# Patient Record
Sex: Male | Born: 1945 | Race: White | Hispanic: No | Marital: Married | Smoking: Never smoker
Health system: Southern US, Community
[De-identification: ages and names within clinical notes are randomized; demographics above are authoritative.]

## PROBLEM LIST (undated history)

## (undated) DIAGNOSIS — E785 Hyperlipidemia, unspecified: Secondary | ICD-10-CM

## (undated) DIAGNOSIS — T23209A Burn of second degree of unspecified hand, unspecified site, initial encounter: Secondary | ICD-10-CM

## (undated) DIAGNOSIS — T24202A Burn of second degree of unspecified site of left lower limb, except ankle and foot, initial encounter: Secondary | ICD-10-CM

## (undated) DIAGNOSIS — I69351 Hemiplegia and hemiparesis following cerebral infarction affecting right dominant side: Secondary | ICD-10-CM

## (undated) DIAGNOSIS — I639 Cerebral infarction, unspecified: Secondary | ICD-10-CM

## (undated) DIAGNOSIS — I10 Essential (primary) hypertension: Secondary | ICD-10-CM

## (undated) DIAGNOSIS — Z9282 Status post administration of tPA (rtPA) in a different facility within the last 24 hours prior to admission to current facility: Secondary | ICD-10-CM

## (undated) DIAGNOSIS — E1165 Type 2 diabetes mellitus with hyperglycemia: Secondary | ICD-10-CM

## (undated) DIAGNOSIS — J45909 Unspecified asthma, uncomplicated: Secondary | ICD-10-CM

## (undated) DIAGNOSIS — R1314 Dysphagia, pharyngoesophageal phase: Secondary | ICD-10-CM

## (undated) DIAGNOSIS — Z91148 Patient's other noncompliance with medication regimen for other reason: Secondary | ICD-10-CM

## (undated) DIAGNOSIS — I4891 Unspecified atrial fibrillation: Secondary | ICD-10-CM

## (undated) DIAGNOSIS — G8191 Hemiplegia, unspecified affecting right dominant side: Secondary | ICD-10-CM

## (undated) DIAGNOSIS — E1159 Type 2 diabetes mellitus with other circulatory complications: Secondary | ICD-10-CM

## (undated) DIAGNOSIS — R471 Dysarthria and anarthria: Secondary | ICD-10-CM

## (undated) DIAGNOSIS — I6330 Cerebral infarction due to thrombosis of unspecified cerebral artery: Secondary | ICD-10-CM

## (undated) DIAGNOSIS — Z91199 Patient's noncompliance with other medical treatment and regimen due to unspecified reason: Secondary | ICD-10-CM

## (undated) DIAGNOSIS — F329 Major depressive disorder, single episode, unspecified: Secondary | ICD-10-CM

## (undated) DIAGNOSIS — E119 Type 2 diabetes mellitus without complications: Secondary | ICD-10-CM

## (undated) HISTORY — DX: Hemiplegia and hemiparesis following cerebral infarction affecting right dominant side: I69.351

## (undated) HISTORY — DX: Burn of second degree of unspecified site of left lower limb, except ankle and foot, initial encounter: T24.202A

## (undated) HISTORY — DX: Unspecified asthma, uncomplicated: J45.909

## (undated) HISTORY — DX: Dysphagia, pharyngoesophageal phase: R13.14

## (undated) HISTORY — DX: Patient's noncompliance with other medical treatment and regimen due to unspecified reason: Z91.199

## (undated) HISTORY — DX: Status post administration of tPA (rtPA) in a different facility within the last 24 hours prior to admission to current facility: Z92.82

## (undated) HISTORY — DX: Burn of second degree of unspecified hand, unspecified site, initial encounter: T23.209A

## (undated) HISTORY — DX: Type 2 diabetes mellitus without complications: E11.9

## (undated) HISTORY — DX: Essential (primary) hypertension: I10

## (undated) HISTORY — DX: Unspecified atrial fibrillation: I48.91

## (undated) HISTORY — DX: Cerebral infarction, unspecified: I63.9

## (undated) HISTORY — DX: Type 2 diabetes mellitus with other circulatory complications: E11.65

## (undated) HISTORY — DX: Type 2 diabetes mellitus with other circulatory complications: E11.59

## (undated) HISTORY — DX: Patient's other noncompliance with medication regimen for other reason: Z91.148

## (undated) HISTORY — PX: NO PAST SURGERIES: SHX2092

## (undated) HISTORY — DX: Hemiplegia, unspecified affecting right dominant side: G81.91

## (undated) HISTORY — DX: Dysarthria and anarthria: R47.1

## (undated) HISTORY — DX: Major depressive disorder, single episode, unspecified: F32.9

## (undated) HISTORY — DX: Hyperlipidemia, unspecified: E78.5

## (undated) HISTORY — DX: Cerebral infarction due to thrombosis of unspecified cerebral artery: I63.30

## (undated) MED FILL — Iron Sucrose Inj 20 MG/ML (Fe Equiv): INTRAVENOUS | Qty: 10 | Status: AC

---

## 1999-09-14 ENCOUNTER — Encounter: Payer: Self-pay | Admitting: Family Medicine

## 1999-09-14 ENCOUNTER — Encounter: Admission: RE | Admit: 1999-09-14 | Discharge: 1999-09-14 | Payer: Self-pay | Admitting: Family Medicine

## 1999-09-17 ENCOUNTER — Encounter: Payer: Self-pay | Admitting: Family Medicine

## 1999-09-17 ENCOUNTER — Ambulatory Visit (HOSPITAL_COMMUNITY): Admission: RE | Admit: 1999-09-17 | Discharge: 1999-09-17 | Payer: Self-pay | Admitting: Family Medicine

## 1999-10-13 ENCOUNTER — Encounter: Payer: Self-pay | Admitting: Neurosurgery

## 1999-10-13 ENCOUNTER — Ambulatory Visit (HOSPITAL_COMMUNITY): Admission: RE | Admit: 1999-10-13 | Discharge: 1999-10-13 | Payer: Self-pay | Admitting: Neurosurgery

## 1999-12-21 ENCOUNTER — Ambulatory Visit (HOSPITAL_COMMUNITY): Admission: RE | Admit: 1999-12-21 | Discharge: 1999-12-21 | Payer: Self-pay | Admitting: Neurosurgery

## 1999-12-21 ENCOUNTER — Encounter: Payer: Self-pay | Admitting: Neurosurgery

## 2007-02-19 ENCOUNTER — Encounter: Admission: RE | Admit: 2007-02-19 | Discharge: 2007-02-19 | Payer: Self-pay | Admitting: Family Medicine

## 2008-09-09 ENCOUNTER — Emergency Department (HOSPITAL_COMMUNITY): Admission: EM | Admit: 2008-09-09 | Discharge: 2008-09-09 | Payer: Self-pay | Admitting: Emergency Medicine

## 2014-11-19 DIAGNOSIS — I10 Essential (primary) hypertension: Secondary | ICD-10-CM | POA: Diagnosis not present

## 2014-11-19 DIAGNOSIS — E119 Type 2 diabetes mellitus without complications: Secondary | ICD-10-CM | POA: Diagnosis not present

## 2014-11-19 DIAGNOSIS — Z0000 Encounter for general adult medical examination without abnormal findings: Secondary | ICD-10-CM | POA: Diagnosis not present

## 2014-11-19 DIAGNOSIS — Z1211 Encounter for screening for malignant neoplasm of colon: Secondary | ICD-10-CM | POA: Diagnosis not present

## 2014-11-19 DIAGNOSIS — Z1389 Encounter for screening for other disorder: Secondary | ICD-10-CM | POA: Diagnosis not present

## 2014-11-19 DIAGNOSIS — E780 Pure hypercholesterolemia: Secondary | ICD-10-CM | POA: Diagnosis not present

## 2014-12-24 DIAGNOSIS — Z1211 Encounter for screening for malignant neoplasm of colon: Secondary | ICD-10-CM | POA: Diagnosis not present

## 2015-05-20 DIAGNOSIS — E119 Type 2 diabetes mellitus without complications: Secondary | ICD-10-CM | POA: Diagnosis not present

## 2015-05-20 DIAGNOSIS — E7800 Pure hypercholesterolemia, unspecified: Secondary | ICD-10-CM | POA: Diagnosis not present

## 2015-05-20 DIAGNOSIS — E559 Vitamin D deficiency, unspecified: Secondary | ICD-10-CM | POA: Diagnosis not present

## 2015-05-20 DIAGNOSIS — I10 Essential (primary) hypertension: Secondary | ICD-10-CM | POA: Diagnosis not present

## 2015-07-09 DIAGNOSIS — H31001 Unspecified chorioretinal scars, right eye: Secondary | ICD-10-CM | POA: Diagnosis not present

## 2015-07-09 DIAGNOSIS — E119 Type 2 diabetes mellitus without complications: Secondary | ICD-10-CM | POA: Diagnosis not present

## 2015-07-09 DIAGNOSIS — H3562 Retinal hemorrhage, left eye: Secondary | ICD-10-CM | POA: Diagnosis not present

## 2015-07-09 DIAGNOSIS — H2513 Age-related nuclear cataract, bilateral: Secondary | ICD-10-CM | POA: Diagnosis not present

## 2015-09-22 DIAGNOSIS — L03012 Cellulitis of left finger: Secondary | ICD-10-CM | POA: Diagnosis not present

## 2015-11-23 DIAGNOSIS — Z0000 Encounter for general adult medical examination without abnormal findings: Secondary | ICD-10-CM | POA: Diagnosis not present

## 2015-11-23 DIAGNOSIS — E7800 Pure hypercholesterolemia, unspecified: Secondary | ICD-10-CM | POA: Diagnosis not present

## 2015-11-23 DIAGNOSIS — E1165 Type 2 diabetes mellitus with hyperglycemia: Secondary | ICD-10-CM | POA: Diagnosis not present

## 2015-11-23 DIAGNOSIS — Z125 Encounter for screening for malignant neoplasm of prostate: Secondary | ICD-10-CM | POA: Diagnosis not present

## 2015-11-23 DIAGNOSIS — Z1211 Encounter for screening for malignant neoplasm of colon: Secondary | ICD-10-CM | POA: Diagnosis not present

## 2015-11-23 DIAGNOSIS — Z1159 Encounter for screening for other viral diseases: Secondary | ICD-10-CM | POA: Diagnosis not present

## 2015-11-23 DIAGNOSIS — Z1389 Encounter for screening for other disorder: Secondary | ICD-10-CM | POA: Diagnosis not present

## 2015-11-23 DIAGNOSIS — I10 Essential (primary) hypertension: Secondary | ICD-10-CM | POA: Diagnosis not present

## 2015-11-23 DIAGNOSIS — E559 Vitamin D deficiency, unspecified: Secondary | ICD-10-CM | POA: Diagnosis not present

## 2016-02-24 DIAGNOSIS — E11319 Type 2 diabetes mellitus with unspecified diabetic retinopathy without macular edema: Secondary | ICD-10-CM | POA: Diagnosis not present

## 2016-02-24 DIAGNOSIS — E1165 Type 2 diabetes mellitus with hyperglycemia: Secondary | ICD-10-CM | POA: Diagnosis not present

## 2016-02-24 DIAGNOSIS — I10 Essential (primary) hypertension: Secondary | ICD-10-CM | POA: Diagnosis not present

## 2016-02-24 DIAGNOSIS — E7800 Pure hypercholesterolemia, unspecified: Secondary | ICD-10-CM | POA: Diagnosis not present

## 2016-05-26 DIAGNOSIS — Z125 Encounter for screening for malignant neoplasm of prostate: Secondary | ICD-10-CM | POA: Diagnosis not present

## 2016-05-26 DIAGNOSIS — E782 Mixed hyperlipidemia: Secondary | ICD-10-CM | POA: Diagnosis not present

## 2016-05-26 DIAGNOSIS — H6123 Impacted cerumen, bilateral: Secondary | ICD-10-CM | POA: Diagnosis not present

## 2016-05-26 DIAGNOSIS — E119 Type 2 diabetes mellitus without complications: Secondary | ICD-10-CM | POA: Diagnosis not present

## 2016-05-26 DIAGNOSIS — S60551A Superficial foreign body of right hand, initial encounter: Secondary | ICD-10-CM | POA: Diagnosis not present

## 2016-06-15 DIAGNOSIS — E119 Type 2 diabetes mellitus without complications: Secondary | ICD-10-CM | POA: Diagnosis not present

## 2016-06-15 DIAGNOSIS — S335XXA Sprain of ligaments of lumbar spine, initial encounter: Secondary | ICD-10-CM | POA: Diagnosis not present

## 2016-06-15 DIAGNOSIS — K047 Periapical abscess without sinus: Secondary | ICD-10-CM | POA: Diagnosis not present

## 2016-06-15 DIAGNOSIS — Z1389 Encounter for screening for other disorder: Secondary | ICD-10-CM | POA: Diagnosis not present

## 2016-07-14 DIAGNOSIS — E113292 Type 2 diabetes mellitus with mild nonproliferative diabetic retinopathy without macular edema, left eye: Secondary | ICD-10-CM | POA: Diagnosis not present

## 2016-11-28 DIAGNOSIS — E782 Mixed hyperlipidemia: Secondary | ICD-10-CM | POA: Diagnosis not present

## 2016-11-28 DIAGNOSIS — E119 Type 2 diabetes mellitus without complications: Secondary | ICD-10-CM | POA: Diagnosis not present

## 2016-11-29 DIAGNOSIS — E782 Mixed hyperlipidemia: Secondary | ICD-10-CM | POA: Diagnosis not present

## 2016-11-29 DIAGNOSIS — E1121 Type 2 diabetes mellitus with diabetic nephropathy: Secondary | ICD-10-CM | POA: Diagnosis not present

## 2017-03-01 DIAGNOSIS — H6123 Impacted cerumen, bilateral: Secondary | ICD-10-CM | POA: Diagnosis not present

## 2017-03-01 DIAGNOSIS — E1121 Type 2 diabetes mellitus with diabetic nephropathy: Secondary | ICD-10-CM | POA: Diagnosis not present

## 2017-03-01 DIAGNOSIS — E782 Mixed hyperlipidemia: Secondary | ICD-10-CM | POA: Diagnosis not present

## 2017-03-08 DIAGNOSIS — H6123 Impacted cerumen, bilateral: Secondary | ICD-10-CM | POA: Diagnosis not present

## 2017-06-26 DIAGNOSIS — E782 Mixed hyperlipidemia: Secondary | ICD-10-CM | POA: Diagnosis not present

## 2017-06-26 DIAGNOSIS — E1121 Type 2 diabetes mellitus with diabetic nephropathy: Secondary | ICD-10-CM | POA: Diagnosis not present

## 2017-07-13 DIAGNOSIS — E119 Type 2 diabetes mellitus without complications: Secondary | ICD-10-CM | POA: Diagnosis not present

## 2017-08-16 DIAGNOSIS — Z0000 Encounter for general adult medical examination without abnormal findings: Secondary | ICD-10-CM | POA: Diagnosis not present

## 2017-08-16 DIAGNOSIS — Z125 Encounter for screening for malignant neoplasm of prostate: Secondary | ICD-10-CM | POA: Diagnosis not present

## 2017-08-16 DIAGNOSIS — Z6832 Body mass index (BMI) 32.0-32.9, adult: Secondary | ICD-10-CM | POA: Diagnosis not present

## 2017-08-16 DIAGNOSIS — Z1211 Encounter for screening for malignant neoplasm of colon: Secondary | ICD-10-CM | POA: Diagnosis not present

## 2017-09-06 DIAGNOSIS — Z1212 Encounter for screening for malignant neoplasm of rectum: Secondary | ICD-10-CM | POA: Diagnosis not present

## 2017-09-06 DIAGNOSIS — Z1211 Encounter for screening for malignant neoplasm of colon: Secondary | ICD-10-CM | POA: Diagnosis not present

## 2017-09-26 DIAGNOSIS — E782 Mixed hyperlipidemia: Secondary | ICD-10-CM | POA: Diagnosis not present

## 2017-09-26 DIAGNOSIS — E1121 Type 2 diabetes mellitus with diabetic nephropathy: Secondary | ICD-10-CM | POA: Diagnosis not present

## 2017-09-26 DIAGNOSIS — Z125 Encounter for screening for malignant neoplasm of prostate: Secondary | ICD-10-CM | POA: Diagnosis not present

## 2018-07-16 DIAGNOSIS — H2513 Age-related nuclear cataract, bilateral: Secondary | ICD-10-CM | POA: Diagnosis not present

## 2018-07-16 DIAGNOSIS — H52221 Regular astigmatism, right eye: Secondary | ICD-10-CM | POA: Diagnosis not present

## 2018-07-16 DIAGNOSIS — E119 Type 2 diabetes mellitus without complications: Secondary | ICD-10-CM | POA: Diagnosis not present

## 2018-07-16 DIAGNOSIS — H5201 Hypermetropia, right eye: Secondary | ICD-10-CM | POA: Diagnosis not present

## 2018-07-16 DIAGNOSIS — H524 Presbyopia: Secondary | ICD-10-CM | POA: Diagnosis not present

## 2018-11-23 DIAGNOSIS — I639 Cerebral infarction, unspecified: Secondary | ICD-10-CM | POA: Insufficient documentation

## 2018-11-23 HISTORY — DX: Cerebral infarction, unspecified: I63.9

## 2018-12-16 DIAGNOSIS — R29705 NIHSS score 5: Secondary | ICD-10-CM | POA: Diagnosis not present

## 2018-12-16 DIAGNOSIS — I700 Atherosclerosis of aorta: Secondary | ICD-10-CM | POA: Diagnosis not present

## 2018-12-16 DIAGNOSIS — R0902 Hypoxemia: Secondary | ICD-10-CM | POA: Diagnosis not present

## 2018-12-16 DIAGNOSIS — E1165 Type 2 diabetes mellitus with hyperglycemia: Secondary | ICD-10-CM | POA: Diagnosis not present

## 2018-12-16 DIAGNOSIS — R29810 Facial weakness: Secondary | ICD-10-CM | POA: Diagnosis not present

## 2018-12-16 DIAGNOSIS — R4781 Slurred speech: Secondary | ICD-10-CM | POA: Diagnosis not present

## 2018-12-16 DIAGNOSIS — R531 Weakness: Secondary | ICD-10-CM | POA: Diagnosis not present

## 2018-12-16 DIAGNOSIS — I6312 Cerebral infarction due to embolism of basilar artery: Secondary | ICD-10-CM | POA: Diagnosis not present

## 2018-12-16 DIAGNOSIS — R42 Dizziness and giddiness: Secondary | ICD-10-CM | POA: Diagnosis not present

## 2018-12-16 DIAGNOSIS — R29818 Other symptoms and signs involving the nervous system: Secondary | ICD-10-CM | POA: Diagnosis not present

## 2018-12-17 ENCOUNTER — Inpatient Hospital Stay (HOSPITAL_COMMUNITY): Payer: Medicare HMO

## 2018-12-17 ENCOUNTER — Inpatient Hospital Stay (HOSPITAL_COMMUNITY)
Admission: AD | Admit: 2018-12-17 | Discharge: 2018-12-24 | DRG: 065 | Disposition: A | Discharge status: Rehabilitation Facility | Payer: Medicare HMO | Source: Other Acute Inpatient Hospital | Attending: Neurology | Admitting: Neurology

## 2018-12-17 ENCOUNTER — Encounter (HOSPITAL_COMMUNITY): Payer: Self-pay

## 2018-12-17 DIAGNOSIS — I6350 Cerebral infarction due to unspecified occlusion or stenosis of unspecified cerebral artery: Secondary | ICD-10-CM | POA: Diagnosis not present

## 2018-12-17 DIAGNOSIS — K5900 Constipation, unspecified: Secondary | ICD-10-CM | POA: Diagnosis not present

## 2018-12-17 DIAGNOSIS — I6312 Cerebral infarction due to embolism of basilar artery: Secondary | ICD-10-CM | POA: Diagnosis not present

## 2018-12-17 DIAGNOSIS — R29710 NIHSS score 10: Secondary | ICD-10-CM | POA: Diagnosis not present

## 2018-12-17 DIAGNOSIS — R739 Hyperglycemia, unspecified: Secondary | ICD-10-CM | POA: Diagnosis not present

## 2018-12-17 DIAGNOSIS — I69392 Facial weakness following cerebral infarction: Secondary | ICD-10-CM | POA: Diagnosis not present

## 2018-12-17 DIAGNOSIS — Z8249 Family history of ischemic heart disease and other diseases of the circulatory system: Secondary | ICD-10-CM

## 2018-12-17 DIAGNOSIS — Z9282 Status post administration of tPA (rtPA) in a different facility within the last 24 hours prior to admission to current facility: Secondary | ICD-10-CM | POA: Diagnosis not present

## 2018-12-17 DIAGNOSIS — E1165 Type 2 diabetes mellitus with hyperglycemia: Secondary | ICD-10-CM | POA: Diagnosis not present

## 2018-12-17 DIAGNOSIS — E11649 Type 2 diabetes mellitus with hypoglycemia without coma: Secondary | ICD-10-CM | POA: Diagnosis not present

## 2018-12-17 DIAGNOSIS — E785 Hyperlipidemia, unspecified: Secondary | ICD-10-CM | POA: Diagnosis not present

## 2018-12-17 DIAGNOSIS — R29810 Facial weakness: Secondary | ICD-10-CM | POA: Diagnosis present

## 2018-12-17 DIAGNOSIS — Z9119 Patient's noncompliance with other medical treatment and regimen: Secondary | ICD-10-CM | POA: Diagnosis not present

## 2018-12-17 DIAGNOSIS — F329 Major depressive disorder, single episode, unspecified: Secondary | ICD-10-CM | POA: Diagnosis not present

## 2018-12-17 DIAGNOSIS — R471 Dysarthria and anarthria: Secondary | ICD-10-CM | POA: Diagnosis present

## 2018-12-17 DIAGNOSIS — I6330 Cerebral infarction due to thrombosis of unspecified cerebral artery: Secondary | ICD-10-CM

## 2018-12-17 DIAGNOSIS — I639 Cerebral infarction, unspecified: Secondary | ICD-10-CM | POA: Diagnosis not present

## 2018-12-17 DIAGNOSIS — E669 Obesity, unspecified: Secondary | ICD-10-CM | POA: Diagnosis present

## 2018-12-17 DIAGNOSIS — Z8042 Family history of malignant neoplasm of prostate: Secondary | ICD-10-CM

## 2018-12-17 DIAGNOSIS — R1314 Dysphagia, pharyngoesophageal phase: Secondary | ICD-10-CM | POA: Diagnosis not present

## 2018-12-17 DIAGNOSIS — R001 Bradycardia, unspecified: Secondary | ICD-10-CM | POA: Diagnosis not present

## 2018-12-17 DIAGNOSIS — Z1159 Encounter for screening for other viral diseases: Secondary | ICD-10-CM

## 2018-12-17 DIAGNOSIS — Z9114 Patient's other noncompliance with medication regimen: Secondary | ICD-10-CM

## 2018-12-17 DIAGNOSIS — Z833 Family history of diabetes mellitus: Secondary | ICD-10-CM

## 2018-12-17 DIAGNOSIS — G8191 Hemiplegia, unspecified affecting right dominant side: Secondary | ICD-10-CM | POA: Diagnosis not present

## 2018-12-17 DIAGNOSIS — I69351 Hemiplegia and hemiparesis following cerebral infarction affecting right dominant side: Secondary | ICD-10-CM | POA: Diagnosis not present

## 2018-12-17 DIAGNOSIS — E871 Hypo-osmolality and hyponatremia: Secondary | ICD-10-CM | POA: Diagnosis present

## 2018-12-17 DIAGNOSIS — Z6829 Body mass index (BMI) 29.0-29.9, adult: Secondary | ICD-10-CM | POA: Diagnosis not present

## 2018-12-17 DIAGNOSIS — I10 Essential (primary) hypertension: Secondary | ICD-10-CM | POA: Diagnosis not present

## 2018-12-17 DIAGNOSIS — Z91199 Patient's noncompliance with other medical treatment and regimen due to unspecified reason: Secondary | ICD-10-CM

## 2018-12-17 DIAGNOSIS — E119 Type 2 diabetes mellitus without complications: Secondary | ICD-10-CM | POA: Diagnosis not present

## 2018-12-17 DIAGNOSIS — Z5329 Procedure and treatment not carried out because of patient's decision for other reasons: Secondary | ICD-10-CM

## 2018-12-17 DIAGNOSIS — I69322 Dysarthria following cerebral infarction: Secondary | ICD-10-CM | POA: Diagnosis not present

## 2018-12-17 DIAGNOSIS — I6389 Other cerebral infarction: Secondary | ICD-10-CM | POA: Diagnosis not present

## 2018-12-17 HISTORY — DX: Cerebral infarction, unspecified: I63.9

## 2018-12-17 LAB — POCT I-STAT, CHEM 8
BUN: 21 mg/dL (ref 8–23)
Calcium, Ion: 1.16 mmol/L (ref 1.15–1.40)
Chloride: 99 mmol/L (ref 98–111)
Creatinine, Ser: 0.7 mg/dL (ref 0.61–1.24)
Glucose, Bld: 394 mg/dL — ABNORMAL HIGH (ref 70–99)
HCT: 40 % (ref 39.0–52.0)
Hemoglobin: 13.6 g/dL (ref 13.0–17.0)
Potassium: 4.3 mmol/L (ref 3.5–5.1)
Sodium: 134 mmol/L — ABNORMAL LOW (ref 135–145)
TCO2: 24 mmol/L (ref 22–32)

## 2018-12-17 LAB — GLUCOSE, CAPILLARY
Glucose-Capillary: 340 mg/dL — ABNORMAL HIGH (ref 70–99)
Glucose-Capillary: 295 mg/dL — ABNORMAL HIGH (ref 70–99)
Glucose-Capillary: 305 mg/dL — ABNORMAL HIGH (ref 70–99)
Glucose-Capillary: 311 mg/dL — ABNORMAL HIGH (ref 70–99)
Glucose-Capillary: 448 mg/dL — ABNORMAL HIGH (ref 70–99)

## 2018-12-17 LAB — LIPID PANEL
Cholesterol: 227 mg/dL — ABNORMAL HIGH (ref 0–200)
HDL: 45 mg/dL (ref >40)
LDL Cholesterol: 166 mg/dL — ABNORMAL HIGH (ref 0–99)
Total CHOL/HDL Ratio: 5 RATIO
Triglycerides: 81 mg/dL (ref <150)
VLDL: 16 mg/dL (ref 0–40)

## 2018-12-17 LAB — BASIC METABOLIC PANEL
Anion gap: 13 (ref 5–15)
BUN: 19 mg/dL (ref 8–23)
CO2: 24 mmol/L (ref 22–32)
Calcium: 8.9 mg/dL (ref 8.9–10.3)
Chloride: 97 mmol/L — ABNORMAL LOW (ref 98–111)
Creatinine, Ser: 0.79 mg/dL (ref 0.61–1.24)
GFR calc Af Amer: >60 mL/min (ref >60)
GFR calc non Af Amer: >60 mL/min (ref >60)
Glucose, Bld: 383 mg/dL — ABNORMAL HIGH (ref 70–99)
Potassium: 3.9 mmol/L (ref 3.5–5.1)
Sodium: 134 mmol/L — ABNORMAL LOW (ref 135–145)

## 2018-12-17 LAB — ECHOCARDIOGRAM COMPLETE BUBBLE STUDY
Height: 71 in
Weight: 3400.38 oz

## 2018-12-17 LAB — MRSA PCR SCREENING: MRSA by PCR: NEGATIVE

## 2018-12-17 MED ORDER — ASPIRIN EC 81 MG PO TBEC
81.0000 mg | DELAYED_RELEASE_TABLET | Freq: Every day | ORAL | Status: DC
  Administered 2018-12-17 – 2018-12-24 (×8): 81 mg via ORAL
  Filled 2018-12-17 (×8): qty 1

## 2018-12-17 MED ORDER — INSULIN ASPART 100 UNIT/ML ~~LOC~~ SOLN: 0.0000 [IU] | Freq: Every day | SUBCUTANEOUS | Status: DC

## 2018-12-17 MED ORDER — ACETAMINOPHEN 650 MG RE SUPP: 650.0000 mg | RECTAL | Status: DC | PRN

## 2018-12-17 MED ORDER — ACETAMINOPHEN 160 MG/5ML PO SOLN: 650.0000 mg | ORAL | Status: DC | PRN

## 2018-12-17 MED ORDER — STROKE: EARLY STAGES OF RECOVERY BOOK
Freq: Once | Status: DC
  Filled 2018-12-17: qty 1

## 2018-12-17 MED ORDER — ACETAMINOPHEN 325 MG PO TABS: 650.0000 mg | ORAL_TABLET | ORAL | Status: DC | PRN

## 2018-12-17 MED ORDER — SENNOSIDES-DOCUSATE SODIUM 8.6-50 MG PO TABS
1.0000 | ORAL_TABLET | Freq: Every evening | ORAL | Status: DC | PRN
  Administered 2018-12-21: 1 via ORAL
  Filled 2018-12-17: qty 1

## 2018-12-17 MED ORDER — PANTOPRAZOLE SODIUM 40 MG IV SOLR
40.0000 mg | Freq: Every day | INTRAVENOUS | Status: DC
  Administered 2018-12-17 – 2018-12-18 (×2): 40 mg via INTRAVENOUS
  Filled 2018-12-17 (×3): qty 40

## 2018-12-17 MED ORDER — INSULIN ASPART 100 UNIT/ML ~~LOC~~ SOLN
0.0000 [IU] | Freq: Three times a day (TID) | SUBCUTANEOUS | Status: DC
  Administered 2018-12-17 – 2018-12-18 (×2): 9 [IU] via SUBCUTANEOUS
  Administered 2018-12-18: 07:00:00 3 [IU] via SUBCUTANEOUS
  Administered 2018-12-18 – 2018-12-20 (×4): 7 [IU] via SUBCUTANEOUS
  Administered 2018-12-20: 5 [IU] via SUBCUTANEOUS
  Administered 2018-12-20: 3 [IU] via SUBCUTANEOUS
  Administered 2018-12-21: 5 [IU] via SUBCUTANEOUS

## 2018-12-17 MED ORDER — CHLORHEXIDINE GLUCONATE CLOTH 2 % EX PADS
6.0000 | MEDICATED_PAD | Freq: Every day | CUTANEOUS | Status: DC
  Administered 2018-12-18: 6 via TOPICAL

## 2018-12-17 MED ORDER — IOHEXOL 350 MG/ML SOLN
75.0000 mL | Freq: Once | INTRAVENOUS | Status: AC | PRN
  Administered 2018-12-17: 75 mL via INTRAVENOUS

## 2018-12-17 MED ORDER — SODIUM CHLORIDE 0.9 % IV SOLN
INTRAVENOUS | Status: DC
  Administered 2018-12-17: 07:00:00 via INTRAVENOUS

## 2018-12-17 NOTE — Evaluation (Signed)
Speech Language Pathology Evaluation Patient Details Name: Robert Brewer MRN: [id number] DOB: [birth year] Today's Date: 12/17/2018 Time: 4967-5916 SLP Time Calculation (min) (ACUTE ONLY): 21 min  Problem List:  Patient Active Problem List   Diagnosis Date Noted  . Ischemic cerebrovascular accident (CVA) ([place]) 12/17/2018   Past Medical History: History reviewed. No pertinent past medical history. Past Surgical History: History reviewed. No pertinent surgical history. HPI:  Pt is a 73 y.o. male with past medical history of diabetes mellitus, HTN not compliant with medications who presented to the emergency department with right-sided weakness and facial droop. CT of the head revealed subtle approximate 1 cm hypodensity involving the left paramedian pons, increased in prominence from previous, and suspicious for evolving acute ischemic infarct. IV tPA was administered and he presented with severe dysarthria when he was seen by neurology.    Assessment / Plan / Recommendation Clinical Impression  Pt expressed that he was living independently with his wife prior to admission. He denied any baseline or new deficits in language or cognition but stated that his speech is now "slurred" with a 50% return to baseline. His language and cognitive-linguistic skills were within normal limits during the assessment. However, he presented with moderate to severe dysarthria secondary to moderate labial and lingual weakness and characterized by reduced articulatory precision, reduced vocal intensity, and a reduced ability to adequately coordinate respiration with speech. Skilled SLP services are clinically indicated at this time to improve motor speech function. Pt, and nursing were educated regarding results and recommendations; both parties verbalized understanding as well as agreement with plan of care.    SLP Assessment  SLP Recommendation/Assessment: Patient needs continued Speech Lanaguage Pathology  Services SLP Visit Diagnosis: Dysarthria and anarthria (R47.1)    Follow Up Recommendations  Other (comment)(Continued SLP at level of care recommended by PT/OT)    Frequency and Duration min 2x/week  2 weeks      SLP Evaluation Cognition  Overall Cognitive Status: Within Functional Limits for tasks assessed Arousal/Alertness: Awake/alert Orientation Level: Oriented X4 Attention: Focused;Sustained Focused Attention: Appears intact Sustained Attention: Appears intact Memory: Appears intact(Immediate: 3/3; Delayed: 3/3) Awareness: Appears intact Problem Solving: Appears intact(5/5) Executive Function: Sequencing Sequencing: Appears intact       Comprehension  Auditory Comprehension Overall Auditory Comprehension: Appears within functional limits for tasks assessed Yes/No Questions: Within Functional Limits Basic Biographical Questions: (5/5) Complex Questions: (5/5) Paragraph Comprehension (via yes/no questions): (4/4) Commands: Within Functional Limits Two Step Basic Commands: (4/4) Multistep Basic Commands: (4/4) Conversation: Complex Visual Recognition/Discrimination Discrimination: Within Function Limits    Expression Expression Primary Mode of Expression: Verbal Verbal Expression Overall Verbal Expression: Appears within functional limits for tasks assessed Initiation: No impairment Automatic Speech: Counting;Day of week;Month of year Level of Generative/Spontaneous Verbalization: Sentence;Conversation Repetition: No impairment(5/5) Naming: No impairment Responsive: (5/5) Confrontation: Within functional limits(10/10) Convergent: (Sentence completion: 5/5) Pragmatics: No impairment   Oral / Motor  Oral Motor/Sensory Function Overall Oral Motor/Sensory Function: Moderate impairment Facial ROM: Reduced right;Suspected CN VII (facial) dysfunction Facial Symmetry: Abnormal symmetry right;Suspected CN VII (facial) dysfunction Facial Strength: Reduced  right;Suspected CN VII (facial) dysfunction Facial Sensation: Within Functional Limits Lingual ROM: Reduced right;Suspected CN XII (hypoglossal) dysfunction Lingual Symmetry: Abnormal symmetry right;Suspected CN XII (hypoglossal) dysfunction Lingual Strength: Reduced;Suspected CN XII (hypoglossal) dysfunction Lingual Sensation: Within Functional Limits Motor Speech Overall Motor Speech: Impaired Respiration: Within functional limits Phonation: Breathy;Low vocal intensity Resonance: Within functional limits Articulation: Impaired Level of Impairment: Sentence Intelligibility: Intelligibility reduced Word: 75-100% accurate Phrase: 50-74%  accurate Sentence: 50-74% accurate Conversation: 25-49% accurate Motor Planning: Witnin functional limits Motor Speech Errors: Not applicable   Robert Brewer, [place], [place] Office number [phone] Pager [phone]                    Horton Marshall 12/17/2018, 3:32 PM

## 2018-12-17 NOTE — H&P (Addendum)
Chief Complaint: Sudden onset right side weakness  And facial droop   History obtained from: Patient and Chart ( from [facility])  HPI:                                                                                                                                       Robert Brewer is a 73 y.o. male with past medical history of diabetes mellitus, HTN not compliant medications presents to the emergency department with right-sided weakness and facial droop that began at 5 PM.  Patient was mowing his lawn when his symptoms started, however his symptoms worsened and his wife called 911.  History obtained by chart review from [facility] notes  Patient was evaluated by teleneurology at [facility] and patient received IV TPA. CT head was performed which showed no hemorrhage and aspects 10.  CT angiogram was performed which was read as negative for large vessel occlusion. 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   Patient arrived to [facility] around 1:30 AM.  On examination patient is alert but has severe dysarthria, right-sided hemiplegia.  NIH stroke scale 10, worse compared to presentation at [facility].  Other Work-up in ED included CBC: No leukocytosis normal platelets (188k).  INR 1.0.  SARS-CoV-2 test negative. Chest x-ray was normal.  EKG shows sinus bradycardia.   Date last known well: 5.24.20 Time last known well: 5 pm tPA Given: Yes NIHSS: 5>>10 Baseline MRS 0  PMH Diabetes mellitus    No family history on file. Family history of DM Social History: denies Smoking  Allergies: Allergies not on file  Medications:                                                                                                                        I reviewed home medications. Not compliant and not taking meds.    ROS:  14 systems reviewed and negative except above    Examination:                                                                                                      General: Appears well-developed  Psych: Affect appropriate to situation Eyes: No scleral injection HENT: No OP obstrucion Head: Normocephalic.  Cardiovascular: Normal rate and regular rhythm.  Respiratory: Effort normal and breath sounds normal to anterior ascultation GI: Soft.  No distension. There is no tenderness.  Skin: WDI    Neurological Examination Mental Status: Alert, oriented, thought content appropriate.  Significant dysarthria.  Language is intact to comprehension, repetition and naming.  Able to follow complex commands. Cranial Nerves: II: Visual fields normal,  III,IV, VI: ptosis not present, extra-ocular motions intact bilaterally, pupils equal, round, reactive to light and accommodation V,VII: Right Facial droop, facial light touch sensation normal bilaterally VIII: hearing normal bilaterally IX,X: uvula rises symmetrically XI: bilateral shoulder shrug XII: tongue extension ? Mildly deviated to right Motor: Right : Upper extremity   0/5    Left:     Upper extremity   5/5  Lower extremity   2/5     Lower extremity   5/5 Tone and bulk:normal tone throughout; no atrophy noted Sensory: Pinprick and light touch intact throughout, bilaterally Deep Tendon Reflexes: 2+ and symmetric throughout Plantars: Right: extensor  Left: downgoing Cerebellar: normal finger-to-nose on left side Gait: Unable to perform due to safety,      Lab Results: Basic Metabolic Panel: No results for input(s): NA, K, CL, CO2, GLUCOSE, BUN, CREATININE, CALCIUM, MG, PHOS in the last 168 hours.  CBC: No results for input(s): WBC, NEUTROABS, HGB, HCT, MCV, PLT in the last 168 hours.  Coagulation  Studies: No results for input(s): LABPROT, INR in the last 72 hours.  Imaging: No results found.  Reviewed CT angiogram, patient has been basilar nonocclusive thrombus.  Discussed this with neuroradiology who felt this was causing about 50 % to 70% stenosis, thrombus likely due to ulcerated plaque.   ASSESSMENT AND PLAN   73 y.o. male with past medical history of diabetes mellitus presents to the emergency department with right-sided weakness and facial droop that began at 5 PM. Patient presented to [facility] as symptoms worsened about 3 hours later, received IV TPA.   Transferred to [facility] on arrival NIH stroke scale had increased to 10.(NIH stroke scale was only 5 according to teleneurology note, per ED mentions patient has dense hemiparesis).  As patient's stroke scale had increased and CTA at outside hospital showed basilar thrombus, we repeated the CT angiogram after confirming creatinine was normal.  No hemorrhage seen on CT, basilar artery remained open.  Acute Ischemic Stroke s/p IV tPA  Plan # MRI of the brain without contrast #Transthoracic Echo  # Hold ASA until 24hr post tPA, 24hr CT scan ordered  #Start or continue Atorvastatin 40 mg/other high intensity statin after passing swallow  # BP goal: permissive HTN upto 180/105 mmHg # HBAIC and Lipid profile # Telemetry monitoring # Frequent neuro checks #  NPO until passes stroke swallow screen   Type II diabetes Mellitus, uncontrolled 342 RBG Sliding scale insulin ordered  Hyponatremia - patient started on normal saline  At risk of AKI due to contrast for CT angiogram -IV fluids, avoid nephrotoxic agents  DVT prophylaxis: SCD Full code  Please page stroke NP  Or  PA  Or MD from 8am -4 pm  as this patient from this time will be  followed by the stroke.   You can look them up on [email or web address]  Password [email or web address]    This patient is neurologically critically ill due to stroke s/p TPA. He is at risk for  significant risk of neurological worsening from cerebral edema,  death from brain herniation, heart failure, hemorrhagic conversion, infection, respiratory failure and seizure. This patient's care requires constant monitoring of vital signs, hemodynamics, respiratory and cardiac monitoring, review of multiple databases, neurological assessment, discussion with family, other specialists and medical decision making of high complexity.  I spent 70  minutes of neurocritical time in the care of this patient.      Eldean Nanna [facility] Pager Number [phone]

## 2018-12-17 NOTE — Progress Notes (Signed)
STROKE TEAM PROGRESS NOTE   HISTORY OF PRESENT ILLNESS (per record) Robert Brewer is a 73 y.o. male with past medical history of diabetes mellitus and HTN but not compliant medications presents to the emergency department with right-sided weakness and facial droop that began at 5 PM.  Patient was mowing his lawn when his symptoms started, however his symptoms worsened and his wife called 911.  History obtained by chart review from [facility] notes  Patient was evaluated by teleneurology at [facility] and patient received IV TPA. CT head was performed which showed no hemorrhage and aspects 10.  CT angiogram was performed which was read as negative for large vessel occlusion. It appears  At 8:50 PM, telemetry neurologist was informed that patient may have non-occlusive mid basilar nonocclusive thrombus.( not informed to Accepting neurologist at [facility]).   Patient arrived to [facility] around 1:30 AM.  On examination patient is alert but has severe dysarthria, right-sided hemiplegia.  NIH stroke scale 10, worse compared to presentation at [facility].  Other Work-up in ED included CBC: No leukocytosis normal platelets (188k).  INR 1.0.  SARS-CoV-2 test negative. Chest x-ray was normal.  EKG shows sinus bradycardia.   Date last known well: 5.24.20 Time last known well: 5 pm tPA Given: Yes [facility] NIHSS: 5>>10 Baseline MRS 0   SUBJECTIVE (INTERVAL HISTORY) His nurse is at bedside. He is declining Conservation officer, nature.  Not compliant with medications. Patient dysarthric but not aphasic, appears to understand that he has had a stroke, explained the cause for the stroke and the need to treat his medical conditions. He still declined any insulin or other medical management.   OBJECTIVE Vitals:   12/17/18 0530 12/17/18 0600 12/17/18 0630 12/17/18 0700  BP: 138/75 (!) 160/96 (!) 141/74 128/66  Pulse: (!) 52 82 61 (!) 57  Resp: 14 (!) 21 15 13   Temp:       SpO2: 96% 96% 94% 92%  Weight:      Height:        CBC:  Recent Labs  Lab 12/17/18 0448  HGB 13.6  HCT 03.5    Basic Metabolic Panel:  Recent Labs  Lab 12/17/18 0448 12/17/18 0454  NA 134* 134*  K 4.3 3.9  CL 99 97*  CO2  --  24  GLUCOSE 394* 383*  BUN 21 19  CREATININE 0.70 0.79  CALCIUM  --  8.9    Lipid Panel:     Component Value Date/Time   CHOL 227 (H) 12/17/2018 0212   TRIG 81 12/17/2018 0212   HDL 45 12/17/2018 0212   CHOLHDL 5.0 12/17/2018 0212   VLDL 16 12/17/2018 0212   LDLCALC 166 (H) 12/17/2018 0212   HgbA1c: No results found for: HGBA1C Urine Drug Screen: No results found for: LABOPIA, COCAINSCRNUR, LABBENZ, AMPHETMU, THCU, LABBARB  Alcohol Level No results found for: ETH  IMAGING  MRI Wo Contrast - pending  CT Head Wo Contrast - pending  Ct Angio Head W Or Wo Contrast 12/17/2018 IMPRESSION:   CT HEAD 1. Subtle approximate 1 cm hypodensity involving the left paramedian pons, increased in prominence from previous, and suspicious for evolving acute ischemic infarct.  2. Otherwise stable appearance of the head with associated age-related cerebral atrophy. No other acute abnormality identified.   CTA HEAD AND NECK Ct Angio Neck W Or Wo Contrast 1. 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 No significant intraluminal thrombus or raised dissection flap identified.  2. No large vessel occlusion. No other hemodynamically significant or correctable stenosis.   CXR - normal at OSH   Transthoracic Echocardiogram  00/00/2020 Pending   EKG - SB at OSH   PHYSICAL EXAM Blood pressure 128/66, pulse (!) 57, temperature 97.7 F (36.5 C), resp. rate 13, height 5\' 11"  (1.803 m), weight 96.4 kg, SpO2 92 %.   Physical exam: Exam: Gen: NAD, conversant, well nourised, obese, well groomed                      CV: RRR, no MRG. No Carotid Bruits. No peripheral edema, warm, nontender Eyes: Conjunctivae clear without exudates or hemorrhage  Neuro: Detailed Neurologic Exam  Speech:    Speech is dysarthric, not aphasic  Cognition:    Following commands Cranial Nerves:    The pupils are equal, round, and reactive to light. Appears to have  right gaze preference but VF intact to finger confrontation, EOMI. Trigeminal sensation is intact and the muscles of mastication are normal. Right facial droop. The palate elevates in the midline. Hearing intact. Voice is normal. Shoulder shrug is normal. The tongue has normal motion without fasciculations.   Coordination:    Normal finger to nose left  Motor Observation:    No asymmetry, no atrophy, and no involuntary movements noted.  Strength:    Right hemiparesis, withdraws to stimuli on the left, withdraws slightly right LE, no withdrawal or movement RUE.      Sensation: intact to LT in the extremitie, denies sensory changes     Reflex Exam:  Toes:    Right upgoing Clonus:    Clonus is absent.           ASSESSMENT/PLAN [name] is a 73 y.o. male with history of diabetes mellitus and HTN but not compliant medications  presenting with right sided weakness and facial droop. tPA Given at [facility]   Stroke:  1 cm hypodensity involving the left paramedian pons - small vessel disease - MRI pending  Resultant  Right hemiparesis and dysarthria  CT head - Subtle approximate 1 cm hypodensity involving the left paramedian pons, increased in prominence from previous, and suspicious for evolving acute ischemic infarct.   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 No significant intraluminal thrombus or raised  dissection flap identified.  Carotid Doppler - CTA neck performed - carotid dopplers not indicated.  2D Echo - pending  Lacey Jensen Virus 2 - negative at OSH.  LDL - 166  HgbA1c - pending  UDS - not performed  VTE prophylaxis - SCDs  Diet - NPO  No antithrombotic prior to admission, now on No antithrombotic S/P tPA  Patient will be counseled to be compliant with his antithrombotic medications  Ongoing aggressive stroke risk factor management  Therapy recommendations:  pending  Disposition:  Pending  Hypertension  Stable . Permissive hypertension (OK if < 220/120) but gradually normalize in 5-7 days . Long-term BP goal normotensive  Hyperlipidemia  Lipid lowering medication PTA:  none  LDL 166, goal < 70  Current lipid lowering medication:None, patient has declined in the past  Continue statin at discharge  Diabetes  HgbA1c pending, goal < 7.0  Uncontrolled  Other Stroke Risk Factors  Advanced age  Obesity, Body mass index is 29.64 kg/m., recommend weight loss, diet and exercise as appropriate   Non compliant with medications   Other Active Problems  Na - 134  Non compliant with medications   Hospital day # 0  This is a 73 year old patient who refuses medications, he has been trying homeopathic treatments as well as lifestyle changes at home, wife said his glucose often runs 600.  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  I had a discussion with patient today, he still declined any insulin, he declines medical management.  Patient needs to be talked to again, I left a message for his wife to discuss.  We will let him know tomorrow to call her.  Personally examined patient and images, and have participated in and made any corrections needed to history, physical, neuro exam,assessment and plan as stated above.  I have personally obtained the history, evaluated lab date, reviewed imaging  studies and agree with radiology interpretations.    Sarina Ill, MD Stroke Neurology   A total of 35 minutes was spent for the care of this patient, spent on counseling patient and family on different diagnostic and therapeutic options, counseling and coordination of care, riskd ans benefits of management, compliance, or risk factor reduction and education.   To contact Stroke Continuity provider, please refer to [email or web address] After hours, contact General Neurology

## 2018-12-17 NOTE — Progress Notes (Signed)
  Speech Language Pathology Treatment: Cognitive-Linquistic(Dysarthria)  Patient Details Name: Robert Brewer MRN: [id number] DOB: [birth year] Today's Date: 12/17/2018 Time: 4917-9150 SLP Time Calculation (min) (ACUTE ONLY): 15 min  Assessment / Plan / Recommendation Clinical Impression  Pt was seen for dysarthria treatment and was cooperative throughout the session. He completed labial and lingual ROM and strengthening exercises with mod cues. He was educated regarding the nature of dysarthria and compensatory strategies for speech intelligibility; he verbalized understanding regarding areas of education. He was able to use compensatory strategies at the word level with 80% accuracy increasing to 100% accuracy with min. cues to overarticulate. At the phrase level he demonstrated 50% accuracy increasing to 100% accuracy with mod-max cues for vocal intensity and overarticulation. SLP will continue to follow.     HPI HPI: Pt is a 73 y.o. male with past medical history of diabetes mellitus, HTN not compliant with medications who presented to the emergency department with right-sided weakness and facial droop. CT of the head revealed subtle approximate 1 cm hypodensity involving the left paramedian pons, increased in prominence from previous, and suspicious for evolving acute ischemic infarct. IV tPA was administered and he presented with severe dysarthria when he was seen by neurology.       SLP Plan  Continue with current plan of care  Patient needs continued Speech Lanaguage Pathology Services    Recommendations                   Follow up Recommendations: Other (comment)(Continued SLP at level of care recommended by PT/OT) SLP Visit Diagnosis: Dysarthria and anarthria (R47.1) Plan: Continue with current plan of care       Ravenna Legore I. Hardin Negus, [place], [place] Office number [phone] Pager [phone]               Robert Brewer 12/17/2018,  3:39 PM

## 2018-12-17 NOTE — Progress Notes (Signed)
PT Cancellation Note  Patient Details Name: Robert Brewer MRN: [id number] DOB: [birth year]   Cancelled Treatment:    Reason Eval/Treat Not Completed: Active bedrest order Will follow.   Marguarite Arbour A Cordell Guercio 12/17/2018, 7:00 AM Wray Kearns, PT, DPT Acute Rehabilitation Services Pager [phone] Office [phone]

## 2018-12-17 NOTE — Progress Notes (Signed)
OT Cancellation Note  Patient Details Name: Robert Brewer MRN: [id number] DOB: 12/24/1945   Cancelled Treatment:    Reason Eval/Treat Not Completed: Active bedrest order.  Will check back,  Lucille Passy, OTR/L [place] Pager [phone] Office [phone]   Lucille Passy M 12/17/2018, 6:17 AM

## 2018-12-17 NOTE — Progress Notes (Signed)
Pt able to go to MRI w/out RN per Dr. Jaynee Eagles

## 2018-12-17 NOTE — Progress Notes (Signed)
SLP Cancellation Note  Patient Details Name: Robert Brewer MRN: [id number] DOB: 27-Jul-1945   Cancelled treatment:       Reason Eval/Treat Not Completed: Patient at procedure or test/unavailable(Pt having Echo at this time. SLP will follow up. )  Kerwin Augustus I. Hardin Negus, [place], [place] Office number [phone] Pager [phone]  Horton Marshall 12/17/2018, 2:15 PM

## 2018-12-17 NOTE — Progress Notes (Signed)
  Echocardiogram 2D Echocardiogram has been performed.  Robert Brewer 12/17/2018, 2:33 PM

## 2018-12-17 NOTE — Progress Notes (Addendum)
CBG this AM is 295.  Pt refused insulin and said he does not take medication.  Notified Dr. Jaynee Eagles

## 2018-12-17 NOTE — Progress Notes (Signed)
Pt's wife stated pt is non-compliant with his diabetes at home.  At one point was controlling his CBG by diet and exercise but gave up and refused to follow 2 different PCP's treatment plans.  Wife said average CBG when he checks it at home is around 600.

## 2018-12-18 LAB — GLUCOSE, CAPILLARY
Glucose-Capillary: 231 mg/dL — ABNORMAL HIGH (ref 70–99)
Glucose-Capillary: 314 mg/dL — ABNORMAL HIGH (ref 70–99)
Glucose-Capillary: 381 mg/dL — ABNORMAL HIGH (ref 70–99)
Glucose-Capillary: 239 mg/dL — ABNORMAL HIGH (ref 70–99)

## 2018-12-18 MED ORDER — INSULIN ASPART 100 UNIT/ML ~~LOC~~ SOLN
2.0000 [IU] | Freq: Once | SUBCUTANEOUS | Status: AC
  Administered 2018-12-18: 2 [IU] via SUBCUTANEOUS

## 2018-12-18 MED ORDER — INSULIN STARTER KIT- PEN NEEDLES (ENGLISH)
1.0000 | Freq: Once | Status: AC
  Administered 2018-12-18: 1
  Filled 2018-12-18: qty 1

## 2018-12-18 MED ORDER — INSULIN GLARGINE 100 UNIT/ML ~~LOC~~ SOLN
15.0000 [IU] | Freq: Every day | SUBCUTANEOUS | Status: DC
  Administered 2018-12-18 – 2018-12-21 (×4): 15 [IU] via SUBCUTANEOUS
  Filled 2018-12-18 (×4): qty 0.15

## 2018-12-18 NOTE — Progress Notes (Signed)
  Speech Language Pathology Treatment: Cognitive-Linquistic(Dysarthria)  Patient Details Name: Robert Brewer MRN: [id number] DOB: 08/10/1945 Today's Date: 12/18/2018 Time: 0300-9233 SLP Time Calculation (min) (ACUTE ONLY): 29 min  Assessment / Plan / Recommendation Clinical Impression  Pt was seen for dysarthria treatment and reported that he has been attempting to use some of the compensatory strategies for speech intelligibility. He was able to recall 2/3 strategies without cues but required min. cues for the third. He completed labial and lingual ROM and strengthening exercises with min-mod cues. He used compensatory strategies at the phrase level with 80% accuracy increasing to 100% accuracy with min. cues to overarticulate. However, moderate-max cues were needed at this level during less structured tasks such as him describing what was happening in photos. Pt participated in a short conversation with the SLP at the end of the session to facilitate generalization of strategies to the conversational level and pt required less support than at the onset of the session for use of compensatory strategies. SLP will continue to follow.    HPI HPI: Pt is a 73 y.o. male with past medical history of diabetes mellitus, HTN not compliant with medications who presented to the emergency department with right-sided weakness and facial droop. CT of the head revealed subtle approximate 1 cm hypodensity involving the left paramedian pons, increased in prominence from previous, and suspicious for evolving acute ischemic infarct. IV tPA was administered and he presented with severe dysarthria when he was seen by neurology.       SLP Plan  Continue with current plan of care       Recommendations                   Follow up Recommendations: Other (comment)(Continued SLP at level of care recommended by PT/OT) SLP Visit Diagnosis: Dysarthria and anarthria (R47.1) Plan: Continue with current plan of  care       Aleysia Oltmann I. Hardin Negus, [place], [place] Office number [phone] Pager [phone]               Horton Marshall 12/18/2018, 9:50 AM

## 2018-12-18 NOTE — Progress Notes (Signed)
Rehab Admissions Coordinator Note:  Patient was screened by Michel Santee, PT, DPT for appropriateness for an Inpatient Acute Rehab Consult.  At this time, we are recommending Inpatient Rehab consult. I will contact MD for order.   Shann Medal, PT, DPT Admissions Coordinator [phone] 12/18/18  2:27 PM

## 2018-12-18 NOTE — Progress Notes (Addendum)
Inpatient Diabetes Program Recommendations  AACE/ADA: New Consensus Statement on Inpatient Glycemic Control (2015)  Target Ranges:  Prepandial:   less than 140 mg/dL      Peak postprandial:   less than 180 mg/dL (1-2 hours)      Critically ill patients:  140 - 180 mg/dL   Lab Results  Component Value Date   GLUCAP 314 (H) 12/18/2018    Review of Glycemic Control Results for Robert Brewer, Robert Brewer (MRN [id number]) as of 12/18/2018 13:17  Ref. Range 12/17/2018 11:46 12/17/2018 15:44 12/17/2018 21:48 12/18/2018 06:00 12/18/2018 12:54  Glucose-Capillary Latest Ref Range: 70 - 99 mg/dL 305 (H) 311 (H) 448 (H) 231 (H) 314 (H)   Diabetes history: DM2 Outpatient Diabetes medications: No home meds listed Current orders for Inpatient glycemic control: Novolog sensitive correction tid  Inpatient Diabetes Program Recommendations:   Noted A1c pending. -Lantus 15 15 (0.15 units/kg x 96.4 kg = 15) -Novolog 4 units tid meal coverage if eats 50%  13:45 Spoke with patient @ bedside and reviewed need for insulin @ this time and patient states willingness to receive insulin injections and follow through with taking insulin @ home if he needs to. States his wife takes 3 insulin injections daily by pen @ home. Updated Dr. Leonie Man that patient is willing to receive insulin injections.  Thank you, Nani Gasser. Sabas Frett, RN, MSN, CDE  Diabetes Coordinator Inpatient Glycemic Control Team Team Pager [phone] (8am-5pm) 12/18/2018 1:19 PM

## 2018-12-18 NOTE — Evaluation (Signed)
Physical Therapy Evaluation Patient Details Name: Robert Brewer MRN: [id number] DOB: 12-Mar-1946 Today's Date: 12/18/2018   History of Present Illness  Pt is a 73 y.o. male with past medical history of diabetes mellitus, HTN not compliant with medications who presented to the emergency department with right-sided weakness and facial droop. CT of the head revealed subtle approximate 1 cm hypodensity involving the left paramedian pons, increased in prominence from previous, and suspicious for evolving acute ischemic infarct. IV tPA was administered and he presented with severe dysarthria when he was seen by neurology.    Clinical Impression  Patient was seen for intial PT evaluation. He presents with significant R UE deficits and strength deficits in the right lower extremity. He required MAX A to take steps to the chair. He has vertigo but can control it with verbal cuing to focus on an object. He required min a to the edge of the bed and min a/ min guard to remain sitting. In standing he shifts to the right, He can correct with verbal and tactile cuing. He would benefit from aggressive rehabilitation at [facility]. Acute therapy will continue to work with the patient.     Follow Up Recommendations CIR    Equipment Recommendations  Rolling walker with 5" wheels;3in1 (PT)    Recommendations for Other Services Rehab consult     Precautions / Restrictions Precautions Precautions: Fall Restrictions Weight Bearing Restrictions: No      Mobility  Bed Mobility Overal bed mobility: Needs Assistance Bed Mobility: Supine to Sit     Supine to sit: Min assist;+2 for physical assistance     General bed mobility comments: Min a+2 to sit up at the edge of the  bed. Min a at first for balance.   Transfers Overall transfer level: Needs assistance Equipment used: Rolling walker (2 wheeled) Transfers: Sit to/from Stand Sit to Stand: Max assist;+2 physical assistance         General transfer  comment: initially mod a x2 but quickly fatigues and then requires max a x2. using RW. Pt may do better this time with squat pivot or stand pivot. Benefits from use of gaze fixation on target to reduce vertigo. Patientl leaned to the right but was able to correct with verbal and tactile cuing.   Ambulation/Gait Ambulation/Gait assistance: Max assist;+2 physical assistance;+2 safety/equipment Gait Distance (Feet): 2 Feet Assistive device: Rolling walker (2 wheeled)   Gait velocity: decreased  Gait velocity interpretation: <1.31 ft/sec, indicative of household ambulator General Gait Details: Patient required physical assist to move right LE from 1 therapist, while 1 therapist helped him maintain balance with mod-> max a. He was able to take 3 steps to the chair. Nursing tech advised to perfrom 2 person stand or squat pivot transfer to get back to bed.   Stairs            Wheelchair Mobility    Modified Rankin (Stroke Patients Only) Modified Rankin (Stroke Patients Only) Pre-Morbid Rankin Score: No symptoms Modified Rankin: Severe disability     Balance Overall balance assessment: Needs assistance Sitting-balance support: Single extremity supported;Feet supported Sitting balance-Leahy Scale: Poor Sitting balance - Comments: close supervision to occassional min a Postural control: Right lateral lean;Posterior lean Standing balance support: Bilateral upper extremity supported Standing balance-Leahy Scale: Poor Standing balance comment: R bias - requires mod a x2 using RW with total assist for RUE on RW. Pt felt safer with RW  Pertinent Vitals/Pain Pain Assessment: 0-10 Pain Score: 3  Pain Location: right shoulder with passive movevment  Pain Descriptors / Indicators: Sore Pain Intervention(s): Limited activity within patient's tolerance;Monitored during session    Home Living Family/patient expects to be discharged to:: Private  residence Living Arrangements: Spouse/significant other Available Help at Discharge: Family;Available 24 hours/day Type of Home: House Home Access: Stairs to enter   [employer] of Steps: 1 step to back porch no railing Home Layout: Two level Home Equipment: Cane - single point      Prior Function Level of Independence: Independent         Comments: Patient reports he has a cane at home that his wife used sometimes but he dosent. He sleeps in a recliner but only because he stays up late watching TV.      Hand Dominance   Dominant Hand: Right    Extremity/Trunk Assessment   Upper Extremity Assessment Upper Extremity Assessment: Defer to OT evaluation RUE Deficits / Details: Pt with no active movement in RUE at this time. PROM for shoulder flexion to 90* due to pain (3/10). Will monitor RUE Sensation: WNL RUE Coordination: (no active movement RUE at this time)    Lower Extremity Assessment Lower Extremity Assessment: RLE deficits/detail RLE Deficits / Details: Does have active firing of the right leg but weak compared to the left     Cervical / Trunk Assessment Cervical / Trunk Assessment: Normal  Communication   Communication: Expressive difficulties  Cognition Arousal/Alertness: Awake/alert Behavior During Therapy: WFL for tasks assessed/performed Overall Cognitive Status: Within Functional Limits for tasks assessed                                 General Comments: Pt labile during session however no overt cognitive deficits noted      General Comments      Exercises     Assessment/Plan    PT Assessment Patient needs continued PT services  PT Problem List Decreased strength;Decreased range of motion;Decreased activity tolerance;Decreased mobility;Decreased coordination;Decreased knowledge of use of DME;Decreased safety awareness;Decreased balance       PT Treatment Interventions DME instruction;Gait training;Stair  training;Functional mobility training;Therapeutic activities;Therapeutic exercise;Neuromuscular re-education;Manual techniques    PT Goals (Current goals can be found in the Care Plan section)  Acute Rehab PT Goals Patient Stated Goal: "I want to move better and my arm isn't really working" PT Goal Formulation: With patient Time For Goal Achievement: 12/25/18 Potential to Achieve Goals: Good    Frequency Min 4X/week   Barriers to discharge        Co-evaluation PT/OT/SLP Co-Evaluation/Treatment: Yes Reason for Co-Treatment: Complexity of the patient's impairments (multi-system involvement);Necessary to address cognition/behavior during functional activity;For patient/therapist safety;To address functional/ADL transfers PT goals addressed during session: Mobility/safety with mobility;Balance;Proper use of DME;Strengthening/ROM         AM-PAC PT "6 Clicks" Mobility  Outcome Measure Help needed turning from your back to your side while in a flat bed without using bedrails?: A Lot Help needed moving from lying on your back to sitting on the side of a flat bed without using bedrails?: A Lot Help needed moving to and from a bed to a chair (including a wheelchair)?: A Lot Help needed standing up from a chair using your arms (e.g., wheelchair or bedside chair)?: A Lot Help needed to walk in hospital room?: Total Help needed climbing 3-5 steps with a railing? : Total 6 Click  Score: 10    End of Session Equipment Utilized During Treatment: Gait belt Activity Tolerance: Patient tolerated treatment well Patient left: in chair;with call bell/phone within reach;with chair alarm set Nurse Communication: Mobility status PT Visit Diagnosis: Unsteadiness on feet (R26.81);Other abnormalities of gait and mobility (R26.89);Muscle weakness (generalized) (M62.81)    Time: 1010-1045 PT Time Calculation (min) (ACUTE ONLY): 35 min   Charges:   PT Evaluation $PT Eval Moderate Complexity: 1 Mod             Carney Living PT DPT  12/18/2018, 1:06 PM

## 2018-12-18 NOTE — Evaluation (Signed)
Occupational Therapy Evaluation Patient Details Name: Robert Brewer MRN: [id number] DOB: 01-25-1946 Today's Date: 12/18/2018    History of Present Illness Pt is a 73 y.o. male with past medical history of diabetes mellitus, HTN not compliant with medications who presented to the emergency department with right-sided weakness and facial droop. CT of the head revealed subtle approximate 1 cm hypodensity involving the left paramedian pons, increased in prominence from previous, and suspicious for evolving acute ischemic infarct. IV tPA was administered and he presented with severe dysarthria when he was seen by neurology.     Clinical Impression   Pt seen for OT eval today. Pt as independent prior to admission and lives with his wife who can provide min a upon discharge. Pt very motivated to participate and improvement.  Pt demonstrates R hemiplegia, decreased balance, visual vestibular deficits.  Pt with R bias in standing -is aware and can shift weight with cues and assistance. 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 Pt in agreement.     Follow Up Recommendations  CIR    Equipment Recommendations  Other (comment)(defer to next venue)    Recommendations for Other Services Rehab consult     Precautions / Restrictions Precautions Precautions: Fall Restrictions Weight Bearing Restrictions: No      Mobility Bed Mobility Overal bed mobility: Needs Assistance Bed Mobility: Supine to Sit     Supine to sit: Min assist;+2 for physical assistance     General bed mobility comments: pt reports he was sleeping in recliner at home because wife watched tv late however had no difficulty rising from bed  Transfers Overall transfer level: Needs assistance Equipment used: Rolling walker (2 wheeled) Transfers: Sit to/from Stand Sit to Stand: +2 physical assistance;Max assist         General  transfer comment: initially mod a x2 but quickly fatigues and then requires max a x2. using RW. Pt may do better this time with squat pivot or stand pivot. Benefits from use of gaze fixation on target to reduce vertigo    Balance Overall balance assessment: Needs assistance Sitting-balance support: Single extremity supported;Feet supported Sitting balance-Leahy Scale: Poor Sitting balance - Comments: close supervision to occassional min a Postural control: Right lateral lean;Posterior lean Standing balance support: Bilateral upper extremity supported Standing balance-Leahy Scale: Poor Standing balance comment: R bias - requires mod a x2 using RW with total assist for RUE on RW. Pt felt safer with RW                           ADL either performed or assessed with clinical judgement   ADL Overall ADL's : Needs assistance/impaired Eating/Feeding: Set up Eating/Feeding Details (indicate cue type and reason): sitting supported in chair. If sitting EOB would need close supervision to occassional min a for balance.  Grooming: Wash/dry hands;Wash/dry face;Oral care;Minimal assistance;Set up;Sitting Grooming Details (indicate cue type and reason): in supported chair Upper Body Bathing: Moderate assistance;Sitting   Lower Body Bathing: Maximal assistance;Sit to/from stand   Upper Body Dressing : Moderate assistance;Sitting   Lower Body Dressing: Maximal assistance;Sit to/from stand   Toilet Transfer: +2 for physical assistance;Maximal assistance;BSC Toilet Transfer Details (indicate cue type and reason): initially pt mod a x2 however quickly fatigues and then requires max a x 2 with RW (pt may do best at this time with squat or stand pivot) Toileting- Clothing Manipulation  and Hygiene: Maximal assistance       Functional mobility during ADLs: +2 for physical assistance;Moderate assistance General ADL Comments: R bias with decreased RLE control. Pt able to weight shift to midline  and then to L with cues but unable to maintain.  Pt aware he was losing balance to the left however no attempts to remediate.      Vision Patient Visual Report: Diplopia(pr reports diplopia however was not able to elicit upon eval and pt stated "its getting better") Vision Assessment?: Yes Eye Alignment: Impaired (comment) Ocular Range of Motion: Within Functional Limits Alignment/Gaze Preference: Other (comment)(eyes with mild malalignment) Tracking/Visual Pursuits: Able to track stimulus in all quads without difficulty Visual Fields: No apparent deficits(will continue to monitor via functional activity) Diplopia Assessment: Other (comment)(pt reports diplopia however unable to elicit upon eval and pt states it is improving) Additional Comments: Pt with gaze stabilization deficits - horizontal head movements greater than vertical. Pt reports low grade vertigo upon sitting up (3/10) which improves with gaze fixation on near target.     Perception     Praxis      Pertinent Vitals/Pain Pain Assessment: 0-10(with passive shoulder flexion - pt states he fell on it when he fell.  Pt also states he had premorbid issues with his rotator cuff with his RUE. ) Pain Score: 3  Pain Descriptors / Indicators: Sore Pain Intervention(s): Monitored during session;Repositioned     Hand Dominance Right   Extremity/Trunk Assessment Upper Extremity Assessment Upper Extremity Assessment: RUE deficits/detail RUE Deficits / Details: Pt with no active movement in RUE at this time. PROM for shoulder flexion to 90* due to pain (3/10). Will monitor RUE Sensation: WNL RUE Coordination: (no active movement RUE at this time)   Lower Extremity Assessment Lower Extremity Assessment: Defer to PT evaluation       Communication Communication Communication: Expressive difficulties(dysarthria)   Cognition Arousal/Alertness: Awake/alert Behavior During Therapy: WFL for tasks assessed/performed Overall Cognitive  Status: Within Functional Limits for tasks assessed                                 General Comments: Pt labile during session however no overt cognitive deficits noted   General Comments       Exercises     Shoulder Instructions      Home Living Family/patient expects to be discharged to:: Private residence Living Arrangements: Spouse/significant other Available Help at Discharge: Family;Available 24 hours/day Type of Home: House Home Access: Stairs to enter [employer] of Steps: 1 step to back porch no railing   Home Layout: Two level(basement as well where cats are kept)     Bathroom Shower/Tub: Occupational psychologist: Standard Bathroom Accessibility: Yes(bed and bath are on the first floor)   Home Equipment: Kasandra Knudsen - single point      Lives With: Spouse    Prior Functioning/Environment Level of Independence: Independent                 OT Problem List: Decreased strength;Decreased activity tolerance;Impaired vision/perception;Impaired tone;Impaired UE functional use;Impaired balance (sitting and/or standing);Pain      OT Treatment/Interventions: Self-care/ADL training;Therapeutic exercise;Neuromuscular education;DME and/or AE instruction;Therapeutic activities;Balance training;Visual/perceptual remediation/compensation;Patient/family education    OT Goals(Current goals can be found in the care plan section) Acute Rehab OT Goals Patient Stated Goal: "I want to move better and my arm isn't really working" OT Goal Formulation: With patient Time For  Goal Achievement: 01/01/19 Potential to Achieve Goals: Good  OT Frequency: Min 3X/week   Barriers to D/C:    pt reports his wife can do min a upon d/c         Co-evaluation PT/OT/SLP Co-Evaluation/Treatment: Yes Reason for Co-Treatment: For patient/therapist safety;To address functional/ADL transfers          AM-PAC OT "6 Clicks" Daily Activity     Outcome Measure Help  from another person eating meals?: A Little Help from another person taking care of personal grooming?: A Little Help from another person toileting, which includes using toliet, bedpan, or urinal?: A Lot Help from another person bathing (including washing, rinsing, drying)?: A Lot Help from another person to put on and taking off regular upper body clothing?: A Lot Help from another person to put on and taking off regular lower body clothing?: A Lot 6 Click Score: 14   End of Session Equipment Utilized During Treatment: Gait belt;Rolling walker Nurse Communication: Mobility status  Activity Tolerance:   Patient left: in chair;with call bell/phone within reach;with chair alarm set  OT Visit Diagnosis: Unsteadiness on feet (R26.81);Muscle weakness (generalized) (M62.81);Other symptoms and signs involving the nervous system (R29.898);Hemiplegia and hemiparesis;Pain Hemiplegia - Right/Left: Right Hemiplegia - dominant/non-dominant: Dominant Hemiplegia - caused by: Cerebral infarction Pain - Right/Left: Right Pain - part of body: Shoulder                Time: 1010-1045 OT Time Calculation (min): 35 min Charges:  OT General Charges $OT Visit: 1 Visit OT Evaluation $OT Eval Moderate Complexity: 1 Mod    Quay Burow, OTR/L 12/18/2018, 11:43 AM

## 2018-12-18 NOTE — Progress Notes (Signed)
STROKE TEAM PROGRESS NOTE      SUBJECTIVE (INTERVAL HISTORY) His wife is on the phone at bedside. He refuses insulin.  Continues to have significant dysarthria but can be understood with some difficulty.  OBJECTIVE Vitals:   12/18/18 0347 12/18/18 0516 12/18/18 0730 12/18/18 1100  BP: 135/82 138/82 119/70 (!) 143/78  Pulse: (!) 55 (!) 58 (!) 56 (!) 53  Resp: 15 17 18 18   Temp: 97.7 F (36.5 C) 98.1 F (36.7 C) 98.4 F (36.9 C) 98.2 F (36.8 C)  TempSrc: Oral Oral Axillary Oral  SpO2: 96% 97% 95% 95%  Weight:      Height:        CBC:  Recent Labs  Lab 12/17/18 0448  HGB 13.6  HCT 16.1    Basic Metabolic Panel:  Recent Labs  Lab 12/17/18 0448 12/17/18 0454  NA 134* 134*  K 4.3 3.9  CL 99 97*  CO2  --  24  GLUCOSE 394* 383*  BUN 21 19  CREATININE 0.70 0.79  CALCIUM  --  8.9    Lipid Panel:     Component Value Date/Time   CHOL 227 (H) 12/17/2018 0212   TRIG 81 12/17/2018 0212   HDL 45 12/17/2018 0212   CHOLHDL 5.0 12/17/2018 0212   VLDL 16 12/17/2018 0212   LDLCALC 166 (H) 12/17/2018 0212   HgbA1c: No results found for: HGBA1C Urine Drug Screen: No results found for: LABOPIA, COCAINSCRNUR, LABBENZ, AMPHETMU, THCU, LABBARB  Alcohol Level No results found for: ETH  IMAGING  MRI Wo Contrast -acute infarct involving left paramedian pons, left occipital lobe    Ct Angio Head W Or Wo Contrast 12/17/2018 IMPRESSION:   CT HEAD 1. Subtle approximate 1 cm hypodensity involving the left paramedian pons, increased in prominence from previous, and suspicious for evolving acute ischemic infarct.  2. Otherwise stable appearance of the head with associated age-related cerebral atrophy. No other acute abnormality identified.   CTA HEAD AND NECK Ct Angio Neck W Or Wo Contrast 1. 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 No  significant intraluminal thrombus or raised dissection flap identified.  2. No large vessel occlusion. No other hemodynamically significant or correctable stenosis.   CXR - normal at OSH   Transthoracic Echocardiogram  Normal ejection fraction of 60 to 65%.  Mild thickening of aortic valve with calcification  EKG - SB at OSH   PHYSICAL EXAM Blood pressure (!) 143/78, pulse (!) 53, temperature 98.2 F (36.8 C), temperature source Oral, resp. rate 18, height 5\' 11"  (1.803 m), weight 96.4 kg, SpO2 95 %.   Physical exam: Pleasant elderly Caucasian male not in distress. . Afebrile. Head is nontraumatic. Neck is supple without bruit.    Cardiac exam no murmur or gallop. Lungs are clear to auscultation. Distal pulses are well felt. Neurological Exam: Awake alert oriented to time place and person.  Severe dysarthria with some pseudobulbar affect.  But can be understood with some difficulty.  Able to name repeat and follow commands well.  Extraocular movements are full range but with saccadic dysmetria on left gaze.  Complains of some subjective diplopia on lateral gaze but no objective impairment of eye movement.  Mild right lower facial weakness.  Tongue midline.  Weak cough and gag.  Right hemiplegia with right upper extremity 0/5 strength with hypotonia.  Right lower extremity 2/5 strength.  Normal strength in the left side.  ASSESSMENT/PLAN [name] is a 73 y.o. male with history of diabetes mellitus and HTN but not compliant medications  presenting with right sided weakness and facial droop. 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 No significant intraluminal thrombus or raised dissection flap identified.  Carotid Doppler - CTA neck performed - carotid dopplers not indicated.  2D Echo -normal ejection fraction 60 to 65%.  Left atrium size is mildly dilated.  [employer] Virus 2 - negative at OSH.  LDL - 166  HgbA1c - pending  UDS - not performed  VTE prophylaxis - SCDs  Diet - NPO  No antithrombotic prior to admission, now on No antithrombotic S/P tPA  Patient will be counseled to be compliant with his antithrombotic medications  Ongoing aggressive stroke risk factor management  Therapy recommendations: CLR   disposition:  Pending  Hypertension  Stable . Permissive hypertension (OK if < 220/120) but gradually normalize in 5-7 days . Long-term BP goal normotensive  Hyperlipidemia  Lipid lowering medication PTA:  none  LDL 166, goal < 70  Current lipid lowering medication:None, patient has declined in the past  Continue statin at discharge  Diabetes  HgbA1c pending, goal < 7.0  Uncontrolled  Other Stroke Risk Factors  Advanced age  Obesity, Body mass index is 29.64 kg/m., recommend weight loss, diet and exercise as appropriate   Non compliant with medications   Other Active Problems  Na - 134  Non compliant with medications   Hospital day # 1  This is a 73 year old patient who refuses medications, he has been trying homeopathic treatments as well as lifestyle changes at home, wife said his glucose often runs 600.  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  I had a discussion with patient today, he is willing to  consider  insulin, as his wife takes it to.  We will get diabetes coordinator to counsel him in talk to him.  I spoke to the patient's wife over the phone and answered questions.  Hopefully transfer to inpatient rehab over the next few days  Personally examined patient and images, and have participated in and made any corrections needed to history, physical, neuro exam,assessment and plan as stated above.  I have personally obtained the history, evaluated lab date, reviewed imaging studies and agree with radiology interpretations.        A total of 25 minutes was spent for the care of this patient, spent on counseling patient and family on different diagnostic and therapeutic options, counseling and coordination of care, riskd ans benefits of management, compliance, or risk factor reduction and education.  Antony Contras, MD  To contact Stroke Continuity provider, please refer to [email or web address] After hours, contact General Neurology

## 2018-12-18 NOTE — Progress Notes (Signed)
Insulin starter kit has been given to pt. Nurse will teach pt how to use the kit when the patient is more alert. Pt is sleeping. Robert Brewer

## 2018-12-19 LAB — GLUCOSE, CAPILLARY
Glucose-Capillary: 250 mg/dL — ABNORMAL HIGH (ref 70–99)
Glucose-Capillary: 321 mg/dL — ABNORMAL HIGH (ref 70–99)
Glucose-Capillary: 324 mg/dL — ABNORMAL HIGH (ref 70–99)

## 2018-12-19 NOTE — TOC Initial Note (Signed)
Transition of Care [facility]) - Initial/Assessment Note    Patient Details  Name: Robert Brewer MRN: [id number] Date of Birth: 1946-02-13  Transition of Care [facility]) CM/SW Contact:    Pollie Friar, RN Phone Number: 12/19/2018, 1:21 PM  Clinical Narrative:                 Pt without a PcP. Asking for PCP in [place] area. CM was able to get him an appt at [facility] for [facility]th. Information needed faxed to the office with pts permission. CM following for d/c disposition.  Expected Discharge Plan: IP Rehab Facility Barriers to Discharge: Continued Medical Work up   Patient Goals and CMS Choice Patient states their goals for this hospitalization and ongoing recovery are:: wants to get use of rt arm back      Expected Discharge Plan and Services Expected Discharge Plan: [place]   Discharge Planning Services: CM Consult   Living arrangements for the past 2 months: Single Family Home(3 stories  with the basement)                                      Prior Living Arrangements/Services Living arrangements for the past 2 months: Single Family Home(3 stories  with the basement) Lives with:: Spouse Patient language and need for interpreter reviewed:: Yes(no needs) Do you feel safe going back to the place where you live?: Yes          Current home services: DME(cane) Criminal Activity/Legal Involvement Pertinent to Current Situation/Hospitalization: No - Comment as needed  Activities of Daily Living      Permission Sought/Granted                  Emotional Assessment Appearance:: Appears stated age Attitude/Demeanor/Rapport: Crying Affect (typically observed): Accepting, Tearful/Crying(pt concerned about wife at home and regaining use of rt arm) Orientation: : Oriented to Self, Oriented to Place, Oriented to  Time, Oriented to Situation   Psych Involvement: No (comment)  Admission diagnosis:  Code Stroke  Patient Active Problem  List   Diagnosis Date Noted  . Ischemic cerebrovascular accident (CVA) ([place]) 12/17/2018   PCP:  Patient, No Pcp Per Pharmacy:  No Pharmacies Listed    Social Determinants of Health (SDOH) Interventions    Readmission Risk Interventions No flowsheet data found.

## 2018-12-19 NOTE — Progress Notes (Signed)
Physical Therapy Treatment Patient Details Name: Robert Brewer MRN: [id number] DOB: 06-01-1946 Today's Date: 12/19/2018    History of Present Illness Pt is a 73 y.o. male with past medical history of diabetes mellitus, HTN not compliant with medications who presented to the emergency department with right-sided weakness and facial droop. CT of the head revealed subtle approximate 1 cm hypodensity involving the left paramedian pons, increased in prominence from previous, and suspicious for evolving acute ischemic infarct. IV tPA was administered and he presented with severe dysarthria when he was seen by neurology.      PT Comments    Patient is very motivated to improve. He had difficulty moving his right leg forward in standing. When cued to shift his weight to the right he was able to advance his right leg forward. He required a max a stand pivot transfer to the chair. Once to the chair. He stood 3x while working on walking. The patient would be a good candidate for CIR. He required tactile cuing and assist for there-ex with the right leg. The patient was working on there-ex even after the treatment was done.   Follow Up Recommendations  CIR     Equipment Recommendations  Rolling walker with 5" wheels;3in1 (PT)    Recommendations for Other Services Rehab consult     Precautions / Restrictions Precautions Precautions: Fall Restrictions Weight Bearing Restrictions: No    Mobility  Bed Mobility Overal bed mobility: Needs Assistance Bed Mobility: Supine to Sit     Supine to sit: Mod assist;+2 for physical assistance     General bed mobility comments: Mod a to scoot to the edge of the bed. Mod a to sit up. Had to place patients arm on othe right.   Transfers Overall transfer level: Needs assistance Equipment used: Hemi-walker Transfers: Stand Pivot Transfers Sit to Stand: Mod assist;Max assist;+2 physical assistance Stand pivot transfers: Max assist;+2 physical assistance        General transfer comment: Sit to stand 3x with therapy. Patient unable to take a step to the chair. Mod a form bed. Max a +2 to stand from the chair. Patient unabel to transfer with hemi-walker to the chair without stand pivot transfer.   Ambulation/Gait Ambulation/Gait assistance: Mod assist Gait Distance (Feet): 3 Feet Assistive device: Rolling walker (2 wheeled) Gait Pattern/deviations: Decreased weight shift to left;Ataxic;Narrow base of support;Decreased stride length Gait velocity: decreased    General Gait Details: Patient had difficulty moving his right leg. He shifts weight to the right. When he corrected his center of balance he was abelt o move his right leg. Max cuing for shifting. Difficulty lifting his right LE.    Stairs             Wheelchair Mobility    Modified Rankin (Stroke Patients Only) Modified Rankin (Stroke Patients Only) Pre-Morbid Rankin Score: No symptoms Modified Rankin: Severe disability     Balance Overall balance assessment: Needs assistance Sitting-balance support: Single extremity supported;Feet supported Sitting balance-Leahy Scale: Poor Sitting balance - Comments: close supervision to occassional min a Postural control: Right lateral lean;Posterior lean Standing balance support: Bilateral upper extremity supported Standing balance-Leahy Scale: Poor Standing balance comment: R bias - requires mod a x2 using RW with total assist for RUE on RW. Pt felt safer with RW                            Cognition Arousal/Alertness: Awake/alert Behavior During Therapy: [facility]  for tasks assessed/performed Overall Cognitive Status: Within Functional Limits for tasks assessed                                 General Comments: Patient les s emotioanl today      Exercises General Exercises - Lower Extremity Ankle Circles/Pumps: 20 reps Quad Sets: 20 reps;Both;Other (comment) Long Arc Quad: 20 reps;Both Hip  Flexion/Marching: 20 reps;Both Other Exercises Other Exercises: Patient had some difficulty with ther-ex on the right side but he was cued to keep trying to work on exercises. The  more he practced the better he did.     General Comments        Pertinent Vitals/Pain Pain Assessment: No/denies pain Pain Location: No pain noted today with movement     Home Living                      Prior Function            PT Goals (current goals can now be found in the care plan section) Acute Rehab PT Goals Patient Stated Goal: "I want to move better and my arm isn't really working" PT Goal Formulation: With patient Time For Goal Achievement: 12/25/18 Potential to Achieve Goals: Good Progress towards PT goals: Progressing toward goals    Frequency    Min 4X/week      PT Plan Current plan remains appropriate    Co-evaluation              AM-PAC PT "6 Clicks" Mobility   Outcome Measure  Help needed turning from your back to your side while in a flat bed without using bedrails?: A Lot Help needed moving from lying on your back to sitting on the side of a flat bed without using bedrails?: A Lot Help needed moving to and from a bed to a chair (including a wheelchair)?: A Lot Help needed standing up from a chair using your arms (e.g., wheelchair or bedside chair)?: A Lot Help needed to walk in hospital room?: Total Help needed climbing 3-5 steps with a railing? : Total 6 Click Score: 10    End of Session Equipment Utilized During Treatment: Gait belt Activity Tolerance: Patient tolerated treatment well Patient left: in chair;with call bell/phone within reach;with chair alarm set Nurse Communication: Mobility status PT Visit Diagnosis: Unsteadiness on feet (R26.81);Other abnormalities of gait and mobility (R26.89);Muscle weakness (generalized) (M62.81)     Time: 3845-3646 PT Time Calculation (min) (ACUTE ONLY): 27 min  Charges:                            Carney Living PT DPT  12/19/2018, 1:23 PM

## 2018-12-19 NOTE — Progress Notes (Signed)
STROKE TEAM PROGRESS NOTE      SUBJECTIVE (INTERVAL HISTORY) His  Physical therapists are   bedside. He has agreed yo take insulin.  Has slight improvement in his dysarthria but remains plegic on right.  OBJECTIVE Vitals:   12/18/18 2342 12/19/18 0352 12/19/18 0724 12/19/18 1227  BP: 125/74 126/74 124/70 138/83  Pulse: (!) 55 (!) 51 (!) 49 69  Resp: 16 17 14 16   Temp: 98 F (36.7 C) 97.9 F (36.6 C) 98.2 F (36.8 C) 98.4 F (36.9 C)  TempSrc:   Oral Oral  SpO2: 95% 95% 95% 98%  Weight:      Height:        CBC:  Recent Labs  Lab 12/17/18 0448  HGB 13.6  HCT 26.3    Basic Metabolic Panel:  Recent Labs  Lab 12/17/18 0448 12/17/18 0454  NA 134* 134*  K 4.3 3.9  CL 99 97*  CO2  --  24  GLUCOSE 394* 383*  BUN 21 19  CREATININE 0.70 0.79  CALCIUM  --  8.9    Lipid Panel:     Component Value Date/Time   CHOL 227 (H) 12/17/2018 0212   TRIG 81 12/17/2018 0212   HDL 45 12/17/2018 0212   CHOLHDL 5.0 12/17/2018 0212   VLDL 16 12/17/2018 0212   LDLCALC 166 (H) 12/17/2018 0212   HgbA1c: No results found for: HGBA1C Urine Drug Screen: No results found for: LABOPIA, COCAINSCRNUR, LABBENZ, AMPHETMU, THCU, LABBARB  Alcohol Level No results found for: ETH  IMAGING  MRI Wo Contrast -acute infarct involving left paramedian pons, left occipital lobe    Ct Angio Head W Or Wo Contrast 12/17/2018 IMPRESSION:   CT HEAD 1. Subtle approximate 1 cm hypodensity involving the left paramedian pons, increased in prominence from previous, and suspicious for evolving acute ischemic infarct.  2. Otherwise stable appearance of the head with associated age-related cerebral atrophy. No other acute abnormality identified.   CTA HEAD AND NECK Ct Angio Neck W Or Wo Contrast 1. 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 No significant intraluminal  thrombus or raised dissection flap identified.  2. No large vessel occlusion. No other hemodynamically significant or correctable stenosis.   CXR - normal at OSH   Transthoracic Echocardiogram  Normal ejection fraction of 60 to 65%.  Mild thickening of aortic valve with calcification  EKG - SB at OSH   PHYSICAL EXAM Blood pressure 138/83, pulse 69, temperature 98.4 F (36.9 C), temperature source Oral, resp. rate 16, height 5\' 11"  (1.803 m), weight 96.4 kg, SpO2 98 %.   Physical exam: Pleasant elderly Caucasian male not in distress. . Afebrile. Head is nontraumatic. Neck is supple without bruit.    Cardiac exam no murmur or gallop. Lungs are clear to auscultation. Distal pulses are well felt. Neurological Exam: Awake alert oriented to time place and person.  Severe dysarthria but can be understood with some difficulty.  Extraocular movements are full range but with saccadic dysmetria on left gaze. Mild right lower facial weakness.  Tongue midline.  Weak cough and gag.  Right hemiplegia with right upper extremity 0/5 strength with hypotonia.  Right lower extremity 2/5 strength.  Normal strength in the left side.        ASSESSMENT/PLAN Mr. RUEL DIMMICK is a 73 y.o. male with history of diabetes mellitus and HTN but not compliant medications  presenting with right sided  weakness and facial droop. 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      MRI head -left paramedian pontine as well as left occipital infarct MRA head - not performed  CTA H&N - Interval improvement/clearing of previously seen small filling defect in the  proximal-mid basilar artery. 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 No significant intraluminal thrombus or raised dissection flap identified.  Carotid Doppler - CTA neck performed - carotid dopplers not indicated.  2D Echo -normal ejection fraction 60 to 65%.  Left atrium size is mildly dilated.  [employer] Virus 2 - negative at OSH.  LDL - 166  HgbA1c - pending  UDS - not performed  VTE prophylaxis - SCDs  Diet - NPO  No antithrombotic prior to admission, now on No antithrombotic S/P tPA  Patient will be counseled to be compliant with his antithrombotic medications  Ongoing aggressive stroke risk factor management  Therapy recommendations: CLR   disposition:  Pending  Hypertension  Stable . Permissive hypertension (OK if < 220/120) but gradually normalize in 5-7 days . Long-term BP goal normotensive  Hyperlipidemia  Lipid lowering medication PTA:  none  LDL 166, goal < 70  Current lipid lowering medication:None, patient has declined in the past  Continue statin at discharge  Diabetes  HgbA1c pending, goal < 7.0  Uncontrolled  Other Stroke Risk Factors  Advanced age  Obesity, Body mass index is 29.64 kg/m., recommend weight loss, diet and exercise as appropriate   Non compliant with medications   Other Active Problems  Na - 134  Non compliant with medications   Hospital day # 2  Continue ongoing physical occupational therapy.  Patient has agreed to starting insulin and hopefully will be compliant in the future with all his medications as well.  Hopefully transfer to inpatient rehab over the next few days    Antony Contras, MD  To contact Stroke Continuity provider, please refer to [email or web address] After hours, contact General Neurology

## 2018-12-19 NOTE — Progress Notes (Addendum)
Inpatient Diabetes Program Recommendations  AACE/ADA: New Consensus Statement on Inpatient Glycemic Control   Target Ranges:  Prepandial:   less than 140 mg/dL      Peak postprandial:   less than 180 mg/dL (1-2 hours)      Critically ill patients:  140 - 180 mg/dL   Results for Robert Brewer, Robert Brewer (MRN [id number]) as of 12/19/2018 11:11  Ref. Range 12/18/2018 06:00 12/18/2018 12:54 12/18/2018 16:27 12/18/2018 21:34 12/19/2018 06:06  Glucose-Capillary Latest Ref Range: 70 - 99 mg/dL 231 (H) 314 (H) 381 (H) 239 (H) 250 (H)   Review of Glycemic Control  Diabetes history: DM2 Outpatient Diabetes medications: None Current orders for Inpatient glycemic control: Lantus 15 units daily, Novolog 0-9 units TID with meals  Inpatient Diabetes Program Recommendations:   Insulin - Basal: Please consider increasing Lantus to 20 units daily.  Correction (SSI): Please consider ordering Novolog 0-5 units QHS for bedtime correction.  HbgA1C: Please consider ordering an A1C to evaluate glycemic control over the past 2-3 months.  NOTE: Will plan to see patient today to discuss DM and insulin. Will ask that RNs please use each patient interaction to provide diabetes and insulin education. Please allow patient to be actively engaged with diabetes management by allowing patient to check own glucose and self-administer insulin injections.  Addendum 12/19/18_0 :30-Spoke with patient regarding insulin. Patient states that he willing to take insulin at home and that his wife has DM and he gives her insulin injections. Patient was able to verbalize and demonstrate (with assistance due to limited mobility in right hand and arm) how to proper use an insulin pen for insulin injections. Patient states that he gave himself an insulin injection with syringe today. Patient has an insulin starter kit. Reviewed glucose goals and informed patient that a current A1C is not available but has been requested. Reviewed hypoglycemia along with  proper treatment.  Patient states that he feels comfortable with taking insulin injections at home but notes that he is hopefully to regain mobility of right arm and leg.  Patient verbalized understanding of information discussed and states that he has no other questions or concerns at this time.  Thanks, Barnie Alderman, RN, MSN, CDE Diabetes Coordinator Inpatient Diabetes Program [phone] (Team Pager from 8am to 5pm)

## 2018-12-19 NOTE — PMR Pre-admission (Signed)
PMR Admission Coordinator Pre-Admission Assessment  Patient: Robert Brewer is an 73 y.o., male MRN: [id number] DOB: 1946/05/12 Height: 5' 11"  (180.3 cm) Weight: 96.4 kg  Insurance Information HMO: yes    PPO:      PCP:      IPA:      80/20:      OTHER:  PRIMARY: [employer]      Policy#: [id number]      Subscriber: Patient CM Name: Mortimer Fries      Phone#: [phone] ext [phone]     Fax#: [phone] Pre-Cert#: [id number]      Employer:  Josem Kaufmann provided by Mortimer Fries for admit to CIR. Pt is approved 6/1 with follow up clinicals due 6/8 and every week til DC to [facility] (p): [phone] (f): [phone] Benefits:  Phone #: NA     Name: [email or web address] Eff. Date: 07/25/2017 still active     Deduct: $0      Out of Pocket Max: $3,400 ($0 met)      Life Max: NA CIR: $295/day for days 1-6, $0/day for days 7+      SNF: $0/day for days 1-20, $178/day for days 21-100, limited to 100 days/cal yr  Outpatient: limited by medical necessity     Co-Pay: $10-40/visit pending service Home Health: 100%, limited by medical necessity      Co-Pay: 0% DME: 80%     Co-Pay: 20% Providers:  SECONDARY: None      Policy#:       Subscriber:  CM Name:       Phone#:      Fax#:  Pre-Cert#:       Employer:  Benefits:  Phone #:      Name:  Eff. Date:      Deduct:       Out of Pocket Max:       Life Max:  CIR:       SNF:  Outpatient:      Co-Pay:  Home Health:       Co-Pay:  DME:      Co-Pay:   Medicaid Application Date:       Case Manager:  Disability Application Date:       Case Worker:   The "Data Collection Information Summary" for patients in Inpatient Rehabilitation Facilities with attached "Privacy Act [place] Records" was provided and verbally reviewed with: Patient  Emergency Contact Information Contact Information    Name Relation Home Work Mobile   Spencer Spouse [phone]       Current Medical History  Patient Admitting Diagnosis: 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  Patient on no prescription medications. Pt presented to [facility] 12/17/2018 with acute onset of right side weakness, dysarthria and facial droop while mowing the grass.  CT of the head at outside hospital showed no hemorrhage.  Patient did receive TPA and was transferred to [facility].  CT angiogram of head and neck showed 1 cm hypodensity involving the left paramedian pons and suspicious for evolving acute ischemic infarct.  CTA head and neck with no large vessel occlusion no significant intraluminal thrombus or dissection identified.  MRI confirms acute infarct left paramedian pons small acute cortical infarct left occipital lobe.  Negative for hemorrhage.  Currently maintained on aspirin for CVA prophylaxis.  Echocardiogram with ejection fraction of 46% normal systolic function.  Negative bubble study.  Recommendations had been made for loop recorder placement and patient refused.  Hemoglobin A1c 13.3 with insulin therapy as directed.  Tolerating a regular consistency diet.  Therapy evaluations completed and patient has been recommended for CIR. Pt is to be admitted for a comprehensive rehab program on 12/24/18.  Complete NIHSS TOTAL: 9  Patient's medical record from [facility] has been reviewed by the rehabilitation admission coordinator and physician.  Past Medical History  History reviewed. No pertinent past medical history.  Family History   family history includes Congestive Heart Failure in his father; Other in his mother; Prostate cancer in his brother and father; Valvular heart disease in his sister.  Prior Rehab/Hospitalizations Has the patient had prior rehab or hospitalizations prior to admission? No  Has the patient had major surgery during 100 days prior to admission?  No   Current Medications  Current Facility-Administered Medications:  .   stroke: mapping our early stages of recovery book, , Does not apply, Once, Aroor, Karena Addison R, MD .  acetaminophen (TYLENOL) tablet 650 mg, 650 mg, Oral, Q4H PRN **OR** acetaminophen (TYLENOL) solution 650 mg, 650 mg, Per Tube, Q4H PRN **OR** acetaminophen (TYLENOL) suppository 650 mg, 650 mg, Rectal, Q4H PRN, Aroor, Karena Addison R, MD .  aspirin EC tablet 81 mg, 81 mg, Oral, Daily, Greta Doom, MD, 81 mg at 12/24/18 0837 .  insulin aspart (novoLOG) injection 0-5 Units, 0-5 Units, Subcutaneous, QHS, Garvin Fila, MD, 2 Units at 12/22/18 2104 .  insulin aspart (novoLOG) injection 0-9 Units, 0-9 Units, Subcutaneous, TID WC, Garvin Fila, MD, 3 Units at 12/24/18 1151 .  insulin aspart (novoLOG) injection 7 Units, 7 Units, Subcutaneous, TID WC, Rosalin Hawking, MD, 7 Units at 12/24/18 1151 .  insulin glargine (LANTUS) injection 30 Units, 30 Units, Subcutaneous, Daily, Rosalin Hawking, MD, 30 Units at 12/24/18 [phone] .  pantoprazole (PROTONIX) EC tablet 40 mg, 40 mg, Oral, QHS, Garvin Fila, MD, 40 mg at 12/23/18 2151 .  polyethylene glycol (MIRALAX / GLYCOLAX) packet 17 g, 17 g, Oral, Daily PRN, Garvin Fila, MD, 17 g at 12/23/18 1414 .  senna-docusate (Senokot-S) tablet 1 tablet, 1 tablet, Oral, BID, Rosalin Hawking, MD, 1 tablet at 12/24/18 9983  Patients Current Diet:  Diet Order            Diet Carb Modified Fluid consistency: Thin; Room service appropriate? Yes with Assist  Diet effective now              Precautions / Restrictions Precautions Precautions: Fall Precaution Comments: right hemiparesis, double vision better but not cleared Restrictions Weight Bearing Restrictions: No   Has the patient had 2 or more falls or a fall with injury in the past year? No  Prior Activity Level Community (5-7x/wk): retired but very active and independent PTA; drove; did yard work  Prior Functional Level Self  Care: Did the patient need help bathing, dressing, using the toilet or eating? Independent  Indoor Mobility: Did the patient need assistance with walking from room to room (with or without device)? Independent  Stairs: Did the patient need assistance with internal or external stairs (with or without device)? Independent  Functional Cognition: Did the patient need help planning regular tasks such as shopping or remembering to take medications? Independent  Home Assistive Devices / Equipment Home Equipment: Cane - single point  Prior Device Use: Indicate devices/aids used by the patient prior to current illness, exacerbation or injury? None of the above  Current Functional Level Cognition  Arousal/Alertness: Awake/alert Overall Cognitive Status: Within Functional Limits for tasks assessed Orientation Level: Oriented X4 General Comments: Patient les s emotioanl today Attention: Focused, Sustained Focused Attention: Appears intact Sustained Attention: Appears intact Memory: Appears intact(Immediate: 3/3; Delayed: 3/3) Awareness: Appears intact Problem Solving: Appears intact(5/5) Executive Function: Sequencing Sequencing: Appears intact    Extremity Assessment (includes Sensation/Coordination)  Upper Extremity Assessment: Defer to OT evaluation RUE Deficits / Details: Pt with no active movement in RUE at this time. PROM for shoulder flexion to 90* due to pain (3/10). Will monitor RUE Sensation: WNL RUE Coordination: (no active movement RUE at this time)  Lower Extremity Assessment: RLE deficits/detail RLE Deficits / Details: Does have active firing of the right leg but weak compared to the left     ADLs  Overall ADL's : Needs assistance/impaired Eating/Feeding: Set up Eating/Feeding Details (indicate cue type and reason): sitting supported in chair. If sitting EOB would need close supervision to occassional min a for balance.  Grooming: Wash/dry hands, Wash/dry face, Oral care,  Minimal assistance, Set up, Sitting Grooming Details (indicate cue type and reason): in supported chair Upper Body Bathing: Minimal assistance, Sitting Upper Body Bathing Details (indicate cue type and reason): Assistance to pick up right UE.  Lower Body Bathing: Maximal assistance, Sit to/from stand Upper Body Dressing : Moderate assistance, Sitting Lower Body Dressing: Maximal assistance, Sit to/from stand Toilet Transfer: Maximal assistance, Cueing for sequencing, Cueing for safety Toilet Transfer Details (indicate cue type and reason): Simulated to recliner with PT completing. Transferred from bed to recliner going towards the left. See PT note. Toileting- Clothing Manipulation and Hygiene: Maximal assistance Functional mobility during ADLs: +2 for physical assistance, Moderate assistance General ADL Comments: R bias with decreased RLE control. Pt able to weight shift to midline and then to L with cues but unable to maintain.  Pt aware he was losing balance to the left however no attempts to remediate.     Mobility  Overal bed mobility: Needs Assistance Bed Mobility: Supine to Sit Supine to sit: Min assist General bed mobility comments: min A with head of bed elevated, assist to scoot to edge of bed for Rt hip    Transfers  Overall transfer level: Needs assistance Equipment used: Hemi-walker Transfers: Sit to/from Stand, [employer] Transfers Sit to Stand: Mod assist Stand pivot transfers: Mod assist General transfer comment: sit <> stand  3 x 5 without UE assist with focus on midline orientation and Rt LE strengthening with mod A    Ambulation / Gait / Stairs / Wheelchair Mobility  Ambulation/Gait Ambulation/Gait assistance: Mod assist Gait Distance (Feet): 6 Feet Assistive device: Hemi-walker Gait Pattern/deviations: Decreased weight shift to left, Ataxic, Narrow base of support, Decreased stride length General Gait Details: pt able to advance Rt LE with min facilitation,  manual facilitation for motor planning, wt shifts and coordination Gait velocity: decreased  Gait velocity interpretation: <1.31 ft/sec, indicative of household ambulator Stairs: Yes    Posture / Balance Dynamic Sitting Balance Sitting balance - Comments: No aassistance required to remain sitting today Balance Overall balance assessment: Needs assistance Sitting-balance support: Single extremity supported, Feet supported Sitting balance-Leahy Scale: Good Sitting balance - Comments: No aassistance required to remain sitting today Postural control: Right lateral lean, Posterior lean Standing balance support: Single extremity supported Standing balance-Leahy Scale: Poor Standing balance comment: improved ability to find center. Less pushing to the right  High Level Balance Comments: standing pre gait wtih stepping with Rt LE  with mod A for coordination, Rt LE stance control. standing wt shifts without AD with focus on anterior wt shift to reduce posterior lean    Special needs/care consideration BiPAP/CPAP : no CPM :no Continuous Drip IV: no Dialysis : no        Days : no Life Vest : no Oxygen  :no Special Bed : no Trach Size : no Wound Vac (area) : no      Location : no Skin : wound to right head.   Bowel mgmt: last BM: 12/23/18, continent.  Bladder mgmt: continent. Diabetic mgmt: yes, would benefit from training/education Behavioral consideration : no Chemo/radiation : no   Previous Home Environment (from acute therapy documentation) Living Arrangements: Spouse/significant other  Lives With: Spouse Available Help at Discharge: Family, Available 24 hours/day Type of Home: House Home Layout: Two level Home Access: Stairs to enter [employer] of Steps: 1 step to back porch no railing Bathroom Shower/Tub: Multimedia programmer: Standard Bathroom Accessibility: Yes  Discharge Living Setting Plans for Discharge Living Setting: Patient's home, Lives with  (comment)(wife) Type of Home at Discharge: House Discharge Home Layout: Two level, Able to live on main level with bedroom/bathroom(with basement) Alternate Level Stairs-Rails: None Alternate Level Stairs-Number of Steps: unknown, not needed Discharge Home Access: Stairs to enter Entrance Stairs-Rails: None Entrance Stairs-Number of Steps: 2 in front, 4 in back (discussed ramp use) Discharge Bathroom Shower/Tub: Walk-in shower Discharge Bathroom Toilet: Standard Discharge Bathroom Accessibility: Yes How Accessible: Accessible via walker Does the patient have any problems obtaining your medications?: No  Social/Family/Support Systems Patient Roles: Spouse Contact Information: wife: Kalman Shan [phone] Anticipated Caregiver: wife Anticipated Caregiver's Contact Information: see above Ability/Limitations of Caregiver: min A Caregiver Availability: 24/7 Discharge Plan Discussed with Primary Caregiver: Yes Is Caregiver In Agreement with Plan?: Yes Does Caregiver/Family have Issues with Lodging/Transportation while Pt is in Rehab?: No  Goals/Additional Needs Patient/Family Goal for Rehab: PT/OT: Supervision/Min A; SLP: Supervision Expected length of stay: 16-20 days Cultural Considerations: religious  Dietary Needs: carb modified, thin liquids; calorie level 1600-2000; room service with assist. Equipment Needs: TBD Pt/Family Agrees to Admission and willing to participate: Yes Program Orientation Provided & Reviewed with Pt/Caregiver Including Roles  & Responsibilities: Yes(pt and wife)  Barriers to Discharge: Home environment access/layout, Lack of/limited family support, Medication compliance  Barriers to Discharge Comments: not compliant with insulin in the past; wife can only provide Min A   Decrease burden of Care through IP rehab admission: NA  Possible need for SNF placement upon discharge: Not anticipated if pt able to achieve Min A goals through CIR.  Wife open to ramp use at home; hoping for light Min A at DC.   Patient Condition: I have reviewed medical records from [facility], spoken with CSW, and patient and spouse. I met with patient at the bedside for inpatient rehabilitation assessment.  Patient will benefit from ongoing PT, OT and SLP, can actively participate in 3 hours of therapy a day 5 days of the week, and can make measurable gains during the admission.  Patient will also benefit from the coordinated team approach during an Inpatient Acute Rehabilitation admission.  The patient will receive intensive therapy as well as Rehabilitation physician, nursing, social worker, and care management interventions.  Due to bladder management, bowel management, safety, skin/wound care, disease management, medication administration, pain management and patient education the patient requires 24 hour a day rehabilitation nursing.  The patient is currently Mod A with mobility and  Min/Max A for basic ADLs.  Discharge setting and therapy post discharge at home with home health is anticipated.  Patient has agreed to participate in the Acute Inpatient Rehabilitation Program and will admit 12/24/18.  Preadmission Screen Completed By:  Jhonnie Garner, 12/24/2018 3:39 PM ______________________________________________________________________   Discussed status with Dr. Naaman Plummer on 12/24/18 at 3:37PM and received approval for admission today.  Admission Coordinator:  Jhonnie Garner, OT, time 3:37PM/Date 12/24/18   Assessment/Plan: Diagnosis: left pontine and occipital infarct 1. Does the need for close, 24 hr/day Medical supervision in concert with the patient's rehab needs make it unreasonable for this patient to be served in a less intensive setting? Yes 2. Co-Morbidities requiring supervision/potential complications: dm, htn 3. Due to bladder management, bowel management, safety, skin/wound care, disease management, medication administration, pain management  and patient education, does the patient require 24 hr/day rehab nursing? Yes 4. Does the patient require coordinated care of a physician, rehab nurse, PT (1-2 hrs/day, 5 days/week), OT (1-2 hrs/day, 5 days/week) and SLP (1-2 hrs/day, 5 days/week) to address physical and functional deficits in the context of the above medical diagnosis(es)? Yes Addressing deficits in the following areas: balance, endurance, locomotion, strength, transferring, bowel/bladder control, bathing, dressing, feeding, grooming, toileting, cognition, speech and psychosocial support 5. Can the patient actively participate in an intensive therapy program of at least 3 hrs of therapy 5 days a week? Yes 6. The potential for patient to make measurable gains while on inpatient rehab is excellent 7. Anticipated functional outcomes upon discharge from inpatients are: supervision and min assist PT, supervision and min assist OT, supervision SLP 8. Estimated rehab length of stay to reach the above functional goals is:16-20 days 9. Anticipated D/C setting: Home 10. Anticipated post D/C treatments: [place] therapy 11. Overall Rehab/Functional Prognosis: excellent  MD Signature: Meredith Staggers, MD, [facility] 12/24/2018

## 2018-12-19 NOTE — Progress Notes (Signed)
Inpatient Rehab Admissions:  Inpatient Rehab Consult received.  I met with pt at the bedside for rehabilitation assessment and to discuss goals and expectations of an inpatient rehab admission.  Pt interested in CIR at this time and would like to pursue. Per his request, [facility] contacted his wife to confirm support and discuss plan. Wife confirmed support and is in agreement for CIR.   AC will begin insurance authorization process for possible admit.   Jhonnie Garner, OTR/L  Rehab Admissions Coordinator  [phone] 12/19/2018 4:44 PM

## 2018-12-20 ENCOUNTER — Encounter (HOSPITAL_COMMUNITY): Payer: Self-pay | Admitting: Physician Assistant

## 2018-12-20 DIAGNOSIS — I6389 Other cerebral infarction: Secondary | ICD-10-CM

## 2018-12-20 LAB — GLUCOSE, CAPILLARY
Glucose-Capillary: 236 mg/dL — ABNORMAL HIGH (ref 70–99)
Glucose-Capillary: 340 mg/dL — ABNORMAL HIGH (ref 70–99)
Glucose-Capillary: 290 mg/dL — ABNORMAL HIGH (ref 70–99)
Glucose-Capillary: 278 mg/dL — ABNORMAL HIGH (ref 70–99)

## 2018-12-20 LAB — HEMOGLOBIN A1C
Hgb A1c MFr Bld: 13.3 % — ABNORMAL HIGH (ref 4.8–5.6)
Mean Plasma Glucose: 335 mg/dL

## 2018-12-20 MED ORDER — STROKE: EARLY STAGES OF RECOVERY BOOK: 1.0000 | Freq: Once | Status: AC

## 2018-12-20 MED ORDER — ACETAMINOPHEN 325 MG PO TABS: 650.0000 mg | ORAL_TABLET | ORAL | Status: DC | PRN

## 2018-12-20 MED ORDER — ASPIRIN 81 MG PO TBEC: 81.0000 mg | DELAYED_RELEASE_TABLET | Freq: Every day | ORAL | Status: DC

## 2018-12-20 MED ORDER — PANTOPRAZOLE SODIUM 40 MG PO TBEC
40.0000 mg | DELAYED_RELEASE_TABLET | Freq: Every day | ORAL | Status: DC
  Administered 2018-12-20 – 2018-12-23 (×4): 40 mg via ORAL
  Filled 2018-12-20 (×4): qty 1

## 2018-12-20 MED ORDER — INSULIN GLARGINE 100 UNIT/ML ~~LOC~~ SOLN: 15.0000 [IU] | Freq: Every day | SUBCUTANEOUS | 11 refills | Status: DC

## 2018-12-20 MED ORDER — SENNOSIDES-DOCUSATE SODIUM 8.6-50 MG PO TABS: 1.0000 | ORAL_TABLET | Freq: Every evening | ORAL | Status: DC | PRN

## 2018-12-20 MED ORDER — PANTOPRAZOLE SODIUM 40 MG PO TBEC: 40.0000 mg | DELAYED_RELEASE_TABLET | Freq: Every day | ORAL | Status: DC

## 2018-12-20 MED ORDER — INSULIN ASPART 100 UNIT/ML ~~LOC~~ SOLN: 0.0000 [IU] | Freq: Three times a day (TID) | SUBCUTANEOUS | 11 refills | Status: DC

## 2018-12-20 NOTE — Progress Notes (Signed)
Physical Therapy Treatment Patient Details Name: Robert Brewer MRN: [id number] DOB: Sep 29, 1945 Today's Date: 12/20/2018    History of Present Illness Pt is a 73 y.o. male with past medical history of diabetes mellitus, HTN not compliant with medications who presented to the emergency department with right-sided weakness and facial droop. CT of the head revealed subtle approximate 1 cm hypodensity involving the left paramedian pons, increased in prominence from previous, and suspicious for evolving acute ischemic infarct. IV tPA was administered and he presented with severe dysarthria when he was seen by neurology.      PT Comments    Patient is making progress. He increased his gait distance and was able to ambulate to the chair. He also ambulated a straight with improved ability to move his right leg. As he fatigued his gait became worse. He also had an improved ability to lift his right left sitting in the chair. He remains a great candidate for inpatient rehab. Actue therapy will continue to work with the patient.   Follow Up Recommendations  CIR     Equipment Recommendations  Rolling walker with 5" wheels;3in1 (PT)    Recommendations for Other Services Rehab consult     Precautions / Restrictions Precautions Precautions: Fall Precaution Comments: right hemiparesis, double vision Restrictions Weight Bearing Restrictions: No    Mobility  Bed Mobility Overal bed mobility: Needs Assistance Bed Mobility: Supine to Sit     Supine to sit: HOB elevated;Mod assist     General bed mobility comments: Assistance to bring RUE forward when sitting on EOB.   Transfers Overall transfer level: Needs assistance Equipment used: Hemi-walker   Sit to Stand: Mod assist;+2 physical assistance         General transfer comment: mod a for strength and initial balance. Patient stood with therapy 3x. On the first trial his feet were not set right and he had to sit and reset. He did better  with foot placement on the next two trials.   Ambulation/Gait Ambulation/Gait assistance: Mod assist Gait Distance (Feet): 7 Feet(3' to chair 4' forward ) Assistive device: Hemi-walker Gait Pattern/deviations: Decreased weight shift to left;Ataxic;Narrow base of support;Decreased stride length Gait velocity: decreased    General Gait Details: improved ability to lift right leg today. Weight more centered with less cuing. Patient able to take steps to the chair. After perfroming self care activity he was abel to walk 4 more feet to the wall. As he fatigued his legs became more corssed and he had to sit.    Stairs Stairs: Yes           Wheelchair Mobility    Modified Rankin (Stroke Patients Only) Modified Rankin (Stroke Patients Only) Pre-Morbid Rankin Score: No symptoms Modified Rankin: Severe disability     Balance Overall balance assessment: Needs assistance Sitting-balance support: Single extremity supported;Feet supported Sitting balance-Leahy Scale: Fair Sitting balance - Comments: Sitting on EOB. No LOB. No leaning noted during session.    Standing balance support: Single extremity supported Standing balance-Leahy Scale: Poor Standing balance comment: improved ability to find center. Less pushing to the right                             Cognition Arousal/Alertness: Awake/alert Behavior During Therapy: WFL for tasks assessed/performed Overall Cognitive Status: Within Functional Limits for tasks assessed  General Comments: Patient les s emotioanl today      Exercises Other Exercises Other Exercises: BUE scapular elevation and retraction completed while seated; 10X each Other Exercises: Patient unable to achieve active shoulder extension or flexion. 1/5 muscle grade for shoulder flexion, 0/5 muscle grade for shoulder extension.  Other Exercises: Noted slight right hand 5th digit active extension when  attempting to complete shoulder flexion.     General Comments        Pertinent Vitals/Pain Pain Assessment: No/denies pain    Home Living                      Prior Function            PT Goals (current goals can now be found in the care plan section) Acute Rehab PT Goals PT Goal Formulation: With patient Time For Goal Achievement: 12/25/18 Potential to Achieve Goals: Good Progress towards PT goals: Progressing toward goals    Frequency    Min 4X/week      PT Plan Current plan remains appropriate    Co-evaluation PT/OT/SLP Co-Evaluation/Treatment: Yes Reason for Co-Treatment: Complexity of the patient's impairments (multi-system involvement);To address functional/ADL transfers;For patient/therapist safety PT goals addressed during session: Balance;Mobility/safety with mobility;Strengthening/ROM;Proper use of DME OT goals addressed during session: ADL's and self-care;Strengthening/ROM      AM-PAC PT "6 Clicks" Mobility   Outcome Measure  Help needed turning from your back to your side while in a flat bed without using bedrails?: A Lot Help needed moving from lying on your back to sitting on the side of a flat bed without using bedrails?: A Lot Help needed moving to and from a bed to a chair (including a wheelchair)?: A Lot Help needed standing up from a chair using your arms (e.g., wheelchair or bedside chair)?: A Lot Help needed to walk in hospital room?: Total Help needed climbing 3-5 steps with a railing? : Total 6 Click Score: 10    End of Session Equipment Utilized During Treatment: Gait belt Activity Tolerance: Patient tolerated treatment well Patient left: in chair;with call bell/phone within reach;with chair alarm set Nurse Communication: Mobility status PT Visit Diagnosis: Unsteadiness on feet (R26.81);Other abnormalities of gait and mobility (R26.89);Muscle weakness (generalized) (M62.81)     Time: 0900-0930 PT Time Calculation (min)  (ACUTE ONLY): 30 min  Charges:  $Therapeutic Activity: 8-22 mins                       Carney Living PT DPT  12/20/2018, 9:42 AM

## 2018-12-20 NOTE — Care Management Important Message (Signed)
Important Message  Patient Details  Name: Robert Brewer MRN: [id number] Date of Birth: Nov 17, 1945   Medicare Important Message Given:  Yes    [name] 12/20/2018, 2:59 PM

## 2018-12-20 NOTE — Progress Notes (Signed)
  Speech Language Pathology Treatment: Cognitive-Linquistic(Dysarthria)  Patient Details Name: Robert Brewer MRN: [id number] DOB: 1946/04/19 Today's Date: 12/20/2018 Time: 8251-8984 SLP Time Calculation (min) (ACUTE ONLY): 19 min  Assessment / Plan / Recommendation Clinical Impression  Pt was seen for dysarthria treatment and was cooperative throughout the session. Speech intelligibility was improved during this session and the pt expressed that his blurry vision was improved with only an hour of visual changes at the beginning of the day.  He completed labial and lingual ROM and strengthening exercises with min-mod cues. He independently recalled compensatory strategies for speech intelligibility. He used compensatory strategies for speech intelligibility at the phrase level with 89% accuracy increasing to 100% accuracy with min. cues to overarticulate. At the 5-7 word sentence level he achieved 78% accuracy with self-correction increasing to 100% accuracy with min. cues. With longer sentences he achieved 90% accuracy increasing to 100% accuracy with min. cues but increased self-correction was noted at this level. SLP will continue to follow.    HPI HPI: Pt is a 73 y.o. male with past medical history of diabetes mellitus, HTN not compliant with medications who presented to the emergency department with right-sided weakness and facial droop. CT of the head revealed subtle approximate 1 cm hypodensity involving the left paramedian pons, increased in prominence from previous, and suspicious for evolving acute ischemic infarct. IV tPA was administered and he presented with severe dysarthria when he was seen by neurology.       SLP Plan  Continue with current plan of care       Recommendations                   Follow up Recommendations: Inpatient Rehab SLP Visit Diagnosis: Dysarthria and anarthria (R47.1) Plan: Continue with current plan of care       Arita Severtson I. Hardin Negus, [place],  [place] Office number [phone] Pager [place] 12/20/2018, 11:49 AM

## 2018-12-20 NOTE — Progress Notes (Signed)
Inpatient Rehabilitation-Admissions Coordinator   Still no determination from insurance at this time. Will follow up.    Please call if questions.   Jhonnie Garner, OTR/L  Rehab Admissions Coordinator  [phone] 12/20/2018 4:38 PM

## 2018-12-20 NOTE — Consult Note (Addendum)
ELECTROPHYSIOLOGY CONSULT NOTE  Patient ID: Robert Brewer MRN: [id number], DOB/AGE: 71-Jan-1947   Admit date: 12/17/2018 Date of Consult: 12/20/2018  Primary Physician: Patient, No Pcp Per Primary Cardiologist: none Reason for Consultation: Cryptogenic stroke ; recommendations regarding Implantable Loop Recorder, requested by Dr. Leonie Man  History of Present Illness Robert Brewer was admitted on 12/17/2018 with stroke.   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  he has undergone workup for stroke including echocardiogram and carotid angio.  The patient has been monitored on telemetry which has demonstrated sinus rhythm with no arrhythmias.  Inpatient stroke work-up Robert Brewer not include TEE given COVID 19 restrictions   Echocardiogram this admission demonstrated IMPRESSIONS    1. The left ventricle has normal systolic function with an ejection fraction of 60-65%. The cavity size was normal. Left ventricular diastolic Doppler parameters are consistent with impaired relaxation. No evidence of left ventricular regional wall  motion abnormalities.  2. The right ventricle has normal systolic function. The cavity was normal. There is no increase in right ventricular wall thickness.  3. Left atrial size was mildly dilated.  4. Mild thickening of the aortic valve. Mild calcification of the aortic valve. Aortic valve regurgitation was not assessed by color flow Doppler.  5. Negative bubble study.  6. No intracardiac thrombi or masses were visualized.  FINDINGS  Left Ventricle: The left ventricle has normal systolic function, with an ejection fraction of 60-65%. The cavity size was normal. There is no increase in left ventricular wall thickness. Left ventricular diastolic Doppler parameters are  consistent with  impaired relaxation. No evidence of left ventricular regional wall motion abnormalities..  Right Ventricle: The right ventricle has normal systolic function. The cavity was normal. There is no increase in right ventricular wall thickness.  Left Atrium: Left atrial size was mildly dilated.  Right Atrium: Right atrial size was normal in size. Right atrial pressure is estimated at 3 mmHg.  Interatrial Septum: No atrial level shunt detected by color flow Doppler. Agitated saline contrast was given intravenously to evaluate for intracardiac shunting. Saline contrast bubble study was negative, with no evidence of any interatrial shunt.  Pericardium: There is no evidence of pericardial effusion.  Mitral Valve: The mitral valve is normal in structure. Mitral valve regurgitation was not assessed by color flow Doppler.  Tricuspid Valve: The tricuspid valve is normal in structure. Tricuspid valve regurgitation was not visualized by color flow Doppler.  Aortic Valve: The aortic valve is normal in structure. Mild thickening of the aortic valve. Mild calcification of the aortic valve. Aortic valve regurgitation was not assessed by color flow Doppler.  Pulmonic Valve: The pulmonic valve was normal in structure. Pulmonic valve regurgitation is trivial by color flow Doppler.  Venous: The inferior vena cava is normal in size with greater than 50% respiratory variability.  Additional Comments: Negative bubble study.   Lab work is reviewed.  Prior to admission, the patient denies chest pain, shortness of breath, dizziness, palpitations, or syncope.  He is recovering from their stroke with plans to CIR at discharge.    Surgical History: History reviewed. No pertinent surgical history.   No medications prior to admission.    Inpatient Medications:    stroke: mapping our early stages of recovery book   Does not apply Once   aspirin EC  81 mg Oral Daily   insulin  aspart  0-9 Units Subcutaneous TID WC   insulin glargine  15 Units Subcutaneous Daily   pantoprazole  40 mg Oral QHS    Allergies: Not on File  Social History   Socioeconomic History   Marital status: Married    Spouse name: Not on file   Number of children: Not on file   Years of education: Not on file   Highest education level: Not on file  Occupational History   Not on file  Social Needs   Financial resource strain: Not on file   Food insecurity:    Worry: Not on file    Inability: Not on file   Transportation needs:    Medical: Not on file    Non-medical: Not on file  Tobacco Use   Smoking status: Not on file  Substance and Sexual Activity   Alcohol use: Not on file   Drug use: Not on file   Sexual activity: Not on file  Lifestyle   Physical activity:    Days per week: Not on file    Minutes per session: Not on file   Stress: Not on file  Relationships   Social connections:    Talks on phone: Not on file    Gets together: Not on file    Attends religious service: Not on file    Active member of club or organization: Not on file    Attends meetings of clubs or organizations: Not on file    Relationship status: Not on file   Intimate partner violence:    Fear of current or ex partner: Not on file    Emotionally abused: Not on file    Physically abused: Not on file    Forced sexual activity: Not on file  Other Topics Concern   Not on file  Social History Narrative   Not on file     Family History  Problem Relation Age of Onset   Other Mother        healthy, lived to her 25's   Prostate cancer Father        lived to his 58's   Congestive Heart Failure Father    Valvular heart disease Sister    Prostate cancer Brother       Review of Systems: All other systems reviewed and are otherwise negative except as noted above.  Physical Exam: Vitals:   12/19/18 2353 12/20/18 0339 12/20/18 0734 12/20/18 1153  BP: 112/64 124/73  124/65 119/84  Pulse: (!) 53 (!) 56 (!) 50 60  Resp: 18 19 15 20   Temp: 98 F (36.7 C) 97.9 F (36.6 C) 97.7 F (36.5 C) (!) 97.4 F (36.3 C)  TempSrc: Oral Oral Oral Oral  SpO2: 93% 95% 93% 96%  Weight:      Height:       Limited exam given COVID19 pandemic GEN- The patient is well appearing, alert and oriented x 3 today.   Head- normocephalic, atraumatic Eyes-  Sclera clear, conjunctiva pink Ears- hearing intact Oropharynx- not examined Neck- supple Lungs- CTA b/l, normal work of breathing Heart- RRR, no murmurs, rubs or gallops  GI- not examined Extremities- no clubbing, cyanosis, or edema MS- no significant deformity or atrophy Skin- no rash or lesion Psych- euthymic mood, full affect   Labs:   Lab Results  Component Value Date   HGB 13.6 12/17/2018   HCT 40.0 12/17/2018    Recent Labs  Lab 12/17/18 0454  NA 134*  K 3.9  CL 97*  CO2 24  BUN 19  CREATININE 0.79  CALCIUM 8.9  GLUCOSE 383*   No results found for: CKTOTAL, CKMB, CKMBINDEX, TROPONINI Lab Results  Component Value Date   CHOL 227 (H) 12/17/2018   Lab Results  Component Value Date   HDL 45 12/17/2018   Lab Results  Component Value Date   LDLCALC 166 (H) 12/17/2018   Lab Results  Component Value Date   TRIG 81 12/17/2018   Lab Results  Component Value Date   CHOLHDL 5.0 12/17/2018   No results found for: LDLDIRECT  No results found for: DDIMER   Radiology/Studies:   Ct Angio Head W Or Wo Contrast Result Date: 12/17/2018 CLINICAL DATA:  Follow-up examination for acute stroke. EXAM: CT ANGIOGRAPHY HEAD AND NECK TECHNIQUE: Multidetector CT imaging of the head and neck was performed using the standard protocol during bolus administration of intravenous contrast. Multiplanar CT image reconstructions and MIPs were obtained to evaluate the vascular anatomy. Carotid stenosis measurements (when applicable) are obtained utilizing NASCET criteria, using the distal internal carotid diameter  as the denominator. CONTRAST:  71mL OMNIPAQUE IOHEXOL 350 MG/ML SOLN COMPARISON:  Prior CT and CTA from 12/16/2018. FINDINGS: CT HEAD FINDINGS Brain: Age-related cerebral atrophy again noted. There is a subtle 1 cm hypodensity involving the ventral left paramedian pons, suspicious for evolving acute ischemic infarct (series 5, image 11). No other acute large vessel territory infarct. No acute intracranial hemorrhage. No mass lesion, midline shift or mass effect. No hydrocephalus. No extra-axial fluid collection. Vascular: No hyperdense vessel. Skull: Scalp soft tissues and calvarium within normal limits. Sinuses: Chronic fronto ethmoidal sinusitis. Paranasal sinuses are otherwise clear. No mastoid effusion. Orbits: Globes and orbital soft tissues demonstrate no acute finding. Review of the MIP images confirms the above findings CTA NECK FINDINGS Aortic arch: Visualized aortic arch of normal caliber with normal 3 vessel morphology. Mild calcified atherosclerotic plaque within the aortic arch. No flow-limiting stenosis about the origin of the great vessels. Visualized subclavian arteries widely patent. Right carotid system: Right common carotid artery widely patent from its origin to the bifurcation without abnormality. Mild mixed plaque about the right bifurcation/proximal right ICA without hemodynamically significant stenosis. Right ICA widely patent distally to the skull base. Left carotid system: Left common carotid artery widely patent to the bifurcation. Mild soft plaque about the origin of the left ICA without hemodynamically significant stenosis. Proximal left ICA medialized into the retropharyngeal space. Left ICA widely patent distally to the skull base. Vertebral arteries: Both vertebral arteries arise from the subclavian arteries and are largely code dominant. Vertebral arteries widely patent within the neck without stenosis, dissection, or occlusion. Skeleton: No acute osseous finding. No discrete osseous  lesions. Mild cervical spondylolysis noted at C5-6 and C6-7. Other neck: No other acute soft tissue abnormality within the neck. Upper chest: Visualized upper chest demonstrates no acute finding. Partially visualized lungs are clear. Review of the MIP images confirms the above findings CTA HEAD FINDINGS Anterior circulation: Internal carotid arteries widely patent to the termini without stenosis. Widely patent right A1 segment. Left A1 hypoplastic and/or absent, accounting for the slightly diminutive left ICA is compared to the right. Normal anterior communicating artery. Anterior cerebral arteries widely patent to their distal aspects. No M1 stenosis or occlusion. Distal MCA branches well perfused and symmetric. Posterior circulation: Vertebral arteries widely patent to the vertebrobasilar junction without stenosis. Patent right PICA. Left PICA not seen. Dominant left anterior inferior cerebral artery.  Previously seen focal filling defect involving the proximal-basilar artery no longer clearly visualized, and appears to have cleared in the interim. There is a mild short-segment mild residual stenosis at this level with minimal intimal irregularity (series 11, image 129). Previously seen stenosis at this level has improved, now measuring approximately 30-40%. No significant thrombus seen protruding into the lumen. No raised dissection flap. Basilar artery widely patent distally. Superior cerebral arteries and posterior cerebral arteries are widely patent and perfused to their distal aspects. Venous sinuses: Patent. Anatomic variants: Hypoplastic/absent left A1 with the anterior communicating artery supplied via the right carotid artery system. No intracranial aneurysm or other vascular abnormality. Delayed phase: No abnormal enhancement. Review of the MIP images confirms the above findings IMPRESSION: CT HEAD IMPRESSION: 1. Subtle approximate 1 cm hypodensity involving the left paramedian pons, increased in  prominence from previous, and suspicious for evolving acute ischemic infarct. 2. Otherwise stable appearance of the head with associated age-related cerebral atrophy. No other acute abnormality identified. CTA HEAD AND NECK IMPRESSION: 1. 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 No significant intraluminal thrombus or raised dissection flap identified. 2. No large vessel occlusion. No other hemodynamically significant or correctable stenosis. Electronically Signed   By: Jeannine Boga M.D.   On: 12/17/2018 06:11     Mr Brain Wo Contrast Result Date: 12/17/2018 CLINICAL DATA:  Stroke EXAM: MRI HEAD WITHOUT CONTRAST TECHNIQUE: Multiplanar, multiecho pulse sequences of the brain and surrounding structures were obtained without intravenous contrast. COMPARISON:  CT head 12/17/2018 FINDINGS: Brain: Moderately large acute infarct left paramedian pons. Small acute cortical infarct left occipital lobe. Negative for hemorrhage or mass lesion.  Ventricle size normal. Vascular: Normal arterial flow voids Skull and upper cervical spine: Negative Sinuses/Orbits: Mild mucosal edema paranasal sinuses. Negative orbit Other: None IMPRESSION: Acute infarct left paramedian pons. Small acute cortical infarct left occipital lobe. Negative for hemorrhage. Electronically Signed   By: Franchot Gallo M.D.   On: 12/17/2018 17:53    12-lead ECG none in Epic, physical chart has a ekg from [facility] is SR All prior EKG's in EPIC reviewed with no documented atrial fibrillation  Telemetry SR only  Assessment and Plan:  1. Cryptogenic stroke The patient presents with cryptogenic stroke.  Dr. Curt Bears spoke at length with the patient about monitoring for afib with either a 30 day event monitor or an implantable loop recorder.  Risks, benefits, and alteratives to implantable loop recorder  were discussed with the patient today.   At this time, the patient wanted an opportunity to discuss with his wife prior to a final decision.    I have had an opportunity to revisit with the patient since Dr. Macky Lower visit this morning.  After discussion with his wife they have decided not to pursue monitoring of any kind at this juncture.  I re-discussed rational for long term monitoring for AFib surveillance, discussed that AFib can go for long periods of time in-between episodes and recommend at least 30 day monitor to start.  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   His wife is a patient of Dr. Bettina Gavia, he thinks that they Kenyatte Chatmon try to get him in a  New patient to discuss his thoughts (whom they trust very much).  I offered to help with that, but again, he declines and says he and his wife Delaynee Alred follow up with it.  Baldwin Jamaica, PA-C 12/20/2018  I have seen and examined this patient with Tommye Standard.  Agree with above, note added to reflect my findings.  On exam, RRR, no murmurs, lungs clear.  Patient presented to the hospital with cryptogenic stroke. To date, no cause has been found. TEE planned for today. If unrevealing, [facility] plan for LINQ monitor to look for atrial fibrillation. Risks and benefits discussed. Risks include but not limited to bleeding and infection. The patient understands the risks and and would like to discuss the procedure with his physician post discharge.  Edgerrin Correia M. Johnhenry Tippin MD 12/20/2018 1:35 PM

## 2018-12-20 NOTE — Progress Notes (Signed)
Occupational Therapy Treatment Patient Details Name: Robert Brewer MRN: [id number] DOB: 1946/02/08 Today's Date: 12/20/2018    History of present illness Pt is a 73 y.o. male with past medical history of diabetes mellitus, HTN not compliant with medications who presented to the emergency department with right-sided weakness and facial droop. CT of the head revealed subtle approximate 1 cm hypodensity involving the left paramedian pons, increased in prominence from previous, and suspicious for evolving acute ischemic infarct. IV tPA was administered and he presented with severe dysarthria when he was seen by neurology.     OT comments  Pt in bed upon therapy arrival and agreeable to participate in OT session. Patient showed improvement with functional mobility during session per PT. Pt able to achieve scapular active movement although no shoulder or elbow movement noted during session. No increased tone. Patient was able to transfer to recliner during session with use of hemi walker. Overall, does well with cueing to correct posture. Reports that his double vision is not an issue today. Patient continues to progress towards therapy goals and OT will continue to follow him acutely until discharge. Pt left in recliner with chair alarm set and call light within reach.   Follow Up Recommendations  CIR          Precautions / Restrictions Precautions Precautions: Fall Precaution Comments: right hemiparesis, double vision Restrictions Weight Bearing Restrictions: No       Mobility Bed Mobility Overal bed mobility: Needs Assistance Bed Mobility: Supine to Sit     Supine to sit: HOB elevated;Mod assist     General bed mobility comments: Assistance to bring RUE forward when sitting on EOB.     Balance Overall balance assessment: Needs assistance Sitting-balance support: Single extremity supported;Feet supported Sitting balance-Leahy Scale: Fair Sitting balance - Comments: Sitting on EOB.  No LOB. No leaning noted during session.         ADL either performed or assessed with clinical judgement   ADL Overall ADL's : Needs assistance/impaired         Upper Body Bathing: Minimal assistance;Sitting Upper Body Bathing Details (indicate cue type and reason): Assistance to pick up right UE.              Toilet Transfer: Maximal assistance;Cueing for sequencing;Cueing for safety Toilet Transfer Details (indicate cue type and reason): Simulated to recliner with PT completing. Transferred from bed to recliner going towards the left. See PT note.         Cognition Arousal/Alertness: Awake/alert Behavior During Therapy: WFL for tasks assessed/performed Overall Cognitive Status: Within Functional Limits for tasks assessed           Exercises Other Exercises Other Exercises: BUE scapular elevation and retraction completed while seated; 10X each Other Exercises: Patient unable to achieve active shoulder extension or flexion. 1/5 muscle grade for shoulder flexion, 0/5 muscle grade for shoulder extension.  Other Exercises: Noted slight right hand 5th digit active extension when attempting to complete shoulder flexion.            Pertinent Vitals/ Pain       Pain Assessment: No/denies pain         Frequency  Min 3X/week        Progress Toward Goals  OT Goals(current goals can now be found in the care plan section)  Progress towards OT goals: Progressing toward goals     Plan Discharge plan remains appropriate;Frequency remains appropriate    Co-evaluation      Reason  for Co-Treatment: Complexity of the patient's impairments (multi-system involvement);To address functional/ADL transfers;For patient/therapist safety PT goals addressed during session: Balance;Mobility/safety with mobility;Strengthening/ROM;Proper use of DME OT goals addressed during session: ADL's and self-care;Strengthening/ROM      AM-PAC OT "6 Clicks" Daily Activity     Outcome  Measure   Help from another person eating meals?: A Little Help from another person taking care of personal grooming?: A Little Help from another person toileting, which includes using toliet, bedpan, or urinal?: A Lot Help from another person bathing (including washing, rinsing, drying)?: A Lot Help from another person to put on and taking off regular upper body clothing?: A Lot Help from another person to put on and taking off regular lower body clothing?: A Lot 6 Click Score: 14    End of Session Equipment Utilized During Treatment: Gait belt;Other (comment)(hemi walker)  OT Visit Diagnosis: Unsteadiness on feet (R26.81);Muscle weakness (generalized) (M62.81);Other symptoms and signs involving the nervous system (R29.898);Hemiplegia and hemiparesis;Pain Hemiplegia - Right/Left: Right Hemiplegia - dominant/non-dominant: Dominant Hemiplegia - caused by: Cerebral infarction   Activity Tolerance Patient tolerated treatment well   Patient Left in chair;with call bell/phone within reach;with chair alarm set   Nurse Communication          Time: 0900(co-tx with PT)-0930 OT Time Calculation (min): 30 min  Charges: OT General Charges $OT Visit: 1 Visit OT Treatments $Neuromuscular Re-education: 8-22 mins(15')  Ailene Ravel, OTR/L,CBIS  [phone]    Kerissa Coia, Clarene Duke 12/20/2018, 9:42 AM

## 2018-12-20 NOTE — Progress Notes (Addendum)
STROKE TEAM PROGRESS NOTE      SUBJECTIVE (INTERVAL HISTORY) Stable and ready for d/c to Inpt rehab.  Has slight improvement in his dysarthria but remains plegic on right.  OBJECTIVE Vitals:   12/19/18 2353 12/20/18 0339 12/20/18 0734 12/20/18 1153  BP: 112/64 124/73 124/65 119/84  Pulse: (!) 53 (!) 56 (!) 50 60  Resp: 18 19 15 20   Temp: 98 F (36.7 C) 97.9 F (36.6 C) 97.7 F (36.5 C) (!) 97.4 F (36.3 C)  TempSrc: Oral Oral Oral Oral  SpO2: 93% 95% 93% 96%  Weight:      Height:        CBC:  Recent Labs  Lab 12/17/18 0448  HGB 13.6  HCT 26.7    Basic Metabolic Panel:  Recent Labs  Lab 12/17/18 0448 12/17/18 0454  NA 134* 134*  K 4.3 3.9  CL 99 97*  CO2  --  24  GLUCOSE 394* 383*  BUN 21 19  CREATININE 0.70 0.79  CALCIUM  --  8.9    Lipid Panel:     Component Value Date/Time   CHOL 227 (H) 12/17/2018 0212   TRIG 81 12/17/2018 0212   HDL 45 12/17/2018 0212   CHOLHDL 5.0 12/17/2018 0212   VLDL 16 12/17/2018 0212   LDLCALC 166 (H) 12/17/2018 0212   HgbA1c:  Lab Results  Component Value Date   HGBA1C 13.3 (H) 12/17/2018   Urine Drug Screen: No results found for: LABOPIA, COCAINSCRNUR, LABBENZ, AMPHETMU, THCU, LABBARB  Alcohol Level No results found for: ETH  IMAGING  MRI Wo Contrast -acute infarct involving left paramedian pons, left occipital lobe    Ct Angio Head W Or Wo Contrast 12/17/2018 IMPRESSION:   CT HEAD 1. Subtle approximate 1 cm hypodensity involving the left paramedian pons, increased in prominence from previous, and suspicious for evolving acute ischemic infarct.  2. Otherwise stable appearance of the head with associated age-related cerebral atrophy. No other acute abnormality identified.   CTA HEAD AND NECK Ct Angio Neck W Or Wo Contrast 1. 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 No  significant intraluminal thrombus or raised dissection flap identified.  2. No large vessel occlusion. No other hemodynamically significant or correctable stenosis.   CXR - normal at OSH   Transthoracic Echocardiogram  Normal ejection fraction of 60 to 65%.  Mild thickening of aortic valve with calcification  EKG - SB at OSH   PHYSICAL EXAM Blood pressure 119/84, pulse 60, temperature (!) 97.4 F (36.3 C), temperature source Oral, resp. rate 20, height 5\' 11"  (1.803 m), weight 96.4 kg, SpO2 96 %.   Physical exam: Pleasant elderly Caucasian male not in distress. . Afebrile. Head is nontraumatic. Neck is supple without bruit.    Cardiac exam no murmur or gallop. Lungs are clear to auscultation. Distal pulses are well felt. Neurological Exam: Awake alert oriented to time place and person.  Severe dysarthria but can be understood with some difficulty.  Extraocular movements are full range but with saccadic dysmetria on left gaze. Mild right lower facial weakness.  Tongue midline.  Weak cough and gag.  Right hemiplegia with right upper extremity 0/5 strength with hypotonia.  Right lower extremity 2/5 strength.  Normal strength in the left side.   ASSESSMENT/PLAN [name] is a 73 y.o. male with history of diabetes mellitus and HTN but not compliant medications  presenting with right  sided weakness and facial droop. 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      MRI head -left paramedian pontine as well as left occipital infarct MRA head - not performed  CTA H&N - Interval improvement/clearing of previously seen small filling  defect in the proximal-mid basilar artery. 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 No significant intraluminal thrombus or raised dissection flap identified.  Carotid Doppler - CTA neck performed - carotid dopplers not indicated.  2D Echo -normal ejection fraction 60 to 65%.  Left atrium size is mildly dilated.  [employer] Virus 2 - negative at OSH.  LDL - 166  HgbA1c - 13.3  UDS - not performed  VTE prophylaxis - SCDs  Diet - per SLP  No antithrombotic prior to admission, now on 81mg  ASA  Patient will be counseled to be compliant with his antithrombotic medications  Ongoing aggressive stroke risk factor management  Therapy recommendations: Inpt Rehab  disposition: Inpt Rehab as soon as pre-cert is completed with [employer]  Hypertension  Stable . Long-term BP goal normotensive at this time  Hyperlipidemia  Lipid lowering medication PTA:  none  LDL 166, goal < 70  Patient has declined in the past and con't to refuse at time of d/c  Diabetes  HgbA1c 13.3, goal < 7.0  Uncontrolled with hyperglycemia  Other Stroke Risk Factors  Advanced age  Obesity, Body mass index is 29.64 kg/m., recommend weight loss, diet and exercise as appropriate   Non compliant with medications   Other Active Problems  Na - 134  Non compliant with medications   Hospital day # 3  Continue ongoing physical/occupational and Speech therapy.  Patient has agreed to starting insulin and hopefully will be compliant in the future with all his medications as well.  Hopefully transfer to inpatient rehab today.  Patient is refusing loop recorder.    Antony Contras, MD  To contact Stroke Continuity provider, please refer to [email or web address] After hours, contact General Neurology

## 2018-12-20 NOTE — Discharge Summary (Cosign Needed Addendum)
Physician Discharge Summary  Patient ID: Robert Brewer MRN: [id number] DOB/AGE: 1946-04-30 73 y.o.  Admit date: 12/17/2018 Discharge date: 12/20/2018  Admission Diagnoses: Ischemic Stroke  Discharge Diagnoses:  Active Problems:   Ischemic cerebrovascular accident (CVA) [facility])   Discharged Condition: stable  Hospital Course: [name] is a 73 y.o. male with history of diabetes mellitus and HTN but not compliant medications  presenting with right sided weakness and facial droop. Stroke wk up as below. Pt refused Loop monitor. 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 No significant intraluminal thrombus or raised dissection flap identified.  Carotid Doppler - CTA neck performed - carotid dopplers not indicated.  2D Echo -normal ejection fraction 60 to 65%.  Left atrium size is mildly dilated.  [employer] Virus 2 - negative at OSH.  LDL - 166  HgbA1c - 13.3  UDS - not performed  VTE prophylaxis - SCDs  Diet - per SLP  No antithrombotic prior to admission, now on 81mg  ASA  Patient will be counseled to be compliant with his antithrombotic medications  Ongoing aggressive stroke risk factor management  Therapy recommendations: Inpt Rehab  disposition: Inpt Rehab   Hypertension  Stable  Long-term BP goal normotensive  at this time  Hyperlipidemia  Lipid lowering medication PTA:  none  LDL 166, goal < 70  Patient has declined in the past and con't to refuse at time of d/c  Diabetes  HgbA1c 13.3, goal < 7.0  Uncontrolled with hyperglycemia  Other Stroke Risk Factors  Advanced age  Obesity, Body mass index is 29.64 kg/m., recommend weight loss, diet and exercise as appropriate   Non compliant with medications  Other Active Problems  Non compliant with medications; resistant to Western medicine   Consults: diabetes team  Significant Diagnostic Studies: MRI, CTA, Echo  Treatments: IV tPA  Discharge Exam: Blood pressure 119/84, pulse 60, temperature (!) 97.4 F (36.3 C), temperature source Oral, resp. rate 20, height 5\' 11"  (1.803 m), weight 96.4 kg, SpO2 96 %.  Physical exam: Pleasant elderly Caucasian male not in distress. . Afebrile. Head is nontraumatic. Neck is supple without bruit.    Cardiac exam no murmur or gallop. Lungs are clear to auscultation. Distal pulses are well felt. Neurological Exam: Awake alert oriented to time place and person.  Severe dysarthria but can be understood with some difficulty.  Extraocular movements are full range but with saccadic dysmetria on left gaze. Mild right lower facial weakness.  Tongue midline.  Weak cough and gag.  Right hemiplegia with right upper extremity 0/5 strength with hypotonia.  Right lower extremity 2/5 strength.  Normal strength in the left side.   Disposition:  Stable to Inpt Rehab  Allergies as of 12/20/2018   Not on File     Medication List    TAKE these medications    stroke: mapping our early stages of recovery  book Misc 1 each by Does not apply route once for 1 dose.   acetaminophen 325 MG tablet Commonly known as:  TYLENOL Take 2 tablets (650 mg total) by mouth every 4 (four) hours as needed for mild pain (or temp > 37.5 C (99.5 F)).   aspirin 81 MG EC tablet Take 1 tablet (81 mg total) by mouth  daily. Start taking on:  Dec 21, 2018   insulin aspart 100 UNIT/ML injection Commonly known as:  novoLOG Inject 0-9 Units into the skin 3 (three) times daily with meals.   insulin glargine 100 UNIT/ML injection Commonly known as:  LANTUS Inject 0.15 mLs (15 Units total) into the skin daily. Start taking on:  Dec 21, 2018   pantoprazole 40 MG tablet Commonly known as:  PROTONIX Take 1 tablet (40 mg total) by mouth at bedtime.   senna-docusate 8.6-50 MG tablet Commonly known as:  Senokot-S Take 1 tablet by mouth at bedtime as needed for mild constipation.      Follow-up [place] Follow up on 01/08/2019.   Why:  Your appt time is 12:00 pm. Please arrive early and bring picture ID, insurance card and current medications. Contact information: [place] [address] [phone]        Garvin Fila, MD Follow up in 6 week(s).   Specialties:  Neurology, Radiology Contact information: [address] [place] [address] [phone]          51min time spent  Signed: Elmyra Ricks 12/20/2018, 2:06 PM

## 2018-12-20 NOTE — Progress Notes (Signed)
Inpatient Diabetes Program Recommendations  AACE/ADA: New Consensus Statement on Inpatient Glycemic Control   Target Ranges:  Prepandial:   less than 140 mg/dL      Peak postprandial:   less than 180 mg/dL (1-2 hours)      Critically ill patients:  140 - 180 mg/dL  Results for RITHWIK, SCHMIEG (MRN [id number]) as of 12/20/2018 10:05  Ref. Range 12/19/2018 06:06 12/19/2018 12:24 12/19/2018 16:02 12/20/2018 06:48  Glucose-Capillary Latest Ref Range: 70 - 99 mg/dL 250 (H) 321 (H) 324 (H) 236 (H)   Results for CORNELIUS, MARULLO (MRN [id number]) as of 12/19/2018 11:11  Ref. Range 12/18/2018 06:00 12/18/2018 12:54 12/18/2018 16:27 12/18/2018 21:34  Glucose-Capillary Latest Ref Range: 70 - 99 mg/dL 231 (H) 314 (H) 381 (H) 239 (H)  Results for NIAM, NEPOMUCENO (MRN [id number]) as of 12/20/2018 10:05  Ref. Range 12/17/2018 02:12  Hemoglobin A1C Latest Ref Range: 4.8 - 5.6 % 13.3 (H)   Review of Glycemic Control  Diabetes history: DM2 Outpatient Diabetes medications: None Current orders for Inpatient glycemic control: Lantus 15 units daily, Novolog 0-9 units TID with meals  Inpatient Diabetes Program Recommendations:   Insulin - Basal: Please consider increasing Lantus to 20 units daily (to start now).  Correction (SSI): Please consider ordering Novolog 0-5 units QHS for bedtime correction.  Insulin-Meal Coverage: Please consider ordering Novolog 4 units TID with meals for meal coverage.  HbgA1C: A1C 13.3% on 12/17/18 indicating an average glucose of 335 mg/dl over the past 2/3 months. Patient will need insulin for outpatient DM control.  Thanks, Barnie Alderman, RN, MSN, CDE Diabetes Coordinator Inpatient Diabetes Program [phone] (Team Pager from 8am to 5pm)

## 2018-12-20 NOTE — Care Management Important Message (Signed)
Important Message  Patient Details  Name: Robert Brewer MRN: [id number] Date of Birth: [birth year]   Medicare Important Message Given:  Yes    [name] 12/20/2018, 3:03 PM

## 2018-12-21 DIAGNOSIS — I10 Essential (primary) hypertension: Secondary | ICD-10-CM | POA: Diagnosis present

## 2018-12-21 DIAGNOSIS — E785 Hyperlipidemia, unspecified: Secondary | ICD-10-CM | POA: Diagnosis present

## 2018-12-21 DIAGNOSIS — Z5329 Procedure and treatment not carried out because of patient's decision for other reasons: Secondary | ICD-10-CM

## 2018-12-21 DIAGNOSIS — Z91199 Patient's noncompliance with other medical treatment and regimen due to unspecified reason: Secondary | ICD-10-CM

## 2018-12-21 DIAGNOSIS — R1314 Dysphagia, pharyngoesophageal phase: Secondary | ICD-10-CM | POA: Diagnosis present

## 2018-12-21 DIAGNOSIS — Z9114 Patient's other noncompliance with medication regimen: Secondary | ICD-10-CM

## 2018-12-21 DIAGNOSIS — R471 Dysarthria and anarthria: Secondary | ICD-10-CM | POA: Diagnosis present

## 2018-12-21 DIAGNOSIS — Z9282 Status post administration of tPA (rtPA) in a different facility within the last 24 hours prior to admission to current facility: Secondary | ICD-10-CM

## 2018-12-21 DIAGNOSIS — I6330 Cerebral infarction due to thrombosis of unspecified cerebral artery: Secondary | ICD-10-CM | POA: Diagnosis present

## 2018-12-21 DIAGNOSIS — G8191 Hemiplegia, unspecified affecting right dominant side: Secondary | ICD-10-CM

## 2018-12-21 LAB — GLUCOSE, CAPILLARY
Glucose-Capillary: 260 mg/dL — ABNORMAL HIGH (ref 70–99)
Glucose-Capillary: 279 mg/dL — ABNORMAL HIGH (ref 70–99)
Glucose-Capillary: 237 mg/dL — ABNORMAL HIGH (ref 70–99)
Glucose-Capillary: 248 mg/dL — ABNORMAL HIGH (ref 70–99)

## 2018-12-21 MED ORDER — INSULIN ASPART 100 UNIT/ML ~~LOC~~ SOLN
0.0000 [IU] | Freq: Every day | SUBCUTANEOUS | Status: DC
  Administered 2018-12-21 – 2018-12-22 (×2): 2 [IU] via SUBCUTANEOUS

## 2018-12-21 MED ORDER — INSULIN GLARGINE 100 UNIT/ML ~~LOC~~ SOLN
5.0000 [IU] | Freq: Once | SUBCUTANEOUS | Status: AC
  Administered 2018-12-21: 5 [IU] via SUBCUTANEOUS
  Filled 2018-12-21: qty 0.05

## 2018-12-21 MED ORDER — INSULIN ASPART 100 UNIT/ML ~~LOC~~ SOLN
4.0000 [IU] | Freq: Three times a day (TID) | SUBCUTANEOUS | Status: DC
  Administered 2018-12-21 – 2018-12-22 (×4): 4 [IU] via SUBCUTANEOUS

## 2018-12-21 MED ORDER — POLYETHYLENE GLYCOL 3350 17 G PO PACK
17.0000 g | PACK | Freq: Every day | ORAL | Status: DC | PRN
  Administered 2018-12-21 – 2018-12-23 (×2): 17 g via ORAL
  Filled 2018-12-21 (×2): qty 1

## 2018-12-21 MED ORDER — INSULIN GLARGINE 100 UNIT/ML ~~LOC~~ SOLN
20.0000 [IU] | Freq: Every day | SUBCUTANEOUS | Status: DC
  Administered 2018-12-22: 20 [IU] via SUBCUTANEOUS
  Filled 2018-12-21: qty 0.2

## 2018-12-21 MED ORDER — INSULIN ASPART 100 UNIT/ML ~~LOC~~ SOLN
0.0000 [IU] | Freq: Three times a day (TID) | SUBCUTANEOUS | Status: DC
  Administered 2018-12-21: 5 [IU] via SUBCUTANEOUS
  Administered 2018-12-21 – 2018-12-22 (×2): 3 [IU] via SUBCUTANEOUS
  Administered 2018-12-22 – 2018-12-23 (×3): 5 [IU] via SUBCUTANEOUS
  Administered 2018-12-23 – 2018-12-24 (×3): 3 [IU] via SUBCUTANEOUS
  Administered 2018-12-24: 17:00:00 2 [IU] via SUBCUTANEOUS
  Administered 2018-12-24: 3 [IU] via SUBCUTANEOUS

## 2018-12-21 NOTE — Progress Notes (Signed)
Inpatient Diabetes Program Recommendations  AACE/ADA: New Consensus Statement on Inpatient Glycemic Control   Target Ranges:  Prepandial:   less than 140 mg/dL      Peak postprandial:   less than 180 mg/dL (1-2 hours)      Critically ill patients:  140 - 180 mg/dL  Results for Robert Brewer, Robert Brewer (MRN [id number]) as of 12/21/2018 09:45  Ref. Range 12/20/2018 06:48 12/20/2018 12:04 12/20/2018 15:29 12/20/2018 20:45 12/21/2018 06:16  Glucose-Capillary Latest Ref Range: 70 - 99 mg/dL 236 (H) 340 (H) 290 (H) 278 (H) 260 (H)   Results for Robert Brewer, Robert Brewer (MRN [id number]) as of 12/20/2018 10:05  Ref. Range 12/19/2018 06:06 12/19/2018 12:24 12/19/2018 16:02  Glucose-Capillary Latest Ref Range: 70 - 99 mg/dL 250 (H) 321 (H) 324 (H)   Results for Robert Brewer, Robert Brewer (MRN [id number]) as of 12/19/2018 11:11  Ref. Range 12/18/2018 06:00 12/18/2018 12:54 12/18/2018 16:27 12/18/2018 21:34  Glucose-Capillary Latest Ref Range: 70 - 99 mg/dL 231 (H) 314 (H) 381 (H) 239 (H)  Results for Robert Brewer, Robert Brewer (MRN [id number]) as of 12/20/2018 10:05  Ref. Range 12/17/2018 02:12  Hemoglobin A1C Latest Ref Range: 4.8 - 5.6 % 13.3 (H)   Review of Glycemic Control  Diabetes history: DM2 Outpatient Diabetes medications: None Current orders for Inpatient glycemic control: Lantus 15 units daily, Novolog 0-9 units TID with meals  Inpatient Diabetes Program Recommendations:   Insulin - Basal: Please consider increasing Lantus to 20 units daily (to start now).  Correction (SSI): Please consider ordering Novolog 0-5 units QHS for bedtime correction.  Insulin-Meal Coverage: Please consider ordering Novolog 4 units TID with meals for meal coverage.  HbgA1C: A1C 13.3% on 12/17/18 indicating an average glucose of 335 mg/dl over the past 2/3 months. Patient will need insulin for outpatient DM control.  Thanks, Barnie Alderman, RN, MSN, CDE Diabetes Coordinator Inpatient Diabetes Program [phone] (Team Pager from 8am to 5pm)

## 2018-12-21 NOTE — Progress Notes (Addendum)
STROKE TEAM PROGRESS NOTE      SUBJECTIVE (INTERVAL HISTORY) Stable and ready for d/c to Inpt rehab. Neuro exam stable, no change. He voices frustration with insurance delay in d/c to [facility].  OBJECTIVE Vitals:   12/20/18 1935 12/21/18 0009 12/21/18 0340 12/21/18 0722  BP: 129/71 (!) 111/53 131/70 114/68  Pulse: (!) 57 (!) 54 (!) 56 (!) 45  Resp: 18 19 17 16   Temp: 98 F (36.7 C) 97.9 F (36.6 C) 98.4 F (36.9 C) 98.3 F (36.8 C)  TempSrc: Oral Oral Oral Oral  SpO2: 96% 93% 94% 95%  Weight:      Height:        CBC:  Recent Labs  Lab 12/17/18 0448  HGB 13.6  HCT 34.7    Basic Metabolic Panel:  Recent Labs  Lab 12/17/18 0448 12/17/18 0454  NA 134* 134*  K 4.3 3.9  CL 99 97*  CO2  --  24  GLUCOSE 394* 383*  BUN 21 19  CREATININE 0.70 0.79  CALCIUM  --  8.9    Lipid Panel:     Component Value Date/Time   CHOL 227 (H) 12/17/2018 0212   TRIG 81 12/17/2018 0212   HDL 45 12/17/2018 0212   CHOLHDL 5.0 12/17/2018 0212   VLDL 16 12/17/2018 0212   LDLCALC 166 (H) 12/17/2018 0212   HgbA1c:  Lab Results  Component Value Date   HGBA1C 13.3 (H) 12/17/2018   Urine Drug Screen: No results found for: LABOPIA, COCAINSCRNUR, LABBENZ, AMPHETMU, THCU, LABBARB  Alcohol Level No results found for: ETH  IMAGING  MRI Wo Contrast -acute infarct involving left paramedian pons, left occipital lobe    Ct Angio Head W Or Wo Contrast 12/17/2018 IMPRESSION:   CT HEAD 1. Subtle approximate 1 cm hypodensity involving the left paramedian pons, increased in prominence from previous, and suspicious for evolving acute ischemic infarct.  2. Otherwise stable appearance of the head with associated age-related cerebral atrophy. No other acute abnormality identified.   CTA HEAD AND NECK Ct Angio Neck W Or Wo Contrast 1. 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 No significant intraluminal thrombus or raised dissection flap identified.  2. No large vessel occlusion. No other hemodynamically significant or correctable stenosis.   CXR - normal at OSH   Transthoracic Echocardiogram  Normal ejection fraction of 60 to 65%.  Mild thickening of aortic valve with calcification  EKG - SB at OSH   PHYSICAL EXAM Blood pressure 114/68, pulse (!) 45, temperature 98.3 F (36.8 C), temperature source Oral, resp. rate 16, height 5\' 11"  (1.803 m), weight 96.4 kg, SpO2 95 %.   Physical exam: Pleasant elderly Caucasian male not in distress. . Afebrile. Head is nontraumatic. Neck is supple without bruit.    Cardiac exam no murmur or gallop. Lungs are clear to auscultation. Distal pulses are well felt. Neurological Exam: Awake alert oriented to time place and person.  Severe dysarthria but can be understood with some difficulty.  Extraocular movements are full range but with saccadic dysmetria on left gaze. Mild right lower facial weakness.  Tongue midline.  Weak cough and gag.  Right hemiplegia with right upper extremity 0/5 strength with hypotonia.  Right lower extremity 2/5 strength.  Normal strength in the left side.   ASSESSMENT/PLAN [name] is a 73 y.o. male with history of diabetes mellitus and HTN but not compliant medications  presenting with right sided weakness and facial droop. 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 resultant  Right hemiparesis and dysarthria  CT head - Subtle approximate 1 cm hypodensity involving the left paramedian pons, increased in prominence from previous, and suspicious for evolving acute ischemic infarct.      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 No significant intraluminal thrombus or raised dissection flap identified.  Carotid Doppler - CTA neck performed - carotid dopplers not indicated.  2D Echo -normal ejection fraction 60 to 65%.  Left atrium size is mildly dilated.  [employer] Virus 2 - negative at OSH.  LDL - 166  HgbA1c - 13.3  UDS - not performed  VTE prophylaxis - SCDs  Diet - per SLP  No antithrombotic prior to admission, now on 81mg  ASA  Patient will be counseled to be compliant with his antithrombotic medications  Ongoing aggressive stroke risk factor management  Therapy recommendations: Inpt Rehab  disposition: Inpt Rehab as soon as pre-cert is completed with [employer]  Hypertension  Stable . Long-term BP goal normotensive at this time  Hyperlipidemia  Lipid lowering medication PTA:  none  LDL 166, goal < 70  Patient has declined in the past and con't to refuse at time of d/c  Diabetes  HgbA1c 13.3, goal < 7.0  Uncontrolled with hyperglycemia  He is resistant to using insulin and needs a lot of ongoing education.   Other Stroke Risk Factors  Advanced age  Obesity, Body mass index is 29.64 kg/m., recommend weight loss, diet and exercise as appropriate   Non compliant with medications   Other Active Problems  Non compliant with medications; resistant to [facility] day # 4  Continue ongoing physical/occupational and Speech therapy in intensive rehab setting.  Patient has agreed to starting insulin and hopefully will be compliant in the future with all his medications as well.  Hopefully transfer to inpatient rehab today.  Patient is refusing loop recorder. Attempted to educate pt on his stroke risks and ongoing secondary prevention. He remains resistant to [place] medicine.   Desiree Metzger-Cihelka, ARNP-C, ANVP-BC Pager:  [phone] I have personally obtained history,examined this patient, reviewed notes, independently viewed imaging studies, participated in medical decision making and plan of care.ROS completed by me personally and pertinent positives fully documented  I have made any additions or clarifications directly to the above note. Agree with note above.   Antony Contras, MD Medical Director [place] Pager: [phone] 12/21/2018 3:59 PM  To contact Stroke Continuity provider, please refer to [email or web address] After hours, contact General Neurology

## 2018-12-21 NOTE — Progress Notes (Signed)
Physical Therapy Treatment Patient Details Name: Robert Brewer MRN: [id number] DOB: 10-13-1945 Today's Date: 12/21/2018    History of Present Illness Pt is a 73 y.o. male with past medical history of diabetes mellitus, HTN not compliant with medications who presented to the emergency department with right-sided weakness and facial droop. CT of the head revealed subtle approximate 1 cm hypodensity involving the left paramedian pons, increased in prominence from previous, and suspicious for evolving acute ischemic infarct. IV tPA was administered and he presented with severe dysarthria when he was seen by neurology.      PT Comments    Patient remains highly motivated. He is setting goals for himself. He had some volentary thumb movement in the beginning of therapy but at the end of the session he was unable to use his thumb. He had increased stride length with his right but at times that put him out of position to advance his left. He also had buckling and hyper extension on the right at times. Hopefully he progresses but a knee brace may be beneficial in the future if it does not. The patient is a great candidate for inpatient rehab.   Follow Up Recommendations  CIR     Equipment Recommendations  Rolling walker with 5" wheels;3in1 (PT)    Recommendations for Other Services Rehab consult     Precautions / Restrictions Precautions Precautions: Fall Precaution Comments: right hemiparesis, double vision has cleared Restrictions Weight Bearing Restrictions: No    Mobility  Bed Mobility Overal bed mobility: Needs Assistance Bed Mobility: Supine to Sit     Supine to sit: HOB elevated;Mod assist     General bed mobility comments: Mod a to scoot to the edge of the bed. Mod a to assist right arm but did better today lifting it.   Transfers Overall transfer level: Needs assistance Equipment used: Hemi-walker Transfers: Stand Pivot Transfers           General transfer comment:  sit to stand 3x with therapy. Mod aeach time with cuing to set his  correctly.   Ambulation/Gait Ambulation/Gait assistance: Mod assist Gait Distance (Feet): 10 Feet(5'x2) Assistive device: Hemi-walker Gait Pattern/deviations: Decreased weight shift to left;Ataxic;Narrow base of support;Decreased stride length Gait velocity: decreased    General Gait Details: The patient required less assistance to find midline with gait today. He was able to move his right leg further which actually caused him to require a bit more assist. He is having trouble with hyper extension and buckling of his right leg. When he moves it too far out he has difficulty moving his left leg up to his right.    Stairs             Wheelchair Mobility    Modified Rankin (Stroke Patients Only) Modified Rankin (Stroke Patients Only) Pre-Morbid Rankin Score: No symptoms Modified Rankin: Severe disability     Balance Overall balance assessment: Needs assistance Sitting-balance support: Single extremity supported;Feet supported Sitting balance-Leahy Scale: Good Sitting balance - Comments: No aassistance required to remain sitting today   Standing balance support: Single extremity supported Standing balance-Leahy Scale: Poor Standing balance comment: improved ability to find center. Less pushing to the right                             Cognition Arousal/Alertness: Awake/alert Behavior During Therapy: WFL for tasks assessed/performed Overall Cognitive Status: Within Functional Limits for tasks assessed  Exercises General Exercises - Lower Extremity Long Arc Quad: 20 reps;Both Hip Flexion/Marching: 20 reps;Both    General Comments        Pertinent Vitals/Pain Pain Assessment: No/denies pain    Home Living                      Prior Function            PT Goals (current goals can now be found in the care plan section)  Acute Rehab PT Goals PT Goal Formulation: With patient Time For Goal Achievement: 12/25/18 Potential to Achieve Goals: Good Progress towards PT goals: Progressing toward goals    Frequency    Min 4X/week      PT Plan Current plan remains appropriate    Co-evaluation              AM-PAC PT "6 Clicks" Mobility   Outcome Measure  Help needed turning from your back to your side while in a flat bed without using bedrails?: A Lot Help needed moving from lying on your back to sitting on the side of a flat bed without using bedrails?: A Lot Help needed moving to and from a bed to a chair (including a wheelchair)?: A Lot Help needed standing up from a chair using your arms (e.g., wheelchair or bedside chair)?: A Lot Help needed to walk in hospital room?: Total Help needed climbing 3-5 steps with a railing? : Total 6 Click Score: 10    End of Session Equipment Utilized During Treatment: Gait belt       PT Visit Diagnosis: Unsteadiness on feet (R26.81);Other abnormalities of gait and mobility (R26.89);Muscle weakness (generalized) (M62.81)     Time: 1448-1856 PT Time Calculation (min) (ACUTE ONLY): 32 min  Charges:  $Gait Training: 8-22 mins $Therapeutic Activity: 8-22 mins                        Carney Living PT DPT  12/21/2018, 10:58 AM

## 2018-12-21 NOTE — Progress Notes (Signed)
Inpatient Rehabilitation-Admissions Coordinator   Still no determination made by insurance with regards to CIR. Case still in review. Will follow up Monday.   Jhonnie Garner, OTR/L  Rehab Admissions Coordinator  [phone] 12/21/2018 4:53 PM

## 2018-12-21 NOTE — H&P (Signed)
Physical Medicine and Rehabilitation Admission H&P    Chief complaint: Right side weakness HPI: Robert Brewer is a 73 year old right-handed male with history of diabetes mellitus, hypertension and medical noncompliance.  Patient on no prescription medications.  Per chart review lives with spouse.  Independent prior to admission.  2 level home.  Presented to [facility] 12/17/2018 with acute onset of right side weakness, dysarthria and facial droop while mowing the grass.  CT of the head at outside hospital showed no hemorrhage.  Patient did receive TPA and was transferred to [facility].  CT angiogram of head and neck showed 1 cm hypodensity involving the left paramedian pons and suspicious for evolving acute ischemic infarct.  CTA head and neck with no large vessel occlusion no significant intraluminal thrombus or dissection identified.  MRI confirms acute infarct left paramedian pons small acute cortical infarct left occipital lobe.  Negative for hemorrhage.  Currently maintained on aspirin for CVA prophylaxis.  Echocardiogram with ejection fraction of 68% normal systolic function.  Negative bubble study.  Recommendations had been made for loop recorder placement and patient refused.  Hemoglobin A1c 13.3 with insulin therapy as directed.  Tolerating a regular consistency diet.  Therapy evaluations completed and patient was admitted for a comprehensive rehab program  Review of Systems  Constitutional: Negative for chills and fever.  HENT: Negative for hearing loss.   Eyes: Negative for blurred vision and double vision.  Respiratory: Negative for shortness of breath.   Cardiovascular: Negative for palpitations and leg swelling.  Gastrointestinal: Positive for blood in stool. Negative for heartburn and nausea.  Genitourinary: Negative for dysuria, flank pain and hematuria.  Musculoskeletal: Positive for joint pain and myalgias.  Skin: Negative for rash.  Neurological: Positive for  speech change and weakness.       Occasional headache  All other systems reviewed and are negative.  History reviewed. No pertinent past medical history. History reviewed. No pertinent surgical history. Family History  Problem Relation Age of Onset  . Other Mother        healthy, lived to her 30's  . Prostate cancer Father        lived to his 41's  . Congestive Heart Failure Father   . Valvular heart disease Sister   . Prostate cancer Brother    Social History:  has no history on file for tobacco, alcohol, and drug. Allergies: Not on File No medications prior to admission.    Drug Regimen Review Drug regimen was reviewed and remains appropriate with no significant issues identified  Home: Home Living Family/patient expects to be discharged to:: Private residence Living Arrangements: Spouse/significant other Available Help at Discharge: Family, Available 24 hours/day Type of Home: House Home Access: Stairs to enter [employer] of Steps: 1 step to back porch no railing Home Layout: Two level Bathroom Shower/Tub: Multimedia programmer: Standard Bathroom Accessibility: Yes Home Equipment: Radio producer - single point  Lives With: Spouse   Functional History: Prior Function Level of Independence: Independent Comments: Patient reports he has a cane at home that his wife used sometimes but he dosent. He sleeps in a recliner but only because he stays up late watching TV.   Functional Status:  Mobility: Bed Mobility Overal bed mobility: Needs Assistance Bed Mobility: Supine to Sit Supine to sit: Min assist General bed mobility comments: min A with head of bed elevated, assist to scoot to edge of bed for Rt hip Transfers Overall transfer level: Needs assistance Equipment used:  Hemi-walker Transfers: Sit to/from Stand, [employer] Transfers Sit to Stand: Mod assist Stand pivot transfers: Mod assist General transfer comment: sit <> stand  3 x 5 without UE  assist with focus on midline orientation and Rt LE strengthening with mod A Ambulation/Gait Ambulation/Gait assistance: Mod assist Gait Distance (Feet): 6 Feet Assistive device: Hemi-walker Gait Pattern/deviations: Decreased weight shift to left, Ataxic, Narrow base of support, Decreased stride length General Gait Details: pt able to advance Rt LE with min facilitation, manual facilitation for motor planning, wt shifts and coordination Gait velocity: decreased  Gait velocity interpretation: <1.31 ft/sec, indicative of household ambulator Stairs: Yes    ADL: ADL Overall ADL's : Needs assistance/impaired Eating/Feeding: Set up Eating/Feeding Details (indicate cue type and reason): sitting supported in chair. If sitting EOB would need close supervision to occassional min a for balance.  Grooming: Wash/dry hands, Wash/dry face, Oral care, Minimal assistance, Set up, Sitting Grooming Details (indicate cue type and reason): in supported chair Upper Body Bathing: Minimal assistance, Sitting Upper Body Bathing Details (indicate cue type and reason): Assistance to pick up right UE.  Lower Body Bathing: Maximal assistance, Sit to/from stand Upper Body Dressing : Moderate assistance, Sitting Lower Body Dressing: Maximal assistance, Sit to/from stand Toilet Transfer: Maximal assistance, Cueing for sequencing, Cueing for safety Toilet Transfer Details (indicate cue type and reason): Simulated to recliner with PT completing. Transferred from bed to recliner going towards the left. See PT note. Toileting- Clothing Manipulation and Hygiene: Maximal assistance Functional mobility during ADLs: +2 for physical assistance, Moderate assistance General ADL Comments: R bias with decreased RLE control. Pt able to weight shift to midline and then to L with cues but unable to maintain.  Pt aware he was losing balance to the left however no attempts to remediate.   Cognition: Cognition Overall Cognitive Status:  Within Functional Limits for tasks assessed Arousal/Alertness: Awake/alert Orientation Level: Oriented X4 Attention: Focused, Sustained Focused Attention: Appears intact Sustained Attention: Appears intact Memory: Appears intact(Immediate: 3/3; Delayed: 3/3) Awareness: Appears intact Problem Solving: Appears intact(5/5) Executive Function: Sequencing Sequencing: Appears intact Cognition Arousal/Alertness: Awake/alert Behavior During Therapy: WFL for tasks assessed/performed Overall Cognitive Status: Within Functional Limits for tasks assessed General Comments: Patient les s emotioanl today  Physical Exam: Blood pressure 130/79, pulse (!) 57, temperature 98.4 F (36.9 C), temperature source Oral, resp. rate 16, height 5\' 11"  (1.803 m), weight 96.4 kg, SpO2 95 %. Physical Exam  Constitutional: He appears well-developed. No distress.  HENT:  Head: Normocephalic and atraumatic.  Eyes: Pupils are equal, round, and reactive to light. EOM are normal.  Neck: Normal range of motion. No thyromegaly present.  Cardiovascular: Normal rate. Exam reveals no friction rub.  Respiratory: Effort normal. No respiratory distress.  GI: Soft. He exhibits no distension. There is no abdominal tenderness.  Neurological: He is alert.  Patient is alert.  Dysarthric but intelligible.  Follows commands.  Fair awareness of deficits.  Right central 7 and tongue deviation. RUE 0/5 prox to distal. RLE 3-/5 prox to tr-0/5 ADF/PF. LUE and LLE 4-5/5. No sensory deficits.     Results for orders placed or performed during the hospital encounter of 12/17/18 (from the past 48 hour(s))  Glucose, capillary     Status: Abnormal   Collection Time: 12/22/18  4:32 PM  Result Value Ref Range   Glucose-Capillary 289 (H) 70 - 99 mg/dL   Comment 1 Notify RN    Comment 2 Document in Chart   Glucose, capillary  Status: Abnormal   Collection Time: 12/22/18  8:57 PM  Result Value Ref Range   Glucose-Capillary 235 (H) 70 - 99  mg/dL  Glucose, capillary     Status: Abnormal   Collection Time: 12/23/18  6:07 AM  Result Value Ref Range   Glucose-Capillary 222 (H) 70 - 99 mg/dL   Comment 1 Notify RN    Comment 2 Document in Chart   Glucose, capillary     Status: Abnormal   Collection Time: 12/23/18  8:05 AM  Result Value Ref Range   Glucose-Capillary 224 (H) 70 - 99 mg/dL  Glucose, capillary     Status: Abnormal   Collection Time: 12/23/18 11:10 AM  Result Value Ref Range   Glucose-Capillary 247 (H) 70 - 99 mg/dL   Comment 1 Notify RN    Comment 2 Document in Chart   Glucose, capillary     Status: Abnormal   Collection Time: 12/23/18  4:10 PM  Result Value Ref Range   Glucose-Capillary 274 (H) 70 - 99 mg/dL   Comment 1 Notify RN    Comment 2 Document in Chart   Glucose, capillary     Status: Abnormal   Collection Time: 12/23/18  9:26 PM  Result Value Ref Range   Glucose-Capillary 195 (H) 70 - 99 mg/dL   Comment 1 Notify RN    Comment 2 Document in Chart   Glucose, capillary     Status: Abnormal   Collection Time: 12/24/18  6:15 AM  Result Value Ref Range   Glucose-Capillary 232 (H) 70 - 99 mg/dL   Comment 1 Notify RN    Comment 2 Document in Chart   Glucose, capillary     Status: Abnormal   Collection Time: 12/24/18 11:48 AM  Result Value Ref Range   Glucose-Capillary 228 (H) 70 - 99 mg/dL   Comment 1 Notify RN    Comment 2 Document in Chart    No results found.     Medical Problem List and Plan: 1.  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  Status post TPA.  Patient refused loop recorder  -admit to inpatient rehab 2.  Antithrombotics: -DVT/anticoagulation: SCDs  -antiplatelet therapy: Aspirin 81 mg daily 3. Pain Management: Tylenol as needed 4. Mood: Provide emotional support  -antipsychotic agents: N/A 5. Neuropsych: This patient is capable of making decisions on his own behalf. 6.  Skin/Wound Care: Routine skin checks 7. Fluids/Electrolytes/Nutrition: Routine in and outs with follow-up chemistries  -encourage PO 8.  Diabetes mellitus.  Hemoglobin A1c 13.3. Novolog 7 units TID, Lantus insulin 30 units daily.    -check cbg's ac/hs,   -diabetic  9.  Permissive Hypertension.  Monitor with increased mobility.  Patient on no prescription medications prior to admission 10.  Medical noncompliance.  Counseling     Cathlyn Parsons, PA-C 12/24/2018

## 2018-12-22 ENCOUNTER — Inpatient Hospital Stay (HOSPITAL_COMMUNITY): Payer: Medicare HMO | Admitting: Physical Therapy

## 2018-12-22 ENCOUNTER — Inpatient Hospital Stay (HOSPITAL_COMMUNITY): Payer: Medicare HMO | Admitting: Speech Pathology

## 2018-12-22 ENCOUNTER — Inpatient Hospital Stay (HOSPITAL_COMMUNITY): Payer: Medicare HMO | Admitting: Occupational Therapy

## 2018-12-22 DIAGNOSIS — G8191 Hemiplegia, unspecified affecting right dominant side: Secondary | ICD-10-CM

## 2018-12-22 DIAGNOSIS — E785 Hyperlipidemia, unspecified: Secondary | ICD-10-CM

## 2018-12-22 DIAGNOSIS — R1314 Dysphagia, pharyngoesophageal phase: Secondary | ICD-10-CM

## 2018-12-22 DIAGNOSIS — Z9282 Status post administration of tPA (rtPA) in a different facility within the last 24 hours prior to admission to current facility: Secondary | ICD-10-CM

## 2018-12-22 DIAGNOSIS — I6312 Cerebral infarction due to embolism of basilar artery: Principal | ICD-10-CM

## 2018-12-22 DIAGNOSIS — R739 Hyperglycemia, unspecified: Secondary | ICD-10-CM

## 2018-12-22 DIAGNOSIS — E1165 Type 2 diabetes mellitus with hyperglycemia: Secondary | ICD-10-CM

## 2018-12-22 DIAGNOSIS — I10 Essential (primary) hypertension: Secondary | ICD-10-CM

## 2018-12-22 DIAGNOSIS — I639 Cerebral infarction, unspecified: Secondary | ICD-10-CM

## 2018-12-22 DIAGNOSIS — Z9114 Patient's other noncompliance with medication regimen: Secondary | ICD-10-CM

## 2018-12-22 DIAGNOSIS — R471 Dysarthria and anarthria: Secondary | ICD-10-CM

## 2018-12-22 LAB — GLUCOSE, CAPILLARY
Glucose-Capillary: 232 mg/dL — ABNORMAL HIGH (ref 70–99)
Glucose-Capillary: 273 mg/dL — ABNORMAL HIGH (ref 70–99)
Glucose-Capillary: 289 mg/dL — ABNORMAL HIGH (ref 70–99)
Glucose-Capillary: 235 mg/dL — ABNORMAL HIGH (ref 70–99)

## 2018-12-22 MED ORDER — INSULIN GLARGINE 100 UNIT/ML ~~LOC~~ SOLN
25.0000 [IU] | Freq: Every day | SUBCUTANEOUS | Status: DC
  Administered 2018-12-23: 25 [IU] via SUBCUTANEOUS
  Filled 2018-12-22: qty 0.25

## 2018-12-22 MED ORDER — INSULIN ASPART 100 UNIT/ML ~~LOC~~ SOLN
5.0000 [IU] | Freq: Three times a day (TID) | SUBCUTANEOUS | Status: DC
  Administered 2018-12-22 – 2018-12-23 (×3): 5 [IU] via SUBCUTANEOUS

## 2018-12-22 MED ORDER — SENNOSIDES-DOCUSATE SODIUM 8.6-50 MG PO TABS
1.0000 | ORAL_TABLET | Freq: Two times a day (BID) | ORAL | Status: DC
  Administered 2018-12-22 – 2018-12-24 (×4): 1 via ORAL
  Filled 2018-12-22 (×4): qty 1

## 2018-12-22 NOTE — Progress Notes (Signed)
STROKE TEAM PROGRESS NOTE      SUBJECTIVE (INTERVAL HISTORY) Sitting in chair, neuro stable, still has right hemiparesis, more pronounced at right upper extremity.  Pending CIR.   OBJECTIVE Vitals:   12/21/18 1927 12/22/18 0035 12/22/18 0445 12/22/18 0729  BP: 129/72 127/72 117/64 136/75  Pulse: (!) 57 (!) 50 (!) 45 (!) 52  Resp: 16 17 17 16   Temp: 98.2 F (36.8 C) 98 F (36.7 C) 97.8 F (36.6 C)   TempSrc: Oral     SpO2: 96% 94% 97% 94%  Weight:      Height:        CBC:  Recent Labs  Lab 12/17/18 0448  HGB 13.6  HCT 93.2    Basic Metabolic Panel:  Recent Labs  Lab 12/17/18 0448 12/17/18 0454  NA 134* 134*  K 4.3 3.9  CL 99 97*  CO2  --  24  GLUCOSE 394* 383*  BUN 21 19  CREATININE 0.70 0.79  CALCIUM  --  8.9    Lipid Panel:     Component Value Date/Time   CHOL 227 (H) 12/17/2018 0212   TRIG 81 12/17/2018 0212   HDL 45 12/17/2018 0212   CHOLHDL 5.0 12/17/2018 0212   VLDL 16 12/17/2018 0212   LDLCALC 166 (H) 12/17/2018 0212   HgbA1c:  Lab Results  Component Value Date   HGBA1C 13.3 (H) 12/17/2018   Urine Drug Screen: No results found for: LABOPIA, COCAINSCRNUR, LABBENZ, AMPHETMU, THCU, LABBARB  Alcohol Level No results found for: ETH  IMAGING  MRI Wo Contrast - acute infarct involving left paramedian pons, left occipital lobe.    Ct Angio Head W Or Wo Contrast 12/17/2018 IMPRESSION:   CT HEAD 1. Subtle approximate 1 cm hypodensity involving the left paramedian pons, increased in prominence from previous, and suspicious for evolving acute ischemic infarct.  2. Otherwise stable appearance of the head with associated age-related cerebral atrophy. No other acute abnormality identified.   CTA HEAD AND NECK Ct Angio Neck W Or Wo Contrast 1. 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 No significant intraluminal  thrombus or raised dissection flap identified.  2. No large vessel occlusion. No other hemodynamically significant or correctable stenosis.   CXR - normal at OSH   Transthoracic Echocardiogram  Normal ejection fraction of 60 to 65%.  Mild thickening of aortic valve with calcification  EKG - SB at OSH   PHYSICAL EXAM Blood pressure 136/75, pulse (!) 52, temperature 97.8 F (36.6 C), resp. rate 16, height 5\' 11"  (1.803 m), weight 96.4 kg, SpO2 94 %.   Physical exam: Pleasant elderly Caucasian male not in distress. . Afebrile. Head is nontraumatic. Neck is supple without bruit.    Cardiac exam no murmur or gallop. Lungs are clear to auscultation. Distal pulses are well felt. Neurological Exam: Awake alert oriented to time place and person.  Mild dysarthria with mild bradyphonia.  No aphasia. Extraocular movements are full range but with saccadic dysmetria on left gaze. Right lower facial weakness.  Tongue midline. Visual field full.  Right upper extremity 0/5 strength with hypotonia.  Right lower extremity 3-/5 strength proximal, 3/5 knee extension and 0/5 distal.  Normal strength in the left side. Sensation symmetrical, FTN on the left intact. Gait not tested.   ASSESSMENT/PLAN [name] is a 73 y.o. male with history of diabetes mellitus and HTN but not compliant medications  presenting with right sided weakness and facial droop. 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  Patient declined further cardioembolic work-up.  resultant  Right hemiparesis and dysarthria  CT head - Subtle approximate 1 cm hypodensity involving the left paramedian pons, suspicious for evolving acute ischemic infarct.   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  2D Echo - EF 60 to 65%.  Left atrium size is mildly dilated.  [employer] Virus 2 - negative at OSH.  LDL - 166  HgbA1c - 13.3  VTE prophylaxis - SCDs  Diet - per SLP  No antithrombotic prior to admission, now on 81mg  ASA. He has been resistant to [place] medicine. Will continue ASA 81 on discharge.  Ongoing aggressive stroke risk factor management  Therapy recommendations: CIR  disposition: pending  Hypertension  Stable . Long-term BP goal normotensive at this time  Hyperlipidemia  Lipid lowering medication PTA:  none  LDL 166, goal < 70  Patient has declined statin in the past and con't to refuse at time of d/c  Diabetes  HgbA1c 13.3, goal < 7.0  Uncontrolled with hyperglycemia  On lantus, increase to 25 U  On pre-meal novolog, increase to 5 U tid  SSI  CBG monitoring  DM coordinator on board, appreciate help  Close PCP follow-up after discharge  Other Stroke Risk Factors  Advanced age  Non compliant with medications  Other Active Problems  Non compliant with medications; resistant to [facility] day # 5  Robert Hawking, MD PhD Stroke Neurology 12/22/2018 4:41 PM    To contact Stroke Continuity provider, please refer to [email or web address] After hours, contact General Neurology

## 2018-12-22 NOTE — Progress Notes (Signed)
Physical Therapy Treatment Patient Details Name: Robert Brewer MRN: [id number] DOB: Mar 29, 1946 Today's Date: 12/22/2018    History of Present Illness Pt is a 73 y.o. male with past medical history of diabetes mellitus, HTN not compliant with medications who presented to the emergency department with right-sided weakness and facial droop. CT of the head revealed subtle approximate 1 cm hypodensity involving the left paramedian pons, increased in prominence from previous, and suspicious for evolving acute ischemic infarct. IV tPA was administered and he presented with severe dysarthria when he was seen by neurology.      PT Comments    Patient continues to be highly motivated and make progress. He was abel to transfer sit to stand 5x today. He demonstrated smoother transfers to the chair and improved gait technique. He was given some bands to work on for tomorrow. He was strongly advised not to get his arms sore though because he needs them to transfer.  He remains a strong candidate for CIR given his motivation level and high baseline level of function.   Follow Up Recommendations  CIR     Equipment Recommendations  Hemi walker ;3in1 (PT)    Recommendations for Other Services Rehab consult     Precautions / Restrictions Precautions Precautions: Fall Precaution Comments: right hemiparesis, double vision has cleared Restrictions Weight Bearing Restrictions: No    Mobility  Bed Mobility Overal bed mobility: Needs Assistance Bed Mobility: Supine to Sit     Supine to sit: HOB elevated;Mod assist     General bed mobility comments: Mod a to scoot to the edge of the bed. Mod a to assist right arm but did better today lifting it.   Transfers Overall transfer level: Needs assistance Equipment used: Hemi-walker Transfers: Stand Pivot Transfers           General transfer comment: Worked on sit to stand transfer from an elvated surface. Able to do 5x in a row. He required min a  by the last trial but did well with the first 4. After a seted rest break he transfered to a chair. He did better taking steps to turn today. He shorted his steps and had less buckling of his right leg  Ambulation/Gait Ambulation/Gait assistance: Mod assist Gait Distance (Feet): 6 Feet Assistive device: Hemi-walker Gait Pattern/deviations: Decreased weight shift to left;Ataxic;Narrow base of support;Decreased stride length     General Gait Details: Patient took shorte steps today and was able to walk with a smoother pattern today. He still is shifting hevily to the right but he is more self aware and able to correct.    Stairs             Wheelchair Mobility    Modified Rankin (Stroke Patients Only) Modified Rankin (Stroke Patients Only) Pre-Morbid Rankin Score: No symptoms Modified Rankin: Severe disability     Balance Overall balance assessment: Needs assistance Sitting-balance support: Single extremity supported;Feet supported Sitting balance-Leahy Scale: Good Sitting balance - Comments: No aassistance required to remain sitting today   Standing balance support: Single extremity supported Standing balance-Leahy Scale: Poor Standing balance comment: improved ability to find center. Less pushing to the right                             Cognition Arousal/Alertness: Awake/alert Behavior During Therapy: WFL for tasks assessed/performed Overall Cognitive Status: Within Functional Limits for tasks assessed  General Comments: Patient les s emotioanl today      Exercises General Exercises - Lower Extremity Long Arc Quad: 20 reps;Both Hip Flexion/Marching: 20 reps;Both Other Exercises Other Exercises: tied theraband to his bed Leg presses x20 each leg ( advised only to do with therapist so band dosent slip; Arm punches left ; tricpes ext left; arm abduction left ; hip abduction left all 15 reps with orange band.  Patient also advised not to over-do it. He needs his left arm to be strong and not sore.     General Comments        Pertinent Vitals/Pain Pain Assessment: No/denies pain    Home Living                      Prior Function            PT Goals (current goals can now be found in the care plan section) Acute Rehab PT Goals PT Goal Formulation: With patient Time For Goal Achievement: 12/25/18 Potential to Achieve Goals: Good Progress towards PT goals: Progressing toward goals    Frequency    Min 4X/week      PT Plan Current plan remains appropriate    Co-evaluation              AM-PAC PT "6 Clicks" Mobility   Outcome Measure  Help needed turning from your back to your side while in a flat bed without using bedrails?: A Lot Help needed moving from lying on your back to sitting on the side of a flat bed without using bedrails?: A Lot Help needed moving to and from a bed to a chair (including a wheelchair)?: A Lot Help needed standing up from a chair using your arms (e.g., wheelchair or bedside chair)?: A Lot Help needed to walk in hospital room?: Total Help needed climbing 3-5 steps with a railing? : Total 6 Click Score: 10    End of Session Equipment Utilized During Treatment: Gait belt       PT Visit Diagnosis: Unsteadiness on feet (R26.81);Other abnormalities of gait and mobility (R26.89);Muscle weakness (generalized) (M62.81)     Time: 0930-1005 PT Time Calculation (min) (ACUTE ONLY): 35 min  Charges:  $Gait Training: 8-22 mins $Therapeutic Exercise: 8-22 mins                       Carney Living PT DPT  12/22/2018, 11:51 AM

## 2018-12-23 DIAGNOSIS — K5900 Constipation, unspecified: Secondary | ICD-10-CM

## 2018-12-23 DIAGNOSIS — I6330 Cerebral infarction due to thrombosis of unspecified cerebral artery: Secondary | ICD-10-CM

## 2018-12-23 LAB — GLUCOSE, CAPILLARY
Glucose-Capillary: 222 mg/dL — ABNORMAL HIGH (ref 70–99)
Glucose-Capillary: 224 mg/dL — ABNORMAL HIGH (ref 70–99)
Glucose-Capillary: 247 mg/dL — ABNORMAL HIGH (ref 70–99)
Glucose-Capillary: 274 mg/dL — ABNORMAL HIGH (ref 70–99)
Glucose-Capillary: 195 mg/dL — ABNORMAL HIGH (ref 70–99)

## 2018-12-23 MED ORDER — INSULIN ASPART 100 UNIT/ML ~~LOC~~ SOLN
7.0000 [IU] | Freq: Three times a day (TID) | SUBCUTANEOUS | Status: DC
  Administered 2018-12-23 – 2018-12-24 (×4): 7 [IU] via SUBCUTANEOUS

## 2018-12-23 MED ORDER — INSULIN GLARGINE 100 UNIT/ML ~~LOC~~ SOLN
30.0000 [IU] | Freq: Every day | SUBCUTANEOUS | Status: DC
  Administered 2018-12-24: 09:00:00 30 [IU] via SUBCUTANEOUS
  Filled 2018-12-23: qty 0.3

## 2018-12-23 MED ORDER — BISACODYL 10 MG RE SUPP
10.0000 mg | Freq: Once | RECTAL | Status: AC
  Administered 2018-12-23: 10 mg via RECTAL
  Filled 2018-12-23: qty 1

## 2018-12-23 NOTE — Progress Notes (Signed)
Physical Therapy Treatment Patient Details Name: Robert Brewer MRN: [id number] DOB: 1946-04-01 Today's Date: 12/23/2018    History of Present Illness Pt is a 73 y.o. male with past medical history of diabetes mellitus, HTN not compliant with medications who presented to the emergency department with right-sided weakness and facial droop. CT of the head revealed subtle approximate 1 cm hypodensity involving the left paramedian pons, increased in prominence from previous, and suspicious for evolving acute ischemic infarct. IV tPA was administered and he presented with severe dysarthria when he was seen by neurology.      PT Comments    Session focused on sit to stand, transfers, standing balance, and therex. Pt mod A at this time to and from [facility]. Patient motivated to work with therapy, cont to rec CIR.     Follow Up Recommendations  CIR     Equipment Recommendations  Rolling walker with 5" wheels;3in1 (PT)    Recommendations for Other Services Rehab consult     Precautions / Restrictions Precautions Precautions: Fall Precaution Comments: right hemiparesis, double vision has cleared Restrictions Weight Bearing Restrictions: No    Mobility  Bed Mobility Overal bed mobility: Needs Assistance Bed Mobility: Supine to Sit     Supine to sit: HOB elevated;Mod assist     General bed mobility comments: Mod a to scoot to the edge of the bed. Mod a to assist right arm but did better today lifting it.   Transfers Overall transfer level: Needs assistance Equipment used: Hemi-walker Transfers: Sit to/from [facility] Sit to Stand: Mod assist Stand pivot transfers: Mod assist       General transfer comment: sit to stand x5 blocking R knee, SPT from [facility] with mod to max A, use of hemi walker  Ambulation/Gait                 Stairs             Wheelchair Mobility    Modified Rankin (Stroke Patients Only) Modified Rankin (Stroke Patients  Only) Pre-Morbid Rankin Score: No symptoms Modified Rankin: Severe disability     Balance Overall balance assessment: Needs assistance Sitting-balance support: Single extremity supported;Feet supported Sitting balance-Leahy Scale: Good Sitting balance - Comments: No aassistance required to remain sitting today   Standing balance support: Single extremity supported Standing balance-Leahy Scale: Poor Standing balance comment: improved ability to find center. Less pushing to the right                             Cognition Arousal/Alertness: Awake/alert Behavior During Therapy: WFL for tasks assessed/performed Overall Cognitive Status: Within Functional Limits for tasks assessed                                 General Comments: Patient les s emotioanl today      Exercises General Exercises - Lower Extremity Ankle Circles/Pumps: 20 reps Quad Sets: 20 reps;Both;Other (comment) Long Arc Quad: 20 reps;Both Hip Flexion/Marching: 20 reps;Both Other Exercises Other Exercises: tied theraband to his bed Leg presses x20 each leg ( advised only to do with therapist so band dosent slip; Arm punches left ; tricpes ext left; arm abduction left ; hip abduction left all 15 reps with orange band. Patient also advised not to over-do it. He needs his left arm to be strong and not sore.     General Comments  Pertinent Vitals/Pain      Home Living                      Prior Function            PT Goals (current goals can now be found in the care plan section) Acute Rehab PT Goals PT Goal Formulation: With patient Time For Goal Achievement: 12/25/18 Potential to Achieve Goals: Good    Frequency    Min 4X/week      PT Plan Current plan remains appropriate    Co-evaluation              AM-PAC PT "6 Clicks" Mobility   Outcome Measure  Help needed turning from your back to your side while in a flat bed without using bedrails?: A  Lot Help needed moving from lying on your back to sitting on the side of a flat bed without using bedrails?: A Lot Help needed moving to and from a bed to a chair (including a wheelchair)?: A Lot Help needed standing up from a chair using your arms (e.g., wheelchair or bedside chair)?: A Lot Help needed to walk in hospital room?: Total Help needed climbing 3-5 steps with a railing? : Total 6 Click Score: 10    End of Session Equipment Utilized During Treatment: Gait belt Activity Tolerance: Patient tolerated treatment well Patient left: in bed;with call bell/phone within reach   PT Visit Diagnosis: Unsteadiness on feet (R26.81);Other abnormalities of gait and mobility (R26.89);Muscle weakness (generalized) (M62.81)     Time: 1600-1620 PT Time Calculation (min) (ACUTE ONLY): 20 min  Charges:  $Therapeutic Activity: 8-22 mins                     Reinaldo Berber, PT, DPT Acute Rehabilitation Services Pager: [phone] Office: [phone]     Reinaldo Berber 12/23/2018, 4:13 PM

## 2018-12-23 NOTE — Progress Notes (Addendum)
STROKE TEAM PROGRESS NOTE      SUBJECTIVE (INTERVAL HISTORY) Lying in bed, neuro stable, still has right hemiparesis.  Complaints of constipation resolved bowel movement for 1 week, he is on stool softener for several days, will do Dulcolax suppository.  Pending CIR.   OBJECTIVE Vitals:   12/22/18 1607 12/22/18 2019 12/23/18 0055 12/23/18 0414  BP: 115/80 112/73 119/65 119/71  Pulse: 63 (!) 57 (!) 49 (!) 50  Resp: 16 14 19 18   Temp:  98.2 F (36.8 C) 97.9 F (36.6 C) 97.6 F (36.4 C)  TempSrc:  Oral Oral Oral  SpO2: 96% 96% 95% 94%  Weight:      Height:        CBC:  Recent Labs  Lab 12/17/18 0448  HGB 13.6  HCT 42.7    Basic Metabolic Panel:  Recent Labs  Lab 12/17/18 0448 12/17/18 0454  NA 134* 134*  K 4.3 3.9  CL 99 97*  CO2  --  24  GLUCOSE 394* 383*  BUN 21 19  CREATININE 0.70 0.79  CALCIUM  --  8.9    Lipid Panel:     Component Value Date/Time   CHOL 227 (H) 12/17/2018 0212   TRIG 81 12/17/2018 0212   HDL 45 12/17/2018 0212   CHOLHDL 5.0 12/17/2018 0212   VLDL 16 12/17/2018 0212   LDLCALC 166 (H) 12/17/2018 0212   HgbA1c:  Lab Results  Component Value Date   HGBA1C 13.3 (H) 12/17/2018   Urine Drug Screen: No results found for: LABOPIA, COCAINSCRNUR, LABBENZ, AMPHETMU, THCU, LABBARB  Alcohol Level No results found for: ETH  IMAGING  MRI Wo Contrast - acute infarct involving left paramedian pons, left occipital lobe.    Ct Angio Head W Or Wo Contrast 12/17/2018 IMPRESSION:   CT HEAD 1. Subtle approximate 1 cm hypodensity involving the left paramedian pons, increased in prominence from previous, and suspicious for evolving acute ischemic infarct.  2. Otherwise stable appearance of the head with associated age-related cerebral atrophy. No other acute abnormality identified.   CTA HEAD AND NECK Ct Angio Neck W Or Wo Contrast 1. 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 No significant intraluminal thrombus or raised dissection flap identified.  2. No large vessel occlusion. No other hemodynamically significant or correctable stenosis.   CXR - normal at OSH   Transthoracic Echocardiogram  Normal ejection fraction of 60 to 65%.  Mild thickening of aortic valve with calcification  EKG - SB at OSH   PHYSICAL EXAM Blood pressure 119/71, pulse (!) 50, temperature 97.6 F (36.4 C), temperature source Oral, resp. rate 18, height 5\' 11"  (1.803 m), weight 96.4 kg, SpO2 94 %.   Physical exam: Pleasant elderly Caucasian male not in distress. . Afebrile. Head is nontraumatic. Neck is supple without bruit.    Cardiac exam no murmur or gallop. Lungs are clear to auscultation. Distal pulses are well felt. Neurological Exam: Awake alert oriented to time place and person.  Mild dysarthria with mild bradyphonia.  No aphasia. Extraocular movements are full range but with saccadic dysmetria on left gaze. Right lower facial weakness.  Tongue midline. Visual field full.  Right upper extremity 0/5 strength with hypotonia.  Right lower extremity 3-/5 strength proximal, 3/5 knee extension and 0/5 distal.  Normal strength in the left side. Sensation symmetrical, FTN on the left intact. Gait not tested.   ASSESSMENT/PLAN Mr. Robert Knotts  Brewer is a 73 y.o. male with history of diabetes mellitus and HTN but not compliant medications  presenting with right sided weakness and facial droop. 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  Patient declined further cardioembolic work-up.  resultant  Right hemiparesis and dysarthria  CT head - Subtle approximate 1 cm hypodensity involving the left paramedian pons, suspicious for evolving acute ischemic infarct.   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  2D Echo - EF 60 to 65%.  Left atrium size is mildly dilated.  [employer] Virus 2 - negative at OSH.  LDL - 166  HgbA1c - 13.3  VTE prophylaxis - SCDs  Diet - per SLP  No antithrombotic prior to admission, now on 81mg  ASA. He has been resistant to [place] medicine. Will continue ASA 81 on discharge.  Ongoing aggressive stroke risk factor management  Therapy recommendations: CIR  disposition: pending  Hypertension  Stable . Long-term BP goal normotensive at this time  Hyperlipidemia  Lipid lowering medication PTA:  none  LDL 166, goal < 70  Patient has declined statin in the past and con't to refuse at time of d/c  Diabetes  HgbA1c 13.3, goal < 7.0  Uncontrolled with hyperglycemia  On lantus, increase from 25 U to 30 U  On pre-meal novolog, increase from 5 U to 7 U tid  SSI  CBG monitoring  DM coordinator on board, appreciate help  Close PCP follow-up after discharge  Constipation  No bowel movement for 1 week  He is on stool softener with senokot-S and MiraLAX  Will add Dulcolax suppository  Consider Fleet enema if needed  Other Stroke Risk Factors  Advanced age  Non compliant with medications  Other Active Problems  Non compliant with medications; resistant to [facility] day # 6  Rosalin Hawking, MD PhD Stroke Neurology 12/23/2018 6:04 PM  To contact Stroke Continuity provider, please refer to [email or web address] After hours, contact General Neurology

## 2018-12-24 ENCOUNTER — Encounter (HOSPITAL_COMMUNITY): Payer: Self-pay

## 2018-12-24 ENCOUNTER — Other Ambulatory Visit: Payer: Self-pay

## 2018-12-24 ENCOUNTER — Inpatient Hospital Stay (HOSPITAL_COMMUNITY)
Admission: RE | Admit: 2018-12-24 | Discharge: 2019-01-18 | DRG: 057 | Disposition: A | Discharge status: Home / Self Care | Payer: Medicare HMO | Source: Intra-hospital | Attending: Physical Medicine & Rehabilitation | Admitting: Physical Medicine & Rehabilitation

## 2018-12-24 DIAGNOSIS — I639 Cerebral infarction, unspecified: Secondary | ICD-10-CM

## 2018-12-24 DIAGNOSIS — Z8249 Family history of ischemic heart disease and other diseases of the circulatory system: Secondary | ICD-10-CM | POA: Diagnosis not present

## 2018-12-24 DIAGNOSIS — Z9119 Patient's noncompliance with other medical treatment and regimen: Secondary | ICD-10-CM

## 2018-12-24 DIAGNOSIS — I69351 Hemiplegia and hemiparesis following cerebral infarction affecting right dominant side: Principal | ICD-10-CM

## 2018-12-24 DIAGNOSIS — I10 Essential (primary) hypertension: Secondary | ICD-10-CM | POA: Diagnosis present

## 2018-12-24 DIAGNOSIS — E11649 Type 2 diabetes mellitus with hypoglycemia without coma: Secondary | ICD-10-CM | POA: Diagnosis not present

## 2018-12-24 DIAGNOSIS — I69322 Dysarthria following cerebral infarction: Secondary | ICD-10-CM

## 2018-12-24 DIAGNOSIS — R001 Bradycardia, unspecified: Secondary | ICD-10-CM | POA: Diagnosis not present

## 2018-12-24 DIAGNOSIS — I6350 Cerebral infarction due to unspecified occlusion or stenosis of unspecified cerebral artery: Secondary | ICD-10-CM | POA: Diagnosis not present

## 2018-12-24 DIAGNOSIS — G8191 Hemiplegia, unspecified affecting right dominant side: Secondary | ICD-10-CM | POA: Diagnosis not present

## 2018-12-24 DIAGNOSIS — E119 Type 2 diabetes mellitus without complications: Secondary | ICD-10-CM

## 2018-12-24 DIAGNOSIS — F329 Major depressive disorder, single episode, unspecified: Secondary | ICD-10-CM | POA: Diagnosis not present

## 2018-12-24 DIAGNOSIS — Z8042 Family history of malignant neoplasm of prostate: Secondary | ICD-10-CM | POA: Diagnosis not present

## 2018-12-24 DIAGNOSIS — I69392 Facial weakness following cerebral infarction: Secondary | ICD-10-CM | POA: Diagnosis not present

## 2018-12-24 DIAGNOSIS — K5900 Constipation, unspecified: Secondary | ICD-10-CM | POA: Diagnosis present

## 2018-12-24 HISTORY — DX: Cerebral infarction, unspecified: I63.9

## 2018-12-24 LAB — GLUCOSE, CAPILLARY
Glucose-Capillary: 232 mg/dL — ABNORMAL HIGH (ref 70–99)
Glucose-Capillary: 228 mg/dL — ABNORMAL HIGH (ref 70–99)
Glucose-Capillary: 200 mg/dL — ABNORMAL HIGH (ref 70–99)
Glucose-Capillary: 99 mg/dL (ref 70–99)

## 2018-12-24 MED ORDER — SORBITOL 70 % SOLN
30.0000 mL | Freq: Every day | Status: DC | PRN
  Administered 2018-12-26 – 2018-12-28 (×2): 30 mL via ORAL
  Filled 2018-12-24 (×3): qty 30

## 2018-12-24 MED ORDER — INSULIN ASPART 100 UNIT/ML ~~LOC~~ SOLN
0.0000 [IU] | Freq: Three times a day (TID) | SUBCUTANEOUS | Status: DC
  Administered 2018-12-25: 3 [IU] via SUBCUTANEOUS
  Administered 2018-12-25: 2 [IU] via SUBCUTANEOUS
  Administered 2018-12-25: 1 [IU] via SUBCUTANEOUS
  Administered 2018-12-26: 2 [IU] via SUBCUTANEOUS
  Administered 2018-12-26: 1 [IU] via SUBCUTANEOUS
  Administered 2018-12-26: 2 [IU] via SUBCUTANEOUS
  Administered 2018-12-27: 1 [IU] via SUBCUTANEOUS
  Administered 2018-12-28: 13:00:00 2 [IU] via SUBCUTANEOUS
  Administered 2018-12-29: 5 [IU] via SUBCUTANEOUS
  Administered 2018-12-30: 1 [IU] via SUBCUTANEOUS
  Administered 2019-01-05: 3 [IU] via SUBCUTANEOUS

## 2018-12-24 MED ORDER — INSULIN GLARGINE 100 UNIT/ML ~~LOC~~ SOLN: 30.0000 [IU] | Freq: Every day | SUBCUTANEOUS | 11 refills | Status: DC

## 2018-12-24 MED ORDER — ACETAMINOPHEN 650 MG RE SUPP: 650.0000 mg | RECTAL | Status: DC | PRN

## 2018-12-24 MED ORDER — ASPIRIN EC 81 MG PO TBEC
81.0000 mg | DELAYED_RELEASE_TABLET | Freq: Every day | ORAL | Status: DC
  Administered 2018-12-25 – 2019-01-18 (×25): 81 mg via ORAL
  Filled 2018-12-24 (×25): qty 1

## 2018-12-24 MED ORDER — PANTOPRAZOLE SODIUM 40 MG PO TBEC
40.0000 mg | DELAYED_RELEASE_TABLET | Freq: Every day | ORAL | Status: DC
  Administered 2018-12-24 – 2018-12-26 (×3): 40 mg via ORAL
  Filled 2018-12-24 (×15): qty 1

## 2018-12-24 MED ORDER — INSULIN ASPART 100 UNIT/ML ~~LOC~~ SOLN: 0.0000 [IU] | Freq: Three times a day (TID) | SUBCUTANEOUS | 11 refills | Status: DC

## 2018-12-24 MED ORDER — INSULIN ASPART 100 UNIT/ML ~~LOC~~ SOLN
7.0000 [IU] | Freq: Three times a day (TID) | SUBCUTANEOUS | Status: DC
  Administered 2018-12-25 – 2018-12-31 (×16): 7 [IU] via SUBCUTANEOUS

## 2018-12-24 MED ORDER — POLYETHYLENE GLYCOL 3350 17 G PO PACK: 17.0000 g | PACK | Freq: Every day | ORAL | 0 refills | Status: DC | PRN

## 2018-12-24 MED ORDER — SENNOSIDES-DOCUSATE SODIUM 8.6-50 MG PO TABS: 1.0000 | ORAL_TABLET | Freq: Two times a day (BID) | ORAL | Status: DC

## 2018-12-24 MED ORDER — INSULIN GLARGINE 100 UNIT/ML ~~LOC~~ SOLN
30.0000 [IU] | Freq: Every day | SUBCUTANEOUS | Status: DC
  Administered 2018-12-25: 30 [IU] via SUBCUTANEOUS
  Filled 2018-12-24: qty 0.3

## 2018-12-24 MED ORDER — ACETAMINOPHEN 325 MG PO TABS
650.0000 mg | ORAL_TABLET | ORAL | Status: DC | PRN
  Filled 2018-12-24: qty 2

## 2018-12-24 MED ORDER — SENNOSIDES-DOCUSATE SODIUM 8.6-50 MG PO TABS
1.0000 | ORAL_TABLET | Freq: Two times a day (BID) | ORAL | Status: DC
  Administered 2018-12-24 – 2018-12-25 (×2): 1 via ORAL
  Filled 2018-12-24 (×2): qty 1

## 2018-12-24 MED ORDER — INSULIN ASPART 100 UNIT/ML ~~LOC~~ SOLN: 0.0000 [IU] | Freq: Every day | SUBCUTANEOUS | 11 refills | Status: DC

## 2018-12-24 MED ORDER — ACETAMINOPHEN 160 MG/5ML PO SOLN: 650.0000 mg | ORAL | Status: DC | PRN

## 2018-12-24 MED ORDER — INSULIN ASPART 100 UNIT/ML ~~LOC~~ SOLN: 7.0000 [IU] | Freq: Three times a day (TID) | SUBCUTANEOUS | 11 refills | Status: DC

## 2018-12-24 MED ORDER — POLYETHYLENE GLYCOL 3350 17 G PO PACK
17.0000 g | PACK | Freq: Every day | ORAL | Status: DC | PRN
  Administered 2018-12-26: 17 g via ORAL
  Filled 2018-12-24: qty 1

## 2018-12-24 NOTE — Progress Notes (Signed)
Report given to Roselyn Reef, RN on 4W rehab. Pt belongings have been packed, IV sl pt eating dinner will transport when he is done. Pt not in any pain and does not have any concerns or complaints.

## 2018-12-24 NOTE — Progress Notes (Signed)
Meredith Staggers, MD  Physician  Physical Medicine and Rehabilitation  PMR Pre-admission  Signed  Date of Service:  12/19/2018 5:43 PM       Related encounter: Admission (Discharged) from 12/17/2018 in [facility]      Signed         PMR Admission Coordinator Pre-Admission Assessment  Patient: Robert Brewer is an 73 y.o., male MRN: [id number] DOB: 1945-12-04 Height: '5\' 11"'$  (180.3 cm) Weight: 96.4 kg  Insurance Information HMO: yes    PPO:      PCP:      IPA:      80/20:      OTHER:  PRIMARY: [employer]      Policy#: [id number]      Subscriber: Patient CM Name: Mortimer Fries      Phone#: [phone]     Fax#: [phone] Pre-Cert#: [id number]      Employer:  Josem Kaufmann provided by Mortimer Fries for admit to CIR. Pt is approved 6/1 with follow up clinicals due 6/8 and every week til DC to [facility] [facility] (p): [phone] ext [phone] (f): [phone] Benefits:  Phone #: NA     Name: [email or web address] Eff. Date: 07/25/2017 still active     Deduct: $0      Out of Pocket Max: $3,400 ($0 met)      Life Max: NA CIR: $295/day for days 1-6, $0/day for days 7+      SNF: $0/day for days 1-20, $178/day for days 21-100, limited to 100 days/cal yr  Outpatient: limited by medical necessity     Co-Pay: $10-40/visit pending service Home Health: 100%, limited by medical necessity      Co-Pay: 0% DME: 80%     Co-Pay: 20% Providers:  SECONDARY: None      Policy#:       Subscriber:  CM Name:       Phone#:      Fax#:  Pre-Cert#:       Employer:  Benefits:  Phone #:      Name:  Eff. Date:      Deduct:       Out of Pocket Max:       Life Max:  CIR:       SNF:  Outpatient:      Co-Pay:  Home Health:       Co-Pay:  DME:      Co-Pay:   Medicaid Application Date:       Case Manager:  Disability Application Date:       Case Worker:   The Data Collection Information Summary for patients in Inpatient Rehabilitation Facilities with attached Privacy Act Cash Records  was provided and verbally reviewed with: Patient  Emergency Contact Information         Contact Information    Name Relation Home Work Mobile   [place] Spouse [phone]       Current Medical History  Patient Admitting Diagnosis: 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 Patient on no prescription medications. Pt presented to [facility] 12/17/2018 with acute onset of right side weakness, dysarthria and facial droop while mowing the grass. CT of the head at outside hospital showed no hemorrhage. Patient did receive TPA and was transferred to [facility]. CT angiogram of head and neck showed 1 cm hypodensity involving the left paramedian pons and suspicious  for evolving acute ischemic infarct. CTA head and neck with no large vessel occlusion no significant intraluminal thrombus or dissection identified. MRI confirms acute infarct left paramedian pons small acute cortical infarct left occipital lobe. Negative for hemorrhage. Currently maintained on aspirin for CVA prophylaxis. Echocardiogram with ejection fraction of 32% normal systolic function. Negative bubble study. Recommendations hadbeen made for loop recorder placement and patient refused. Hemoglobin A1c 13.3 with insulin therapy as directed. Tolerating a regular consistency diet. Therapy evaluations completed and patient has been recommended for CIR. Pt is to be admitted for a comprehensive rehab program on 12/24/18.  Complete NIHSS TOTAL: 9  Patient's medical record from [facility] has been reviewed by the rehabilitation admission coordinator and physician.  Past Medical History  History reviewed. No pertinent past medical history.  Family History   family history includes Congestive Heart Failure in his  father; Other in his mother; Prostate cancer in his brother and father; Valvular heart disease in his sister.  Prior Rehab/Hospitalizations Has the patient had prior rehab or hospitalizations prior to admission? No  Has the patient had major surgery during 100 days prior to admission? No              Current Medications  Current Facility-Administered Medications:     stroke: mapping our early stages of recovery book, , Does not apply, Once, Aroor, Lanice Schwab, MD   acetaminophen (TYLENOL) tablet 650 mg, 650 mg, Oral, Q4H PRN **OR** acetaminophen (TYLENOL) solution 650 mg, 650 mg, Per Tube, Q4H PRN **OR** acetaminophen (TYLENOL) suppository 650 mg, 650 mg, Rectal, Q4H PRN, Aroor, Lanice Schwab, MD   aspirin EC tablet 81 mg, 81 mg, Oral, Daily, Greta Doom, MD, 81 mg at 12/24/18 0837   insulin aspart (novoLOG) injection 0-5 Units, 0-5 Units, Subcutaneous, QHS, Garvin Fila, MD, 2 Units at 12/22/18 2104   insulin aspart (novoLOG) injection 0-9 Units, 0-9 Units, Subcutaneous, TID WC, Garvin Fila, MD, 3 Units at 12/24/18 1151   insulin aspart (novoLOG) injection 7 Units, 7 Units, Subcutaneous, TID WC, Rosalin Hawking, MD, 7 Units at 12/24/18 1151   insulin glargine (LANTUS) injection 30 Units, 30 Units, Subcutaneous, Daily, Rosalin Hawking, MD, 30 Units at 12/24/18 0837   pantoprazole (PROTONIX) EC tablet 40 mg, 40 mg, Oral, QHS, Sethi, Pramod S, MD, 40 mg at 12/23/18 2151   polyethylene glycol (MIRALAX / GLYCOLAX) packet 17 g, 17 g, Oral, Daily PRN, Garvin Fila, MD, 17 g at 12/23/18 1414   senna-docusate (Senokot-S) tablet 1 tablet, 1 tablet, Oral, BID, Rosalin Hawking, MD, 1 tablet at 12/24/18 9518  Patients Current Diet:     Diet Order                  Diet Carb Modified Fluid consistency: Thin; Room service appropriate? Yes with Assist  Diet effective now               Precautions / Restrictions Precautions Precautions: Fall Precaution Comments: right  hemiparesis, double vision better but not cleared Restrictions Weight Bearing Restrictions: No   Has the patient had 2 or more falls or a fall with injury in the past year? No  Prior Activity Level Community (5-7x/wk): retired but very active and independent PTA; drove; did yard work  Prior Functional Level Self Care: Did the patient need help bathing, dressing, using the toilet or eating? Independent  Indoor Mobility: Did the patient need assistance with walking from room to room (with or without device)? Independent  Stairs: Did the patient need assistance with internal or external stairs (with or without device)? Independent  Functional Cognition: Did the patient need help planning regular tasks such as shopping or remembering to take medications? Independent  Home Assistive Devices / Equipment Home Equipment: Cane - single point  Prior Device Use: Indicate devices/aids used by the patient prior to current illness, exacerbation or injury? None of the above  Current Functional Level Cognition  Arousal/Alertness: Awake/alert Overall Cognitive Status: Within Functional Limits for tasks assessed Orientation Level: Oriented X4 General Comments: Patient les s emotioanl today Attention: Focused, Sustained Focused Attention: Appears intact Sustained Attention: Appears intact Memory: Appears intact(Immediate: 3/3; Delayed: 3/3) Awareness: Appears intact Problem Solving: Appears intact(5/5) Executive Function: Sequencing Sequencing: Appears intact    Extremity Assessment (includes Sensation/Coordination)  Upper Extremity Assessment: Defer to OT evaluation RUE Deficits / Details: Pt with no active movement in RUE at this time. PROM for shoulder flexion to 90* due to pain (3/10). Will monitor RUE Sensation: WNL RUE Coordination: (no active movement RUE at this time)  Lower Extremity Assessment: RLE deficits/detail RLE Deficits / Details: Does have active firing of the  right leg but weak compared to the left     ADLs  Overall ADL's : Needs assistance/impaired Eating/Feeding: Set up Eating/Feeding Details (indicate cue type and reason): sitting supported in chair. If sitting EOB would need close supervision to occassional min a for balance.  Grooming: Wash/dry hands, Wash/dry face, Oral care, Minimal assistance, Set up, Sitting Grooming Details (indicate cue type and reason): in supported chair Upper Body Bathing: Minimal assistance, Sitting Upper Body Bathing Details (indicate cue type and reason): Assistance to pick up right UE.  Lower Body Bathing: Maximal assistance, Sit to/from stand Upper Body Dressing : Moderate assistance, Sitting Lower Body Dressing: Maximal assistance, Sit to/from stand Toilet Transfer: Maximal assistance, Cueing for sequencing, Cueing for safety Toilet Transfer Details (indicate cue type and reason): Simulated to recliner with PT completing. Transferred from bed to recliner going towards the left. See PT note. Toileting- Clothing Manipulation and Hygiene: Maximal assistance Functional mobility during ADLs: +2 for physical assistance, Moderate assistance General ADL Comments: R bias with decreased RLE control. Pt able to weight shift to midline and then to L with cues but unable to maintain.  Pt aware he was losing balance to the left however no attempts to remediate.     Mobility  Overal bed mobility: Needs Assistance Bed Mobility: Supine to Sit Supine to sit: Min assist General bed mobility comments: min A with head of bed elevated, assist to scoot to edge of bed for Rt hip    Transfers  Overall transfer level: Needs assistance Equipment used: Hemi-walker Transfers: Sit to/from Stand, [employer] Transfers Sit to Stand: Mod assist Stand pivot transfers: Mod assist General transfer comment: sit <> stand  3 x 5 without UE assist with focus on midline orientation and Rt LE strengthening with mod A    Ambulation /  Gait / Stairs / Wheelchair Mobility  Ambulation/Gait Ambulation/Gait assistance: Mod assist Gait Distance (Feet): 6 Feet Assistive device: Hemi-walker Gait Pattern/deviations: Decreased weight shift to left, Ataxic, Narrow base of support, Decreased stride length General Gait Details: pt able to advance Rt LE with min facilitation, manual facilitation for motor planning, wt shifts and coordination Gait velocity: decreased  Gait velocity interpretation: <1.31 ft/sec, indicative of household ambulator Stairs: Yes    Posture / Balance Dynamic Sitting Balance Sitting balance - Comments: No aassistance required to remain sitting today Balance  Overall balance assessment: Needs assistance Sitting-balance support: Single extremity supported, Feet supported Sitting balance-Leahy Scale: Good Sitting balance - Comments: No aassistance required to remain sitting today Postural control: Right lateral lean, Posterior lean Standing balance support: Single extremity supported Standing balance-Leahy Scale: Poor Standing balance comment: improved ability to find center. Less pushing to the right  High Level Balance Comments: standing pre gait wtih stepping with Rt LE with mod A for coordination, Rt LE stance control. standing wt shifts without AD with focus on anterior wt shift to reduce posterior lean    Special needs/care consideration BiPAP/CPAP : no CPM :no Continuous Drip IV: no Dialysis : no        Days : no Life Vest : no Oxygen  :no Special Bed : no Trach Size : no Wound Vac (area) : no      Location : no Skin : wound to right head.   Bowel mgmt: last BM: 12/23/18, continent.  Bladder mgmt: continent. Diabetic mgmt: yes, would benefit from training/education Behavioral consideration : no Chemo/radiation : no   Previous Home Environment (from acute therapy documentation) Living Arrangements: Spouse/significant other  Lives With: Spouse Available Help at Discharge: Family, Available  24 hours/day Type of Home: House Home Layout: Two level Home Access: Stairs to enter [employer] of Steps: 1 step to back porch no railing Bathroom Shower/Tub: Multimedia programmer: Standard Bathroom Accessibility: Yes  Discharge Living Setting Plans for Discharge Living Setting: Patient's home, Lives with (comment)(wife) Type of Home at Discharge: House Discharge Home Layout: Two level, Able to live on main level with bedroom/bathroom(with basement) Alternate Level Stairs-Rails: None Alternate Level Stairs-Number of Steps: unknown, not needed Discharge Home Access: Stairs to enter Entrance Stairs-Rails: None Entrance Stairs-Number of Steps: 2 in front, 4 in back (discussed ramp use) Discharge Bathroom Shower/Tub: Walk-in shower Discharge Bathroom Toilet: Standard Discharge Bathroom Accessibility: Yes How Accessible: Accessible via walker Does the patient have any problems obtaining your medications?: No  Social/Family/Support Systems Patient Roles: Spouse Contact Information: wife: Kalman Shan [phone] Anticipated Caregiver: wife Anticipated Caregiver's Contact Information: see above Ability/Limitations of Caregiver: min A Caregiver Availability: 24/7 Discharge Plan Discussed with Primary Caregiver: Yes Is Caregiver In Agreement with Plan?: Yes Does Caregiver/Family have Issues with Lodging/Transportation while Pt is in Rehab?: No  Goals/Additional Needs Patient/Family Goal for Rehab: PT/OT: Supervision/Min A; SLP: Supervision Expected length of stay: 16-20 days Cultural Considerations: religious  Dietary Needs: carb modified, thin liquids; calorie level 1600-2000; room service with assist. Equipment Needs: TBD Pt/Family Agrees to Admission and willing to participate: Yes Program Orientation Provided & Reviewed with Pt/Caregiver Including Roles  & Responsibilities: Yes(pt and wife)  Barriers to Discharge: Home environment  access/layout, Lack of/limited family support, Medication compliance  Barriers to Discharge Comments: not compliant with insulin in the past; wife can only provide Min A   Decrease burden of Care through IP rehab admission: NA  Possible need for SNF placement upon discharge: Not anticipated if pt able to achieve Min A goals through CIR. Wife open to ramp use at home; hoping for light Min A at DC.   Patient Condition: I have reviewed medical records from [facility], spoken with CSW, and patient and spouse. I met with patient at the bedside for inpatient rehabilitation assessment.  Patient will benefit from ongoing PT, OT and SLP, can actively participate in 3 hours of therapy a day 5 days of the week, and can make measurable gains during the admission.  Patient will  also benefit from the coordinated team approach during an Inpatient Acute Rehabilitation admission.  The patient will receive intensive therapy as well as Rehabilitation physician, nursing, social worker, and care management interventions.  Due to bladder management, bowel management, safety, skin/wound care, disease management, medication administration, pain management and patient education the patient requires 24 hour a day rehabilitation nursing.  The patient is currently Mod A with mobility and Min/Max A for basic ADLs.  Discharge setting and therapy post discharge at home with home health is anticipated.  Patient has agreed to participate in the Acute Inpatient Rehabilitation Program and will admit 12/24/18.  Preadmission Screen Completed By:  Jhonnie Garner, 12/24/2018 3:39 PM ______________________________________________________________________   Discussed status with Dr. Naaman Plummer on 12/24/18 at 3:37PM and received approval for admission today.  Admission Coordinator:  Jhonnie Garner, OT, time 3:37PM/Date 12/24/18   Assessment/Plan: Diagnosis: left pontine and occipital infarct 1. Does the need for close, 24 hr/day  Medical supervision in concert with the patient's rehab needs make it unreasonable for this patient to be served in a less intensive setting? Yes 2. Co-Morbidities requiring supervision/potential complications: dm, htn 3. Due to bladder management, bowel management, safety, skin/wound care, disease management, medication administration, pain management and patient education, does the patient require 24 hr/day rehab nursing? Yes 4. Does the patient require coordinated care of a physician, rehab nurse, PT (1-2 hrs/day, 5 days/week), OT (1-2 hrs/day, 5 days/week) and SLP (1-2 hrs/day, 5 days/week) to address physical and functional deficits in the context of the above medical diagnosis(es)? Yes Addressing deficits in the following areas: balance, endurance, locomotion, strength, transferring, bowel/bladder control, bathing, dressing, feeding, grooming, toileting, cognition, speech and psychosocial support 5. Can the patient actively participate in an intensive therapy program of at least 3 hrs of therapy 5 days a week? Yes 6. The potential for patient to make measurable gains while on inpatient rehab is excellent 7. Anticipated functional outcomes upon discharge from inpatients are: supervision and min assist PT, supervision and min assist OT, supervision SLP 8. Estimated rehab length of stay to reach the above functional goals is:16-20 days 9. Anticipated D/C setting: Home 10. Anticipated post D/C treatments: [place] therapy 11. Overall Rehab/Functional Prognosis: excellent  MD Signature: Meredith Staggers, MD, [facility] 12/24/2018         Revision History    Date/Time User Provider Type Action  12/24/2018 4:20 PM Meredith Staggers, MD Physician Sign  12/24/2018 3:40 PM Jhonnie Garner, [place] [facility] Admission Coordinator Share  View Details Report

## 2018-12-24 NOTE — TOC Transition Note (Signed)
Transition of Care [facility]) - CM/SW Discharge Note   Patient Details  Name: Robert Brewer MRN: [id number] Date of Birth: [birth year]  [facility] CM/SW Contact:  Pollie Friar, RN Phone Number: 12/24/2018, 5:12 PM   Clinical Narrative:    Pt discharging to CIR today. If pt in rehab on 16th PCP appt will need to be adjusted. CM signing off.   Final next level of care: IP Rehab Facility Barriers to Discharge: Barriers Resolved   Patient Goals and CMS Choice Patient states their goals for this hospitalization and ongoing recovery are:: wants to get use of rt arm back      Discharge Placement                       Discharge Plan and Services   Discharge Planning Services: CM Consult                                 Social Determinants of Health (SDOH) Interventions     Readmission Risk Interventions No flowsheet data found.

## 2018-12-24 NOTE — Progress Notes (Signed)
Inpatient Rehabilitation-Admissions Coordintor   AC has received medical clearance and insurance approval for admit to CIR today. Pt, family, and RN updated on plan.   Please call if questions.   Jhonnie Garner, OTR/L  Rehab Admissions Coordinator  [phone] 12/24/2018 3:19 PM

## 2018-12-24 NOTE — Discharge Summary (Addendum)
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 No pertinent past medical history. History reviewed. No pertinent surgical history.  Medications to be continued on Rehab Allergies as of 12/24/2018   Not on File     Medication List    TAKE these medications   acetaminophen 325 MG tablet Commonly known as:  TYLENOL Take 2 tablets (650 mg total) by mouth every 4 (four) hours as needed for mild pain (or temp > 37.5 C (99.5 F)).   aspirin 81 MG EC tablet Take 1 tablet (81 mg total) by mouth daily.   insulin aspart 100 UNIT/ML injection Commonly known as:  novoLOG Inject 0-9 Units into the skin 3 (three) times daily with meals.   insulin aspart 100 UNIT/ML injection Commonly known as:  novoLOG Inject 0-5 Units into the skin at bedtime.   insulin aspart 100 UNIT/ML injection Commonly known as:  novoLOG Inject 0-9 Units into the skin 3 (three) times daily with meals.   insulin aspart 100 UNIT/ML injection Commonly known as:  novoLOG Inject 7 Units into the skin 3 (three) times daily with meals.   insulin glargine 100 UNIT/ML injection Commonly known as:  LANTUS Inject 0.3 mLs (30 Units total) into  the skin daily. Start taking on:  December 25, 2018   pantoprazole 40 MG tablet Commonly known as:  PROTONIX Take 1 tablet (40 mg total) by mouth at bedtime.   polyethylene glycol 17 g packet Commonly known as:  MIRALAX / GLYCOLAX Take 17 g by mouth daily as needed for moderate constipation.   senna-docusate 8.6-50 MG tablet Commonly known as:  Senokot-S Take 1 tablet by mouth 2 (two) times daily.     ASK your doctor about these medications    stroke: mapping our early stages of recovery book Misc 1 each by Does not apply route once for 1 dose. Ask about: Should I take this medication?       LABORATORY STUDIES CBC    Component Value Date/Time   HGB 13.6 12/17/2018 0448   HCT 40.0 12/17/2018 0448   CMP    Component Value Date/Time   NA 134 (L) 12/17/2018 0454   K 3.9 12/17/2018 0454   CL 97 (L) 12/17/2018 0454   CO2 24 12/17/2018 0454   GLUCOSE 383 (H) 12/17/2018 0454   BUN 19 12/17/2018 0454   CREATININE 0.79 12/17/2018 0454   CALCIUM 8.9 12/17/2018 0454   GFRNONAA >60 12/17/2018 0454   GFRAA >60 12/17/2018 0454   COAGSNo results found for: INR, PROTIME Lipid Panel    Component Value Date/Time   CHOL 227 (H) 12/17/2018 0212   TRIG 81 12/17/2018 0212   HDL  45 12/17/2018 0212   CHOLHDL 5.0 12/17/2018 0212   VLDL 16 12/17/2018 0212   LDLCALC 166 (H) 12/17/2018 0212   HgbA1C  Lab Results  Component Value Date   HGBA1C 13.3 (H) 12/17/2018   Urinalysis No results found for: COLORURINE, APPEARANCEUR, LABSPEC, PHURINE, GLUCOSEU, HGBUR, BILIRUBINUR, KETONESUR, PROTEINUR, UROBILINOGEN, NITRITE, LEUKOCYTESUR Urine Drug Screen No results found for: LABOPIA, COCAINSCRNUR, LABBENZ, AMPHETMU, THCU, LABBARB  Alcohol Level No results found for: [place] STUDIES CT HEAD 12/17/2018 IMPRESSION:  1. Subtle approximate 1 cm hypodensity involving the left paramedian pons, increased in prominence from previous, and suspicious for evolving acute ischemic  infarct.  2. Otherwise stable appearance of the head with associated age-related cerebral atrophy. No other acute abnormality identified.   Ct Angio Head W Or Wo Contrast Ct Angio Neck W Or Wo Contrast 12/17/2018 IMPRESSION:  1. 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 No significant intraluminal thrombus or raised dissection flap identified.  2. No large vessel occlusion. No other hemodynamically significant or correctable stenosis.   MRI Wo Contrast  12/17/2018 IMPRESSION:  acute infarct involving left paramedian pons, left occipital lobe.  CXR - normal at OSH  Transthoracic Echocardiogram w/ bubble study Normal ejection fraction of 60 to 65%.  Mild thickening of aortic valve with calcification  EKG - SB at [place] is a 73 y.o. male with past medical history of diabetes mellitus, HTN, not compliant with medications presents to the emergency department with right-sided weakness and facial droop that began at 5 PM on 12/16/2018 ([facility]).  Patient was mowing his lawn when his symptoms started, however his symptoms worsened and his wife called 911. He was taken to [facility] ED.   Patient was evaluated by teleneurology at [facility] and patient received IV TPA. NIHSS 5. Baseline MRS 0   CT head was performed which showed no hemorrhage and aspects 10.  CT angiogram was performed which was read as negative for large vessel occlusion. It appears at 8:50 PM, telemetry neurologist was informed that patient may have non-occlusive mid basilar nonocclusive thrombus.  Patient arrived to [facility] around 1:30 AM 12/17/2018.  On examination patient is alert but has severe dysarthria, right-sided hemiplegia.  NIH stroke scale 10, worse compared to presentation at [facility].  Other Work-up in  ED included CBC: No leukocytosis normal platelets (188k).  INR 1.0.  SARS-CoV-2 test negative. Chest x-ray was normal.  EKG shows sinus bradycardia   HOSPITAL COURSE Mr. KAINOAH BARTOSIEWICZ is a 73 y.o. male with history of diabetes mellitus, HTN, not compliant with medications presenting with right sided weakness and facial droop. 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  Patient declined further cardioembolic work-up.  CT head - Subtle approximate 1 cm hypodensity involving the left paramedian pons, suspicious for evolving acute ischemic infarct.   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  2D Echo - EF 60 to 65%.  Left atrium size is mildly dilated.  [employer] Virus 2 - negative at OSH.  LDL - 166  HgbA1c - 13.3  No antithrombotic prior to admission, now on 81mg  ASA. He has been resistant to [place] medicine. Will continue ASA 81 on discharge.  Therapy recommendations: CIR  Disposition: CIR  Hypertension  Stable  BP goal normotensive at this time  Hyperlipidemia  Lipid lowering medication PTA:  none  LDL 166, goal < 70  Patient has declined statin in the past and con't to refuse at time of d/c  Diabetes  HgbA1c 13.3, goal < 7.0  Uncontrolled with hyperglycemia  On lantus, increase from 25 U to 30 U  On pre-meal novolog, increase from 5 U to 7 U tid  DM coordinator consulted  Close PCP follow-up after discharge  Constipation  No bowel movement for 1 week  He is on stool softener with senokot-S and MiraLAX  Will add Dulcolax suppository  Had BM 12/23/2018  Other Stroke Risk Factors  Advanced age  Other Active  Problems  Non compliant with medications; resistant to Western medicine   DISCHARGE EXAM Blood pressure 130/79, pulse (!) 57, temperature 98.4 F (36.9 C), temperature source Oral, resp. rate 16, height 5\' 11"  (1.803 m), weight 96.4 kg, SpO2 95 %. Pleasant elderly Caucasian male not in distress. Afebrile. Head is nontraumatic. Neck is supple without bruit. Cardiac exam no murmur or gallop. Lungs are clear to auscultation. Distal pulses are well felt. Neurological Exam: Awake alert oriented to time place and person.  Mild dysarthria with mild bradyphonia.  No aphasia. Extraocular movements are full range but with saccadic dysmetria on left gaze. Right lower facial weakness.  Tongue midline. Visual field full.  Right upper extremity 0/5 strength with hypotonia.  Right lower extremity 3-/5 strength proximal, 3/5 knee extension and 0/5 distal.  Normal strength in the left side. Sensation symmetrical, FTN on the left intact. Gait not tested.  Discharge Diet  Carb modified thin liquids  DISCHARGE PLAN  Disposition:  Transfer to [place] for ongoing PT, OT and ST  aspirin 81 mg daily for secondary stroke prevention    Recommend ongoing stroke risk factor control by Primary Care Physician at time of discharge from inpatient rehabilitation.  Follow-up PCP in 2 weeks following discharge from rehab - has appt 01/08/2019 at 1200 at [place] in [facility] in 4 weeks following discharge from rehab, office to schedule an appointment.   35 minutes were spent preparing discharge.  Rosalin Hawking, MD PhD Stroke Neurology 12/24/2018 5:39 PM

## 2018-12-24 NOTE — Progress Notes (Signed)
Physical Therapy Treatment Patient Details Name: Robert Brewer MRN: [id number] DOB: [birth year] Today's Date: 12/24/2018    History of Present Illness Pt is a 73 y.o. male with past medical history of diabetes mellitus, HTN not compliant with medications who presented to the emergency department with right-sided weakness and facial droop. CT of the head revealed subtle approximate 1 cm hypodensity involving the left paramedian pons, increased in prominence from previous, and suspicious for evolving acute ischemic infarct. IV tPA was administered and he presented with severe dysarthria when he was seen by neurology.      PT Comments    Pt progressing well and motivated to improve. Pt performs NMR for Rt LE strength and coordination with overall mod A.   Follow Up Recommendations  CIR     Equipment Recommendations       Recommendations for Other Services       Precautions / Restrictions Precautions Precautions: Fall Precaution Comments: right hemiparesis, double vision better but not cleared Restrictions Weight Bearing Restrictions: No    Mobility  Bed Mobility   Bed Mobility: Supine to Sit     Supine to sit: Min assist     General bed mobility comments: min A with head of bed elevated, assist to scoot to edge of bed for Rt hip  Transfers       Sit to Stand: Mod assist         General transfer comment: sit <> stand  3 x 5 without UE assist with focus on midline orientation and Rt LE strengthening with mod A  Ambulation/Gait Ambulation/Gait assistance: Mod assist Gait Distance (Feet): 6 Feet Assistive device: Hemi-walker       General Gait Details: pt able to advance Rt LE with min facilitation, manual facilitation for motor planning, wt shifts and coordination   Stairs             Wheelchair Mobility    Modified Rankin (Stroke Patients Only)       Balance                               High Level Balance Comments: standing pre  gait wtih stepping with Rt LE with mod A for coordination, Rt LE stance control. standing wt shifts without AD with focus on anterior wt shift to reduce posterior lean            Cognition Arousal/Alertness: Awake/alert Behavior During Therapy: WFL for tasks assessed/performed Overall Cognitive Status: Within Functional Limits for tasks assessed                                        Exercises      General Comments        Pertinent Vitals/Pain Pain Assessment: No/denies pain    Home Living                      Prior Function            PT Goals (current goals can now be found in the care plan section) Progress towards PT goals: Progressing toward goals    Frequency    Min 4X/week      PT Plan Current plan remains appropriate    Co-evaluation              AM-PAC PT "6 Clicks"  Mobility   Outcome Measure  Help needed turning from your back to your side while in a flat bed without using bedrails?: A Lot Help needed moving from lying on your back to sitting on the side of a flat bed without using bedrails?: A Lot Help needed moving to and from a bed to a chair (including a wheelchair)?: A Lot Help needed standing up from a chair using your arms (e.g., wheelchair or bedside chair)?: A Lot Help needed to walk in hospital room?: Total Help needed climbing 3-5 steps with a railing? : Total 6 Click Score: 10    End of Session   Activity Tolerance: Patient tolerated treatment well Patient left: in chair;with call bell/phone within reach;with chair alarm set   PT Visit Diagnosis: Unsteadiness on feet (R26.81);Other abnormalities of gait and mobility (R26.89);Muscle weakness (generalized) (M62.81)     Time: 3967-2897 PT Time Calculation (min) (ACUTE ONLY): 25 min  Charges:  $Neuromuscular Re-education: 23-37 mins                     [name], PT, DPT   [name] 12/24/2018, 11:00 AM

## 2018-12-24 NOTE — Progress Notes (Signed)
Patient just arrived onto unit. Report received from [place]. Pt A&O x4, in no apparent distress. Reports no pain at this time.

## 2018-12-24 NOTE — Care Management Important Message (Signed)
Important Message  Patient Details  Name: Robert Brewer MRN: [id number] Date of Birth: 02/27/1946   Medicare Important Message Given:  Yes    Tilla Wilborn [address] 12/24/2018, 4:04 PM

## 2018-12-25 ENCOUNTER — Inpatient Hospital Stay (HOSPITAL_COMMUNITY): Payer: Medicare HMO

## 2018-12-25 ENCOUNTER — Inpatient Hospital Stay (HOSPITAL_COMMUNITY): Payer: Medicare HMO | Admitting: Occupational Therapy

## 2018-12-25 ENCOUNTER — Inpatient Hospital Stay (HOSPITAL_COMMUNITY): Payer: Medicare HMO | Admitting: Physical Therapy

## 2018-12-25 DIAGNOSIS — G8191 Hemiplegia, unspecified affecting right dominant side: Secondary | ICD-10-CM

## 2018-12-25 LAB — CBC WITH DIFFERENTIAL/PLATELET
Abs Immature Granulocytes: 0.02 10*3/uL (ref 0.00–0.07)
Basophils Absolute: 0 10*3/uL (ref 0.0–0.1)
Basophils Relative: 0 %
Eosinophils Absolute: 0.2 10*3/uL (ref 0.0–0.5)
Eosinophils Relative: 2 %
HCT: 42.5 % (ref 39.0–52.0)
Hemoglobin: 14.7 g/dL (ref 13.0–17.0)
Immature Granulocytes: 0 %
Lymphocytes Relative: 22 %
Lymphs Abs: 2 10*3/uL (ref 0.7–4.0)
MCH: 29.7 pg (ref 26.0–34.0)
MCHC: 34.6 g/dL (ref 30.0–36.0)
MCV: 85.9 fL (ref 80.0–100.0)
Monocytes Absolute: 0.7 10*3/uL (ref 0.1–1.0)
Monocytes Relative: 8 %
Neutro Abs: 6.3 10*3/uL (ref 1.7–7.7)
Neutrophils Relative %: 68 %
Platelets: 214 10*3/uL (ref 150–400)
RBC: 4.95 MIL/uL (ref 4.22–5.81)
RDW: 12.7 % (ref 11.5–15.5)
WBC: 9.3 10*3/uL (ref 4.0–10.5)
nRBC: 0 % (ref 0.0–0.2)

## 2018-12-25 LAB — HIV ANTIBODY (ROUTINE TESTING W REFLEX): HIV Screen 4th Generation wRfx: NONREACTIVE

## 2018-12-25 LAB — COMPREHENSIVE METABOLIC PANEL
ALT: 37 U/L (ref 0–44)
AST: 18 U/L (ref 15–41)
Albumin: 3.5 g/dL (ref 3.5–5.0)
Alkaline Phosphatase: 78 U/L (ref 38–126)
Anion gap: 11 (ref 5–15)
BUN: 20 mg/dL (ref 8–23)
CO2: 29 mmol/L (ref 22–32)
Calcium: 9.2 mg/dL (ref 8.9–10.3)
Chloride: 98 mmol/L (ref 98–111)
Creatinine, Ser: 0.78 mg/dL (ref 0.61–1.24)
GFR calc Af Amer: >60 mL/min (ref >60)
GFR calc non Af Amer: >60 mL/min (ref >60)
Glucose, Bld: 151 mg/dL — ABNORMAL HIGH (ref 70–99)
Potassium: 3.9 mmol/L (ref 3.5–5.1)
Sodium: 138 mmol/L (ref 135–145)
Total Bilirubin: 1.9 mg/dL — ABNORMAL HIGH (ref 0.3–1.2)
Total Protein: 6.4 g/dL — ABNORMAL LOW (ref 6.5–8.1)

## 2018-12-25 LAB — GLUCOSE, CAPILLARY
Glucose-Capillary: 186 mg/dL — ABNORMAL HIGH (ref 70–99)
Glucose-Capillary: 143 mg/dL — ABNORMAL HIGH (ref 70–99)
Glucose-Capillary: 204 mg/dL — ABNORMAL HIGH (ref 70–99)
Glucose-Capillary: 159 mg/dL — ABNORMAL HIGH (ref 70–99)

## 2018-12-25 MED ORDER — SENNOSIDES-DOCUSATE SODIUM 8.6-50 MG PO TABS
2.0000 | ORAL_TABLET | Freq: Two times a day (BID) | ORAL | Status: DC
  Administered 2018-12-25 – 2019-01-14 (×22): 2 via ORAL
  Filled 2018-12-25 (×37): qty 2

## 2018-12-25 MED ORDER — INSULIN GLARGINE 100 UNIT/ML ~~LOC~~ SOLN
35.0000 [IU] | Freq: Every day | SUBCUTANEOUS | Status: DC
  Administered 2018-12-26: 35 [IU] via SUBCUTANEOUS
  Filled 2018-12-25: qty 0.35

## 2018-12-25 NOTE — Evaluation (Signed)
Speech Language Pathology Assessment and Plan  Patient Details  Name: Robert Brewer MRN: [id number] Date of Birth: Oct 31, 1945  SLP Diagnosis: Dysarthria;Cognitive Impairments  Rehab Potential: Good ELOS: 2.5-3 weeks (posssibly shorter for ST)    Today's Date: 12/25/2018 SLP Individual Time: 1305-1400 SLP Individual Time Calculation (min): 55 min   Problem List:  Patient Active Problem List   Diagnosis Date Noted  . Left pontine cerebrovascular accident ([place]) 12/24/2018  . Cerebral thrombosis with cerebral infarction ([facility]) - L paramedian pontnine and L occipital, s/p  tPA   . S/P admn tPA in diff fac w/n last 24 hr bef adm to crnt fac   . Right hemiparesis ([place])   . Dysarthria   . Dysphagia, pharyngoesophageal phase   . Essential hypertension   . Patient non-compliant, refused service   . Non compliance w medication regimen   . Hyperglycemia   . Uncontrolled type 2 diabetes mellitus with hyperglycemia ([place])   . Hyperlipidemia LDL goal <70   . Ischemic cerebrovascular accident (CVA) ([place]) 12/17/2018   Past Medical History: History reviewed. No pertinent past medical history. Past Surgical History: History reviewed. No pertinent surgical history.  Assessment / Plan / Recommendation Clinical Impression Patient is a 73 year old right-handed male with history of diabetes mellitus, hypertension and medical noncompliance. Patient on no prescription medications. Per chart review lives with spouse. Independent prior to admission. 2 level home. [facility]ed to [facility]17[facility]2018 with acute onset of right side weakness, dysarthria and facial droop while mowing the grass. CT of the head at outside hospital showed no hemorrhage. Patient did receive TPA and was transferred to [facility]. CT angiogram of head and neck showed 1 cm hypodensity involving the left paramedian pons and suspicious for evolving acute ischemic infarct. CTA head and neck with no large vessel  occlusion no significant intraluminal thrombus or dissection identified. MRI confirms acute infarct left paramedian pons small acute cortical infarct left occipital lobe. Negative for hemorrhage. Currently maintained on aspirin for CVA prophylaxis. Echocardiogram with ejection fraction of 29% normal systolic function. Negative bubble study. Recommendations hadbeen made for loop recorder placement and patient refused. Hemoglobin A1c 13.3 with insulin therapy as directed. Tolerating a regular consistency diet. Therapy evaluations completed and patient was admitted for a comprehensive rehab program.  Patient transferred to CIR on 12/24/2018 .   Pt presents with mild-moderate flaccid dysarthria at the sentence level, secondary to labial and lingual weakness/ROM. Pt demonstrates moderate awareness of verbal speech errors characterized by misarticulation. Pt would benefit from further evaluation of articulation error in upcoming treatment sessions. Pt presents with [facility] on cognitive linguistic skills supported by subsections from CLQT in the areas of executive function, memory and language. SLP noted impairments in anticipatory awareness, however pt does not oppose as a safety risk.  Pt would benefit from skilled ST services in order to maximize functional independence and reduce burden of care, likely requiring 24 hour supervision and continue ST services.   Skilled Therapeutic Interventions          Skilled ST services focused on speech skills. SLP facilitated recall of speech intelligibility strategies, pt recalled  4/4. SLP facilitated use of speech intelligibility strategies reading at sentence level pt required mod A verbal cues to self-monitor/correct errors for 80% intelligibility. All questions were answered to satisfaction. Pt was left in room with call bell within reach and chair alarm set. ST recommends to continue skilled ST services.   SLP Assessment  Patient will need skilled Speech Lanaguage  Pathology Services during CIR admission    Recommendations  SLP Diet Recommendations: Thin Liquid Administration via: Cup;Straw Medication Administration: Whole meds with liquid Supervision: Patient able to self feed Postural Changes and/or Swallow Maneuvers: Seated upright 90 degrees Oral Care Recommendations: Oral care BID Recommendations for Other Services: Neuropsych consult Patient destination: Home Follow up Recommendations: Home Health SLP Equipment Recommended: None recommended by SLP    SLP Frequency 3 to 5 out of 7 days   SLP Duration  SLP Intensity  SLP Treatment/Interventions 2.5-3 weeks (posssibly shorter for ST)  Minumum of 1-2 x/day, 30 to 90 minutes  Cueing hierarchy;Speech/Language facilitation;Patient/family education    Pain Pain Assessment Pain Score: 0-No pain  Prior Functioning Cognitive/Linguistic Baseline: Within functional limits Type of Home: House  Lives With: Spouse Available Help at Discharge: Family;Available 24 hours/day  Short Term Goals: Week 1: SLP Short Term Goal 1 (Week 1): Pt will utilize speech intelligibility strategies at sentence level with supervision A verbal cues to achieve 80% intelligibilty. SLP Short Term Goal 2 (Week 1): Pt will self-monitor and self-correct verbal speech errors with min A verbal cues.   Refer to Care Plan for Long Term Goals  Recommendations for other services: Neuropsych  Discharge Criteria: Patient will be discharged from SLP if patient refuses treatment 3 consecutive times without medical reason, if treatment goals not met, if there is a change in medical status, if patient makes no progress towards goals or if patient is discharged from hospital.  The above assessment, treatment plan, treatment alternatives and goals were discussed and mutually agreed upon: by patient  Robert Brewer  [facility] 12/25/2018, 3:35 PM

## 2018-12-25 NOTE — Evaluation (Signed)
Occupational Therapy Assessment and Plan  Patient Details  Name: Robert Brewer MRN: [id number] Date of Birth: 06/25/1946  OT Diagnosis: abnormal posture, hemiplegia affecting dominant side and coordination disorder Rehab Potential: Rehab Potential (ACUTE ONLY): Good ELOS: 2.5 - 3 weeks   Today's Date: 12/25/2018 OT Individual Time: 3570-1779 and 1230-1300 OT Individual Time Calculation (min): 60 min and 30 min     Problem List:  Patient Active Problem List   Diagnosis Date Noted  . Left pontine cerebrovascular accident ([place]) 12/24/2018  . Cerebral thrombosis with cerebral infarction (HCC) - L paramedian pontnine and L occipital, s/p  tPA   . S/P admn tPA in diff fac w/n last 24 hr bef adm to crnt fac   . Right hemiparesis ([place])   . Dysarthria   . Dysphagia, pharyngoesophageal phase   . Essential hypertension   . Patient non-compliant, refused service   . Non compliance w medication regimen   . Hyperglycemia   . Uncontrolled type 2 diabetes mellitus with hyperglycemia ([place])   . Hyperlipidemia LDL goal <70   . Ischemic cerebrovascular accident (CVA) ([place]) 12/17/2018    Past Medical History: History reviewed. No pertinent past medical history. Past Surgical History: History reviewed. No pertinent surgical history.  Assessment & Plan Clinical Impression: Patient is a 73 y.o. year old male with history of diabetes mellitus, hypertension and medical noncompliance. Patient on no prescription medications. Per chart review lives with spouse. Independent prior to admission. 2 level home. Presented to [facility]/17/2018 with acute onset of right side weakness, dysarthria and facial droop while mowing the grass. CT of the head at outside hospital showed no hemorrhage. Patient did receive TPA and was transferred to [facility]. CT angiogram of head and neck showed 1 cm hypodensity involving the left paramedian pons and suspicious for evolving acute ischemic infarct.  CTA head and neck with no large vessel occlusion no significant intraluminal thrombus or dissection identified. MRI confirms acute infarct left paramedian pons small acute cortical infarct left occipital lobe. Negative for hemorrhage. Currently maintained on aspirin for CVA prophylaxis. Echocardiogram with ejection fraction of 39% normal systolic function. Negative bubble study. Recommendations hadbeen made for loop recorder placement and patient refused. Hemoglobin A1c 13.3 with insulin therapy as directed. Tolerating a regular consistency diet .  Patient transferred to CIR on 12/24/2018 .    Patient currently requires max with basic self-care skills secondary to muscle weakness, decreased cardiorespiratoy endurance, decreased coordination, decreased attention and decreased sitting balance, decreased standing balance, decreased postural control, hemiplegia and decreased balance strategies.  Prior to hospitalization, patient could complete ADLs and IADLs with modified independent .  Patient will benefit from skilled intervention to decrease level of assist with basic self-care skills prior to discharge home with care partner.  Anticipate patient will require 24 hour supervision and follow up outpatient.  OT - End of Session Activity Tolerance: Decreased this session Endurance Deficit: Yes Endurance Deficit Description: 2/2 generalized deconditioning OT Assessment Rehab Potential (ACUTE ONLY): Good OT Barriers to Discharge: Other (comments) OT Barriers to Discharge Comments: none known at this time OT Patient demonstrates impairments in the following area(s): Balance;Cognition;Endurance;Motor;Pain;Safety OT Basic ADL's Functional Problem(s): Grooming;Bathing;Dressing;Toileting;Eating OT Transfers Functional Problem(s): Toilet;Tub/Shower OT Additional Impairment(s): Fuctional Use of Upper Extremity OT Plan OT Intensity: Minimum of 1-2 x/day, 45 to 90 minutes OT Frequency: 5 out of 7  days OT Duration/Estimated Length of Stay: 2.5 - 3 weeks OT Treatment/Interventions: Neuromuscular re-education;Balance/vestibular training;Self Care/advanced ADL retraining;Therapeutic Exercise;Wheelchair propulsion/positioning;DME/adaptive equipment instruction;Cognitive  remediation/compensation;Pain management;UE/LE Strength taining/ROM;Community reintegration;Functional electrical stimulation;Patient/family education;UE/LE Coordination activities;Discharge planning;Functional mobility training;Therapeutic Activities;Psychosocial support OT Self Feeding Anticipated Outcome(s): set up OT Basic Self-Care Anticipated Outcome(s): supervision OT Toileting Anticipated Outcome(s): supervision OT Bathroom Transfers Anticipated Outcome(s): supervision OT Recommendation Recommendations for Other Services: Therapeutic Recreation consult Patient destination: Home Follow Up Recommendations: Outpatient OT Equipment Recommended: To be determined   Skilled Therapeutic Intervention Session 1: Upon entering the room, pt supine in bed with no c/o pain and agreeable to OT intervention. OT educated pt on OT purpose, POC, and goals with pt verbalizing understanding and agreement. Supine >sit with mod A to EOB. Pt standing with max A and stand pivot transfer with max A and manual facilitation for weight shift and R LE advancement. OT assisted pt into bathroom via wheelchair for max A squat pivot transfer to the R into shower. Pt bathing with hand over hand assistance to incorporate R UE into functional task. Pt needing min A for sitting balance as he leans forward to attempt washing LEs. Pt needing assistance with washing buttocks as well. Pt returning to wheelchair in same manner and dressing while seated in wheelchair with focus on sit <>stand at sink. Pt educated on hemiplegic technique and needing mod A for UB self care and max A for LB self care with max A for standing balance. Pt remaining seated in wheelchair at  end of session with chair alarm donned and activated for safety. Call bell and all needed items within reach upon exiting the room.   Session 2: Upon entering the room, pt seated in wheelchair and finishing lunch with set up A to open containers. Pt requesting to shave at sink with set up A to obtain all needed items but able to adapt and utilizing one handed technique to apply shaving cream and shave face. Pt remained at sink at therapist exited the room as he continues grooming tasks at sink. Call bell within reach and chair alarm remains on.   OT Evaluation Precautions/Restrictions  Precautions Precautions: Fall Restrictions Weight Bearing Restrictions: No General PT Missed Treatment Reason: Not applicable Vital Signs Therapy Vitals Temp: 98 F (36.7 C) Temp Source: Oral Pulse Rate: 70 Resp: 18 BP: 127/73 Oxygen Therapy SpO2: 98 % Pain Pain Assessment Pain Scale: 0-10 Pain Score: 0-No pain Home Living/Prior Functioning Home Living Family/patient expects to be discharged to:: Private residence Living Arrangements: Spouse/significant other Available Help at Discharge: Family, Available 24 hours/day Type of Home: House Home Access: Stairs to enter [employer] of Steps: 1 step to enter at [employer], no rails Entrance Stairs-Rails: None Home Layout: Multi-level, Laundry or work area in basement, Able to live on main level with bedroom/bathroom Bathroom Shower/Tub: Multimedia programmer: Associate Professor Accessibility: Yes  Lives With: Spouse Prior Function Level of Independence: Independent with basic ADLs, Independent with transfers, Independent with gait, Independent with homemaking with ambulation  Able to Take Stairs?: Yes Driving: Yes Vocation: Retired Leisure: Hobbies-no Comments: watching TV, push mowing, has 30 cats Vision Baseline Vision/History: No visual deficits Patient Visual Report: No change from baseline;Other (comment)(Pt reports  diplopia has cleared) Vision Assessment?: No apparent visual deficits Cognition Overall Cognitive Status: Within Functional Limits for tasks assessed Arousal/Alertness: Awake/alert Orientation Level: Person;Place;Situation Person: Oriented Place: Oriented Situation: Oriented Year: 2020 Month: June Day of Week: Correct Memory: Appears intact Immediate Memory Recall: Sock;Blue;Bed Memory Recall: Sock;Blue;Bed Memory Recall Sock: Without Cue Memory Recall Blue: Without Cue Memory Recall Bed: Without Cue Attention: Sustained;Selective Sustained Attention: Appears intact Selective Attention:  Appears intact Awareness: Impaired Awareness Impairment: Emergent impairment;Anticipatory impairment(emergent pretaining to speech errors only) Problem Solving: Appears intact Sequencing: Appears intact Safety/Judgment: Appears intact Sensation Sensation Light Touch: Appears Intact Hot/Cold: Appears Intact Proprioception: Appears Intact Stereognosis: Not tested Coordination Gross Motor Movements are Fluid and Coordinated: No Fine Motor Movements are Fluid and Coordinated: No Coordination and Movement Description: RUE / R LE hemiplegia Motor  Motor Motor: Hemiplegia Motor - Skilled Clinical Observations: generalized deconditioning, R hemiparesis Mobility  Bed Mobility Bed Mobility: Rolling Right;Rolling Left;Sit to Supine;Left Sidelying to Sit Rolling Right: Minimal Assistance - Patient > 75% Rolling Left: Maximal Assistance - Patient 25-49% Left Sidelying to Sit: Maximal Assistance - Patient 25-49% Sit to Supine: Minimal Assistance - Patient > 75% Transfers Sit to Stand: Maximal Assistance - Patient 25-49%  Trunk/Postural Assessment  Cervical Assessment Cervical Assessment: Within Functional Limits Thoracic Assessment Thoracic Assessment: Exceptions to WFL(rounded shoulders) Lumbar Assessment Lumbar Assessment: Exceptions to [facility] Postural Control Postural Control: Deficits on  evaluation Righting Reactions: delayed Protective Responses: delayed  Balance Balance Balance Assessed: Yes Dynamic Standing Balance Dynamic Standing - Balance Support: Left upper extremity supported;During functional activity Dynamic Standing - Level of Assistance: 2: Max assist Dynamic Standing - Balance Activities: (stand pivot transfer) Extremity/Trunk Assessment RUE Assessment RUE Assessment: Exceptions to [facility] Passive Range of Motion (PROM) Comments: [facility] General Strength Comments: able to shrug R shoulder slightly, able to move R thumb, otherwise no active movement noted in RUE LUE Assessment LUE Assessment: Within Functional Limits     Refer to Care Plan for Long Term Goals  Recommendations for other services: Therapeutic Recreation  Stress management and Outing/community reintegration   Discharge Criteria: Patient will be discharged from OT if patient refuses treatment 3 consecutive times without medical reason, if treatment goals not met, if there is a change in medical status, if patient makes no progress towards goals or if patient is discharged from hospital.  The above assessment, treatment plan, treatment alternatives and goals were discussed and mutually agreed upon: by patient  [name] 12/25/2018, 4:19 PM

## 2018-12-25 NOTE — H&P (Addendum)
Physical Medicine and Rehabilitation Admission H&P     Chief complaint: Right side weakness HPI: Robert Brewer is a 73 year old right-handed male with history of diabetes mellitus, hypertension and medical noncompliance.  Patient on no prescription medications.  Per chart review lives with spouse.  Independent prior to admission.  2 level home.  Presented to [facility] 12/17/2018 with acute onset of right side weakness, dysarthria and facial droop while mowing the grass.  CT of the head at outside hospital showed no hemorrhage.  Patient did receive TPA and was transferred to [facility].  CT angiogram of head and neck showed 1 cm hypodensity involving the left paramedian pons and suspicious for evolving acute ischemic infarct.  CTA head and neck with no large vessel occlusion no significant intraluminal thrombus or dissection identified.  MRI confirms acute infarct left paramedian pons small acute cortical infarct left occipital lobe.  Negative for hemorrhage.  Currently maintained on aspirin for CVA prophylaxis.  Echocardiogram with ejection fraction of 03% normal systolic function.  Negative bubble study.  Recommendations had been made for loop recorder placement and patient refused.  Hemoglobin A1c 13.3 with insulin therapy as directed.  Tolerating a regular consistency diet.  Therapy evaluations completed and patient was admitted for a comprehensive rehab program   Review of Systems  Constitutional: Negative for chills and fever.  HENT: Negative for hearing loss.   Eyes: Negative for blurred vision and double vision.  Respiratory: Negative for shortness of breath.   Cardiovascular: Negative for palpitations and leg swelling.  Gastrointestinal: Positive for blood in stool. Negative for heartburn and nausea.  Genitourinary: Negative for dysuria, flank pain and hematuria.  Musculoskeletal: Positive for joint pain and myalgias.  Skin: Negative for rash.  Neurological: Positive  for speech change and weakness.       Occasional headache  All other systems reviewed and are negative.   History reviewed. No pertinent past medical history. History reviewed. No pertinent surgical history. Family History  Problem Relation Age of Onset  . Other Mother          healthy, lived to her 53's  . Prostate cancer Father          lived to his 16's  . Congestive Heart Failure Father    . Valvular heart disease Sister    . Prostate cancer Brother      Social History:  has no history on file for tobacco, alcohol, and drug. Allergies: Not on File No medications prior to admission.      Drug Regimen Review Drug regimen was reviewed and remains appropriate with no significant issues identified   Home: Home Living Family/patient expects to be discharged to:: Private residence Living Arrangements: Spouse/significant other Available Help at Discharge: Family, Available 24 hours/day Type of Home: House Home Access: Stairs to enter [employer] of Steps: 1 step to back porch no railing Home Layout: Two level Bathroom Shower/Tub: Multimedia programmer: Standard Bathroom Accessibility: Yes Home Equipment: Radio producer - single point  Lives With: Spouse   Functional History: Prior Function Level of Independence: Independent Comments: Patient reports he has a cane at home that his wife used sometimes but he dosent. He sleeps in a recliner but only because he stays up late watching TV.    Functional Status:  Mobility: Bed Mobility Overal bed mobility: Needs Assistance Bed Mobility: Supine to Sit Supine to sit: Min assist General bed mobility comments: min A with head of bed elevated, assist  to scoot to edge of bed for Rt hip Transfers Overall transfer level: Needs assistance Equipment used: Hemi-walker Transfers: Sit to/from Stand, Stand Pivot Transfers Sit to Stand: Mod assist Stand pivot transfers: Mod assist General transfer comment: sit <> stand  3 x 5  without UE assist with focus on midline orientation and Rt LE strengthening with mod A Ambulation/Gait Ambulation/Gait assistance: Mod assist Gait Distance (Feet): 6 Feet Assistive device: Hemi-walker Gait Pattern/deviations: Decreased weight shift to left, Ataxic, Narrow base of support, Decreased stride length General Gait Details: pt able to advance Rt LE with min facilitation, manual facilitation for motor planning, wt shifts and coordination Gait velocity: decreased  Gait velocity interpretation: <1.31 ft/sec, indicative of household ambulator Stairs: Yes   ADL: ADL Overall ADL's : Needs assistance/impaired Eating/Feeding: Set up Eating/Feeding Details (indicate cue type and reason): sitting supported in chair. If sitting EOB would need close supervision to occassional min a for balance.  Grooming: Wash/dry hands, Wash/dry face, Oral care, Minimal assistance, Set up, Sitting Grooming Details (indicate cue type and reason): in supported chair Upper Body Bathing: Minimal assistance, Sitting Upper Body Bathing Details (indicate cue type and reason): Assistance to pick up right UE.  Lower Body Bathing: Maximal assistance, Sit to/from stand Upper Body Dressing : Moderate assistance, Sitting Lower Body Dressing: Maximal assistance, Sit to/from stand Toilet Transfer: Maximal assistance, Cueing for sequencing, Cueing for safety Toilet Transfer Details (indicate cue type and reason): Simulated to recliner with PT completing. Transferred from bed to recliner going towards the left. See PT note. Toileting- Clothing Manipulation and Hygiene: Maximal assistance Functional mobility during ADLs: +2 for physical assistance, Moderate assistance General ADL Comments: R bias with decreased RLE control. Pt able to weight shift to midline and then to L with cues but unable to maintain.  Pt aware he was losing balance to the left however no attempts to remediate.    Cognition: Cognition Overall  Cognitive Status: Within Functional Limits for tasks assessed Arousal/Alertness: Awake/alert Orientation Level: Oriented X4 Attention: Focused, Sustained Focused Attention: Appears intact Sustained Attention: Appears intact Memory: Appears intact(Immediate: 3/3; Delayed: 3/3) Awareness: Appears intact Problem Solving: Appears intact(5/5) Executive Function: Sequencing Sequencing: Appears intact Cognition Arousal/Alertness: Awake/alert Behavior During Therapy: WFL for tasks assessed/performed Overall Cognitive Status: Within Functional Limits for tasks assessed General Comments: Patient les s emotioanl today   Physical Exam: Blood pressure 130/79, pulse (!) 57, temperature 98.4 F (36.9 C), temperature source Oral, resp. rate 16, height 5\' 11"  (1.803 m), weight 96.4 kg, SpO2 95 %. Physical Exam  Constitutional: He appears well-developed. No distress.  HENT:  Head: Normocephalic and atraumatic.  Eyes: Pupils are equal, round, and reactive to light. EOM are normal.  Neck: Normal range of motion. No thyromegaly present.  Cardiovascular: Normal rate. Exam reveals no friction rub.  Respiratory: Effort normal. No respiratory distress.  GI: Soft. He exhibits no distension. There is no abdominal tenderness.  Neurological: He is alert.  Patient is alert.  Dysarthric but intelligible.  Follows commands.  Fair awareness of deficits.  Right central 7 and tongue deviation. RUE 0/5 prox to distal. RLE 3-/5 prox to tr-0/5 ADF/PF. LUE and LLE 4-5/5. No sensory deficits.       Lab Results Last 48 Hours       Results for orders placed or performed during the hospital encounter of 12/17/18 (from the past 48 hour(s))  Glucose, capillary     Status: Abnormal    Collection Time: 12/22/18  4:32 PM  Result Value Ref  Range    Glucose-Capillary 289 (H) 70 - 99 mg/dL    Comment 1 Notify RN      Comment 2 Document in Chart    Glucose, capillary     Status: Abnormal    Collection Time: 12/22/18  8:57 PM   Result Value Ref Range    Glucose-Capillary 235 (H) 70 - 99 mg/dL  Glucose, capillary     Status: Abnormal    Collection Time: 12/23/18  6:07 AM  Result Value Ref Range    Glucose-Capillary 222 (H) 70 - 99 mg/dL    Comment 1 Notify RN      Comment 2 Document in Chart    Glucose, capillary     Status: Abnormal    Collection Time: 12/23/18  8:05 AM  Result Value Ref Range    Glucose-Capillary 224 (H) 70 - 99 mg/dL  Glucose, capillary     Status: Abnormal    Collection Time: 12/23/18 11:10 AM  Result Value Ref Range    Glucose-Capillary 247 (H) 70 - 99 mg/dL    Comment 1 Notify RN      Comment 2 Document in Chart    Glucose, capillary     Status: Abnormal    Collection Time: 12/23/18  4:10 PM  Result Value Ref Range    Glucose-Capillary 274 (H) 70 - 99 mg/dL    Comment 1 Notify RN      Comment 2 Document in Chart    Glucose, capillary     Status: Abnormal    Collection Time: 12/23/18  9:26 PM  Result Value Ref Range    Glucose-Capillary 195 (H) 70 - 99 mg/dL    Comment 1 Notify RN      Comment 2 Document in Chart    Glucose, capillary     Status: Abnormal    Collection Time: 12/24/18  6:15 AM  Result Value Ref Range    Glucose-Capillary 232 (H) 70 - 99 mg/dL    Comment 1 Notify RN      Comment 2 Document in Chart    Glucose, capillary     Status: Abnormal    Collection Time: 12/24/18 11:48 AM  Result Value Ref Range    Glucose-Capillary 228 (H) 70 - 99 mg/dL    Comment 1 Notify RN      Comment 2 Document in Chart        Imaging Results (Last 48 hours)  No results found.           Medical Problem List and Plan: 1.  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  Status post TPA.  Patient refused loop recorder             -admit to inpatient rehab 2.  Antithrombotics: -DVT/anticoagulation: SCDs             -antiplatelet therapy: Aspirin 81 mg daily 3. Pain Management: Tylenol  as needed 4. Mood: Provide emotional support             -antipsychotic agents: N/A 5. Neuropsych: This patient is capable of making decisions on his own behalf. 6. Skin/Wound Care: Routine skin checks 7. Fluids/Electrolytes/Nutrition: Routine in and outs with follow-up chemistries             -encourage PO 8.  Diabetes mellitus.  Hemoglobin A1c 13.3. Novolog 7 units TID, Lantus insulin 30 units daily.               -  check cbg's ac/hs,              -diabetic education 9.  Permissive Hypertension.  Monitor with increased mobility.  Patient on no prescription medications prior to admission 10.  Medical noncompliance.  Counseling   Post Admission Physician Evaluation: 1. Functional deficits secondary  to left pontine infarct. 2. Patient is admitted to receive collaborative, interdisciplinary care between the physiatrist, rehab nursing staff, and therapy team. 3. Patient's level of medical complexity and substantial therapy needs in context of that medical necessity cannot be provided at a lesser intensity of care such as a SNF. 4. Patient has experienced substantial functional loss from his/her baseline which was documented above under the "Functional History" and "Functional Status" headings.  Judging by the patient's diagnosis, physical exam, and functional history, the patient has potential for functional progress which will result in measurable gains while on inpatient rehab.  These gains will be of substantial and practical use upon discharge  in facilitating mobility and self-care at the household level. 5. Physiatrist will provide 24 hour management of medical needs as well as oversight of the therapy plan/treatment and provide guidance as appropriate regarding the interaction of the two. 6. The Preadmission Screening has been reviewed and patient status is unchanged unless otherwise stated above. 7. 24 hour rehab nursing will assist with bladder management, bowel management, safety,  skin/wound care, disease management, medication administration and patient education  and help integrate therapy concepts, techniques,education, etc. 8. PT will assess and treat for/with: Lower extremity strength, range of motion, stamina, balance, functional mobility, safety, adaptive techniques and equipment, NMR.   Goals are: supervision to min assist. 9. OT will assess and treat for/with: ADL's, functional mobility, safety, upper extremity strength, adaptive techniques and equipment, NMR.   Goals are: supervision to min assist. Therapy may proceed with showering this patient. 10. SLP will assess and treat for/with: speech, swallowing, communication.  Goals are: supervision. 11. Case Management and Social Worker will assess and treat for psychological issues and discharge planning. 12. Team conference will be held weekly to assess progress toward goals and to determine barriers to discharge. 13. Patient will receive at least 3 hours of therapy per day at least 5 days per week. 14. ELOS: 16-20 days       15. Prognosis:  excellent   I have personally performed a face to face diagnostic evaluation of this patient and formulated the key components of the plan.  Additionally, I have personally reviewed laboratory data, imaging studies, as well as relevant notes and concur with the physician assistant's documentation above.  Meredith Staggers, MD, [facility]     Robert Paganini [place], PA-C 12/24/2018

## 2018-12-25 NOTE — Progress Notes (Addendum)
[place] PHYSICAL MEDICINE & REHABILITATION PROGRESS NOTE   Subjective/Complaints:  Patient had bowel movement after suppository.  Has only had 1 or 2 bowel movements over the last week.  Denies any problems with his bladder  Review of systems denies chest pain shortness of breath, nausea vomiting diarrhea, positive for constipation as noted, negative for abdominal pain  Objective:   No results found. Recent Labs    12/25/18 0712  WBC 9.3  HGB 14.7  HCT 42.5  PLT 214   Recent Labs    12/25/18 0712  NA 138  K 3.9  CL 98  CO2 29  GLUCOSE 151*  BUN 20  CREATININE 0.78  CALCIUM 9.2    Intake/Output Summary (Last 24 hours) at 12/25/2018 0852 Last data filed at 12/25/2018 0800 Gross per 24 hour  Intake 720 ml  Output 1100 ml  Net -380 ml     Physical Exam: Vital Signs Blood pressure (!) 141/80, pulse (!) 50, temperature 98.5 F (36.9 C), temperature source Oral, resp. rate 16, height 5\' 11"  (1.803 m), weight 97.3 kg, SpO2 94 %.   General: No acute distress Mood and affect are appropriate Heart: Regular rate and rhythm no rubs murmurs or extra sounds Lungs: Clear to auscultation, breathing unlabored, no rales or wheezes Abdomen: Positive bowel sounds, soft nontender to palpation, nondistended Extremities: No clubbing, cyanosis, or edema Skin: No evidence of breakdown, no evidence of rash Neurologic: Cranial nerves II through XII intact, motor strength is 5/5 in LEFT  deltoid, bicep, tricep, grip, hip flexor, knee extensors, ankle dorsiflexor and plantar flexor 0/5 RUE, 3- Left hip add trace hip/knee ext synergy Sensory exam normal sensation to light touch and proprioception in bilateral upper and lower extremities Cerebellar exam normal finger to nose to finger as well as heel to shin in bilateral upper and lower extremities Musculoskeletal: Full range of motion in all 4 extremities. No joint swelling   Assessment/Plan: 1. Functional deficits secondary to Left  paramedian pontine infarct which require 3+ hours per day of interdisciplinary therapy in a comprehensive inpatient rehab setting.  Physiatrist is providing close team supervision and 24 hour management of active medical problems listed below.  Physiatrist and rehab team continue to assess barriers to discharge/monitor patient progress toward functional and medical goals  Care Tool:  Bathing              Bathing assist       Upper Body Dressing/Undressing Upper body dressing   What is the patient wearing?: Hospital gown only    Upper body assist Assist Level: Minimal Assistance - Patient > 75%    Lower Body Dressing/Undressing Lower body dressing      What is the patient wearing?: Underwear/pull up     Lower body assist Assist for lower body dressing: Moderate Assistance - Patient 50 - 74%     Toileting Toileting    Toileting assist Assist for toileting: Independent     Transfers Chair/bed transfer  Transfers assist  Chair/bed transfer activity did not occur: Safety/medical concerns        Locomotion Ambulation   Ambulation assist              Walk 10 feet activity   Assist           Walk 50 feet activity   Assist           Walk 150 feet activity   Assist           Walk  10 feet on uneven surface  activity   Assist           Wheelchair     Assist               Wheelchair 50 feet with 2 turns activity    Assist            Wheelchair 150 feet activity     Assist          Medical Problem List and Plan: 1.Right side weakness with facial droop and dysarthriasecondary to 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 Status post TPA. Patient refused loop recorder CIR evaluations today, PT OT speech, team conference in a.m. 2. Antithrombotics: -DVT/anticoagulation:SCDs -antiplatelet therapy: Aspirin 81 mg daily 3. Pain  Management:Tylenol as needed 4. Mood:Provide emotional support -antipsychotic agents: N/A 5. Neuropsych: This patientiscapable of making decisions on hisown behalf. 6. Skin/Wound Care:Routine skin checks 7. Fluids/Electrolytes/Nutrition:Routine in and outs with follow-up chemistries -encourage PO 8.Diabetes mellitus. Hemoglobin A1c 13.3. Novolog7units TID,Lantus insulin 30units daily.  CBG (last 3)  Recent Labs    12/24/18 1549 12/24/18 2130 12/25/18 0618  GLUCAP 200* 99 186*  increase to 35U goal is was 180 greater than 100 -diabetic education 9. Permissive Hypertension. Monitor with increased mobility. Patient on no prescription medications prior to admission Vitals:   12/24/18 1947 12/25/18 0443  BP: 131/78 (!) 141/80  Pulse: 60 (!) 50  Resp: 16 16  Temp: 98.3 F (36.8 C) 98.5 F (36.9 C)  SpO2: 95% 94%   10.Medical noncompliance. Counseling    LOS: 1 days A FACE TO FACE EVALUATION WAS PERFORMED  [name] 12/25/2018, 8:52 AM

## 2018-12-25 NOTE — Plan of Care (Signed)
  Problem: RH SKIN INTEGRITY Goal: RH STG SKIN FREE OF INFECTION/BREAKDOWN Description Maintain skin integrity with minimal assist.   Outcome: Progressing Goal: RH STG MAINTAIN SKIN INTEGRITY WITH ASSISTANCE Description STG Maintain Skin Integrity With minimal  Assistance.  Outcome: Progressing   Problem: RH SAFETY Goal: RH STG ADHERE TO SAFETY PRECAUTIONS W/ASSISTANCE/DEVICE Description STG Adhere to Safety Precautions With minimal Assistance/Device.  Outcome: Progressing Goal: RH STG DECREASED RISK OF FALL WITH ASSISTANCE Description STG Decreased Risk of Fall With minimal Assistance.  Outcome: Progressing   Problem: RH KNOWLEDGE DEFICIT Goal: RH STG INCREASE KNOWLEDGE OF DIABETES Outcome: Progressing Goal: RH STG INCREASE KNOWLEDGE OF STROKE PROPHYLAXIS Outcome: Progressing

## 2018-12-25 NOTE — Progress Notes (Signed)
Inpatient Rehabilitation  Patient information reviewed and entered into eRehab system by Olla Delancey M. Elan Brainerd, M.A., CCC/SLP, PPS Coordinator.  Information including medical coding, functional ability and quality indicators will be reviewed and updated through discharge.    

## 2018-12-25 NOTE — Evaluation (Signed)
Physical Therapy Assessment and Plan  Patient Details  Name: Robert Brewer MRN: [id number] Date of Birth: August 07, 1945  PT Diagnosis: Abnormality of gait, Coordination disorder, Difficulty walking, Hemipparesis dominant and Muscle weakness Rehab Potential: Good ELOS: 2.5-3 weeks   Today's Date: 12/25/2018 PT Individual Time: 1000-1055 PT Individual Time Calculation (min): 55 min    Problem List:  Patient Active Problem List   Diagnosis Date Noted  . Left pontine cerebrovascular accident ([place]) 12/24/2018  . Cerebral thrombosis with cerebral infarction (HCC) - L paramedian pontnine and L occipital, s/p  tPA   . S/P admn tPA in diff fac w/n last 24 hr bef adm to crnt fac   . Right hemiparesis ([place])   . Dysarthria   . Dysphagia, pharyngoesophageal phase   . Essential hypertension   . Patient non-compliant, refused service   . Non compliance w medication regimen   . Hyperglycemia   . Uncontrolled type 2 diabetes mellitus with hyperglycemia ([place])   . Hyperlipidemia LDL goal <70   . Ischemic cerebrovascular accident (CVA) ([place]) 12/17/2018    Past Medical History: History reviewed. No pertinent past medical history. Past Surgical History: History reviewed. No pertinent surgical history.  Assessment & Plan Clinical Impression: Patient is a 73 year old right-handed male with history of diabetes mellitus, hypertension and medical noncompliance. Patient on no prescription medications. Per chart review lives with spouse. Independent prior to admission. 2 level home. Presented to [facility] 12/17/2018 with acute onset of right side weakness, dysarthria and facial droop while mowing the grass. CT of the head at outside hospital showed no hemorrhage. Patient did receive TPA and was transferred to [facility]. CT angiogram of head and neck showed 1 cm hypodensity involving the left paramedian pons and suspicious for evolving acute ischemic infarct. CTA head and neck with no  large vessel occlusion no significant intraluminal thrombus or dissection identified. MRI confirms acute infarct left paramedian pons small acute cortical infarct left occipital lobe. Negative for hemorrhage. Currently maintained on aspirin for CVA prophylaxis. Echocardiogram with ejection fraction of 16% normal systolic function. Negative bubble study. Recommendations hadbeen made for loop recorder placement and patient refused. Hemoglobin A1c 13.3 with insulin therapy as directed. Tolerating a regular consistency diet. Therapy evaluations completed and patient was admitted for a comprehensive rehab program.  Patient transferred to CIR on 12/24/2018 .   Patient currently requires max with mobility secondary to muscle weakness, decreased cardiorespiratoy endurance, decreased coordination, and decreased sitting balance, decreased standing balance, decreased postural control and decreased balance strategies.  Prior to hospitalization, patient was independent  with mobility and lived with Spouse(wife Rose) in a House home.  Home access is 1 step to enter at kitchen, no railsStairs to enter.  Patient will benefit from skilled PT intervention to maximize safe functional mobility, minimize fall risk and decrease caregiver burden for planned discharge home with 24 hour supervision.  Anticipate patient will benefit from follow up [facility] at discharge.  PT - End of Session Activity Tolerance: Tolerates 30+ min activity with multiple rests Endurance Deficit: Yes Endurance Deficit Description: 2/2 generalized deconditioning PT Assessment Rehab Potential (ACUTE/IP ONLY): Good PT Barriers to Discharge: Decreased caregiver support PT Barriers to Discharge Comments: unsure if wife can provide physical assist at d/c PT Patient demonstrates impairments in the following area(s): Balance;Endurance;Safety;Motor PT Transfers Functional Problem(s): Bed to Chair;Car;Furniture;Bed Mobility PT Locomotion Functional  Problem(s): Ambulation;Wheelchair Mobility;Stairs PT Plan PT Intensity: Minimum of 1-2 x/day ,45 to 90 minutes PT Frequency: 5 out of 7 days  PT Duration Estimated Length of Stay: 2.5-3 weeks PT Treatment/Interventions: Cognitive remediation/compensation;Ambulation/gait training;Discharge planning;DME/adaptive equipment instruction;Functional mobility training;Pain management;Psychosocial support;Splinting/orthotics;Therapeutic Activities;UE/LE Strength taining/ROM;Visual/perceptual remediation/compensation;Balance/vestibular training;Community reintegration;Functional electrical stimulation;Disease management/prevention;Neuromuscular re-education;Patient/family education;Stair training;Therapeutic Exercise;Skin care/wound management;UE/LE Coordination activities;Wheelchair propulsion/positioning PT Transfers Anticipated Outcome(s): supervision with LRAD PT Locomotion Anticipated Outcome(s): supervision with LRAD PT Recommendation Recommendations for Other Services: Therapeutic Recreation consult;Neuropsych consult Therapeutic Recreation Interventions: Stress management Follow Up Recommendations: Home health PT;24 hour supervision/assistance Patient destination: Home Equipment Recommended: To be determined  Skilled Therapeutic Intervention Patient received in w/c & agreeable to tx. PT evaluation initiated & therapist educated on ELOS, weekly team meetings, daily therapy schedule, safety plan (call for assistance when wanting to transfer OOB or w/c), and other CIR information. Provided pt with cushion to prevent skin breakdown when OOB. Pt completes car transfer at small SUV simulated height with mod assist, bed mobility with up to max assist as noted below, gait at rail x 30 ft with max assist + w/c follow for safety with good upright posture, pt able to initiate stepping forwards with RLE but requires assistance for placement and blocking R knee to prevent A/P instability, and bed<>w/c transfers with  max assist. Pt is able to propel w/c around unit with L hemi technique and supervision with instructional cuing for technique. At end of session pt left sitting in w/c with chair alarm donned & all needs at hand.  PT Evaluation Precautions/Restrictions Precautions Precautions: Fall Restrictions Weight Bearing Restrictions: No  General Chart Reviewed: Yes Additional Pertinent History: DM (per pt) PT Missed Treatment Reason: Not applicable Response to Previous Treatment: Patient with no complaints from previous session. Family/Caregiver Present: No   Home Living/Prior Functioning Home Living Available Help at Discharge: Family;Available 24 hours/day Type of Home: House Home Access: Stairs to enter [employer] of Steps: 1 step to enter at kitchen, no rails Entrance Stairs-Rails: None Home Layout: Multi-level;Laundry or work area in basement;Able to live on main level with bedroom/bathroom  Lives With: Spouse(wife Rose) Prior Function Level of Independence: Independent with basic ADLs;Independent with transfers;Independent with gait;Independent with homemaking with ambulation  Able to Take Stairs?: Yes Driving: Yes Vocation: Retired Leisure: Hobbies-no Comments: watching TV, push mowing, has 30 cats  Vision/Perception  Pt reports he uses glasses to use phone/computer at baseline. Pt reports vision is a little more blurry following medical event. Pt reports diplopia following medical event but states this resolved after a few days.     Cognition Overall Cognitive Status: Within Functional Limits for tasks assessed Arousal/Alertness: Awake/alert Orientation Level: Oriented X4 Sustained Attention: Appears intact Memory: Appears intact Awareness: Appears intact Sequencing: Appears intact Safety/Judgment: Appears intact  Sensation Sensation Light Touch: Appears Intact(BLE) Proprioception: Appears Intact(BLE) Coordination Gross Motor Movements are Fluid and  Coordinated: No(impaired RUE/RLE 2/2 hemiparesis) Fine Motor Movements are Fluid and Coordinated: No(impaired RUE/RLE 2/2 hemiparesis) Coordination and Movement Description: RLE hemiparesis  Motor  Motor Motor - Skilled Clinical Observations: generalized deconditioning, R hemiparesis   Mobility Bed Mobility Bed Mobility: Rolling Right;Rolling Left;Sit to Supine;Left Sidelying to Sit Rolling Right: Minimal Assistance - Patient > 75% Rolling Left: Maximal Assistance - Patient 25-49% Left Sidelying to Sit: Maximal Assistance - Patient 25-49% Sit to Supine: Minimal Assistance - Patient > 75% Transfers Transfers: Sit to Stand;Stand to Sit;Stand Pivot Transfers Sit to Stand: Maximal Assistance - Patient 25-49% Stand Pivot Transfers: Maximal Assistance - Patient 25 - 49% Stand Pivot Transfer Details: Verbal cues for technique;Verbal cues for sequencing;Verbal cues for precautions/safety;Manual facilitation for placement;Manual facilitation for weight  shifting   Locomotion  Gait Ambulation: Yes Gait Assistance: 2 Helpers(max assist + w/c follow for safety) Gait Distance (Feet): 30 Feet Assistive device: (rail in hallway) Gait Assistance Details: Tactile cues for weight shifting;Tactile cues for placement;Verbal cues for sequencing;Manual facilitation for placement Gait Assistance Details: assistance with placement of RLE 2/2 impaired coordination & neuromuscular control Gait Gait: Yes Gait Pattern: Decreased dorsiflexion - right;Decreased hip/knee flexion - right;Decreased stance time - right;Decreased stride length;Decreased step length - right Gait velocity: decreased  Stairs / Additional Locomotion Stairs: No Wheelchair Mobility Wheelchair Mobility: Yes Wheelchair Assistance: Supervision/Verbal cueing Wheelchair Propulsion: Left upper extremity;Left lower extremity Wheelchair Parts Management: Needs assistance Distance: 150 ft    Trunk/Postural Assessment  Cervical  Assessment Cervical Assessment: Within Functional Limits Thoracic Assessment Thoracic Assessment: Exceptions to WFL(slightly rounded shoulders) Postural Control Postural Control: Deficits on evaluation Righting Reactions: delayed Protective Responses: delayed   Balance Balance Balance Assessed: Yes Dynamic Standing Balance Dynamic Standing - Balance Support: Left upper extremity supported;During functional activity Dynamic Standing - Level of Assistance: 2: Max assist Dynamic Standing - Balance Activities: (stand pivot transfer)   Extremity Assessment  RUE Assessment RUE Assessment: Exceptions to [facility] Passive Range of Motion (PROM) Comments: [facility] General Strength Comments: able to shrug R shoulder slightly, able to move R thumb, otherwise no active movement noted in RUE LUE Assessment LUE Assessment: Within Functional Limits  RLE Assessment RLE Assessment: Exceptions to [facility] Passive Range of Motion (PROM) Comments: WFL RLE Strength Right Hip Flexion: 2/5 Right Knee Flexion: 2+/5 Right Knee Extension: 2+/5 Right Ankle Dorsiflexion: 0/5 LLE Assessment LLE Assessment: Within Functional Limits    Refer to Care Plan for Long Term Goals  Recommendations for other services: Neuropsych and Therapeutic Recreation  Stress management  Discharge Criteria: Patient will be discharged from PT if patient refuses treatment 3 consecutive times without medical reason, if treatment goals not met, if there is a change in medical status, if patient makes no progress towards goals or if patient is discharged from hospital.  The above assessment, treatment plan, treatment alternatives and goals were discussed and mutually agreed upon: by patient  [name] 12/25/2018, 1:24 PM

## 2018-12-26 ENCOUNTER — Inpatient Hospital Stay (HOSPITAL_COMMUNITY): Payer: Medicare HMO | Admitting: Occupational Therapy

## 2018-12-26 ENCOUNTER — Inpatient Hospital Stay (HOSPITAL_COMMUNITY): Payer: Medicare HMO | Admitting: Physical Therapy

## 2018-12-26 ENCOUNTER — Inpatient Hospital Stay (HOSPITAL_COMMUNITY): Payer: Medicare HMO

## 2018-12-26 LAB — GLUCOSE, CAPILLARY
Glucose-Capillary: 181 mg/dL — ABNORMAL HIGH (ref 70–99)
Glucose-Capillary: 164 mg/dL — ABNORMAL HIGH (ref 70–99)
Glucose-Capillary: 139 mg/dL — ABNORMAL HIGH (ref 70–99)
Glucose-Capillary: 85 mg/dL (ref 70–99)

## 2018-12-26 MED ORDER — INSULIN GLARGINE 100 UNIT/ML ~~LOC~~ SOLN
38.0000 [IU] | Freq: Every day | SUBCUTANEOUS | Status: DC
  Administered 2018-12-27 – 2018-12-29 (×3): 38 [IU] via SUBCUTANEOUS
  Filled 2018-12-26 (×3): qty 0.38

## 2018-12-26 NOTE — Plan of Care (Signed)
  Problem: RH SKIN INTEGRITY Goal: RH STG SKIN FREE OF INFECTION/BREAKDOWN Description Maintain skin integrity with minimal assist.   12/26/2018 1341 by Evalyn Casco, RN Outcome: Progressing 12/26/2018 1341 by Evalyn Casco, RN Outcome: Progressing   Problem: RH SAFETY Goal: RH STG ADHERE TO SAFETY PRECAUTIONS W/ASSISTANCE/DEVICE Description STG Adhere to Safety Precautions With minimal Assistance/Device.  Outcome: Progressing Goal: RH STG DECREASED RISK OF FALL WITH ASSISTANCE Description STG Decreased Risk of Fall With minimal Assistance.  Outcome: Progressing

## 2018-12-26 NOTE — Progress Notes (Signed)
Speech Language Pathology Daily Session Note  Patient Details  Name: Robert Brewer MRN: [id number] Date of Birth: 1946-07-09  Today's Date: 12/26/2018 SLP Individual Time: 0910-1000 SLP Individual Time Calculation (min): 50 min  Short Term Goals: Week 1: SLP Short Term Goal 1 (Week 1): Pt will utilize speech intelligibility strategies at sentence level with supervision A verbal cues to achieve 80% intelligibilty. SLP Short Term Goal 2 (Week 1): Pt will self-monitor and self-correct verbal speech errors with min A verbal cues.   Skilled Therapeutic Interventions: Skilled ST services focused on speech skills. SLP facilitated further investigation of dysarthric speech sound errors given Frenchay. SLP noted speech errors pertaining to medial /r/, medial /l/, consonant blends and multisyllables. Pt demonstrated 76% reading at word level and 86% reading at sentence level for speech intelligibility. SLP facilitated use of speech intelligibility strategies at sentence level given picture description task, pt required supervision A verbal cues for error awareness and self correction. SLP changed pt to x1-3 week due to progress. Pt was left in room with call bell within reach and bed alarm set. ST recommends to continue skilled ST services.      Pain Pain Assessment Pain Score: 0-No pain  Therapy/Group: Individual Therapy  Destyn Schuyler  [facility] 12/26/2018, 4:19 PM

## 2018-12-26 NOTE — Progress Notes (Signed)
Physical Therapy Session Note  Patient Details  Name: Robert Brewer MRN: [id number] Date of Birth: 03-18-1946  Today's Date: 12/26/2018 PT Individual Time: 1100-1200 PT Individual Time Calculation (min): 60 min   Short Term Goals: Week 1:  PT Short Term Goal 1 (Week 1): Pt will complete all bed mobility with mod assist without hospital bed features. PT Short Term Goal 2 (Week 1): Pt will ambulate 50 ft with LRAD & mod assist. PT Short Term Goal 3 (Week 1): Pt will negotiate 8 steps with B rails & mod assist. PT Short Term Goal 4 (Week 1): Pt will consistently complete bed<>w/c transfers with min assist.  Skilled Therapeutic Interventions/Progress Updates: Pt presented in bed agreeable to therapy, pt denies pain unless performing heel slide. Pt performed supine to sit with modA and heavy use of bed features. Pt was able to maintain sitting balance while PTA donned shoes total A for time management. Pt performed squat pivot to L modA to w/c. Pt propelled w/c via L hemi technique to rehab gym with intermittnet cues for maintaining straight trajectory. Performed squat pivot to R modA to mat. Pt participated in static standing activities for standing tolerance and increased wt bearing through RLE. Pt required min/modA for STS throughout session with RW with heavy verbal cues for increasing forefoot wt bearing through L foot as pt tended to lift toes. Pt participated in horseshoes reaching for objects with LUE and crossing midline. Pt noted to favor L lean in standing thus PTA provided mirror for visual feedback which once done pt with good carryover for improved orientation.  Pt also performed toe taps with LLE with PTA blocking R knee due to instability 2 x 5. Pt also performed placing horseshoes on basketball hoop forcing lean to R using LUE with improved R knee stability with activity. Pt required intermittent seated rest breaks due to fatigue throughout standing activities. Performed squat pivot to L  maxA to return to w/c and pt transported to day room. Pt participated in Cybex Kinetron w/c level 60cm/sec with increased DF x 3 min and foot flat x 3 min for reciprocal activity and BLE strengthening. Pt propelled back to room supervision level and remained in w/c at end of session. Pt left with seat alarm on, call bell within reach and needs met.      Therapy Documentation Precautions:  Precautions Precautions: Fall Restrictions Weight Bearing Restrictions: No    Therapy/Group: Individual Therapy  Rocko Fesperman  Chynna Buerkle, PTA  12/26/2018, 12:17 PM

## 2018-12-26 NOTE — Progress Notes (Signed)
Occupational Therapy Session Note  Patient Details  Name: Robert Brewer MRN: [id number] Date of Birth: 1945-10-08  Today's Date: 12/26/2018 OT Individual Time: 1300-1410 OT Individual Time Calculation (min): 70 min    Short Term Goals: Week 1:  OT Short Term Goal 1 (Week 1): Pt will perform UB dressing with min A to decrease level of assist with self care. OT Short Term Goal 2 (Week 1): Pt will perform toilet transfer with mod A.  OT Short Term Goal 3 (Week 1): Pt will perform toileting with mod A.  OT Short Term Goal 4 (Week 1): Pt will perform LB clothing management with mod A for standing balance.   Skilled Therapeutic Interventions/Progress Updates:    Upon entering the room, pt seated in wheelchair awaiting OT arrival and agreeable to OT intervention. Pt with no c/o pain this session. Pt propelled wheelchair 150' to day room with supervision and hemiplegic technique. Pt required cuing for set up of wheelchair in order to stand safely. Pt standing with max lifting assistance at table. Pt standing for 21 minutes with R knee blocked and R UE placed into weight bearing on the table during card task. Pt reaching to the R to obtain needed cards for game and min- mod A overall for standing balance. Pt returning to wheelchair for seated rest break and becoming very emotional while taking about phone calls from family members today. OT offered encouragement. Focus on anterior weight shift when standing and controlled sit . Pt completing 3 sit <>stands with therapist seated in front of him and R knee blocked. Pt standing with mod A overall for 2 minutes each time with max cuing to decrease rate of speed when sitting. OT reviewed self ROM for R UE in all planes with pt returning demonstrations with min verbal cuing for technique. Pt returning to room at end of session in same manner as above. Call bell within reach and chair alarm activated.   Therapy Documentation Precautions:   Precautions Precautions: Fall Restrictions Weight Bearing Restrictions: No General:   Vital Signs: Therapy Vitals Temp: 98.7 F (37.1 C) Temp Source: Oral Pulse Rate: 68 Resp: 18 BP: 127/83 Patient Position (if appropriate): Sitting Oxygen Therapy SpO2: 97 % O2 Device: Room Air   Therapy/Group: Individual Therapy  [name] 12/26/2018, 4:15 PM

## 2018-12-26 NOTE — Progress Notes (Signed)
[place] PHYSICAL MEDICINE & REHABILITATION PROGRESS NOTE   Subjective/Complaints:  Constipation, discussed with RN   Review of systems denies chest pain shortness of breath, nausea vomiting diarrhea, positive for constipation as noted, negative for abdominal pain  Objective:   No results found. Recent Labs    12/25/18 0712  WBC 9.3  HGB 14.7  HCT 42.5  PLT 214   Recent Labs    12/25/18 0712  NA 138  K 3.9  CL 98  CO2 29  GLUCOSE 151*  BUN 20  CREATININE 0.78  CALCIUM 9.2    Intake/Output Summary (Last 24 hours) at 12/26/2018 1042 Last data filed at 12/26/2018 0836 Gross per 24 hour  Intake 708 ml  Output 1450 ml  Net -742 ml     Physical Exam: Vital Signs Blood pressure 140/75, pulse (!) 57, temperature 98.3 F (36.8 C), temperature source Oral, resp. rate 18, height 5' 11"  (1.803 m), weight 97.2 kg, SpO2 92 %.   General: No acute distress Mood and affect are appropriate Heart: Regular rate and rhythm no rubs murmurs or extra sounds Lungs: Clear to auscultation, breathing unlabored, no rales or wheezes Abdomen: Positive bowel sounds, soft nontender to palpation, nondistended Extremities: No clubbing, cyanosis, or edema Skin: No evidence of breakdown, no evidence of rash Neurologic: Cranial nerves II through XII intact, motor strength is 5/5 in LEFT  deltoid, bicep, tricep, grip, hip flexor, knee extensors, ankle dorsiflexor and plantar flexor 0/5 RUE, 3- Left hip add trace hip/knee ext synergy Sensory exam normal sensation to light touch and proprioception in bilateral upper and lower extremities Cerebellar exam normal finger to nose to finger as well as heel to shin in bilateral upper and lower extremities Musculoskeletal: Full range of motion in all 4 extremities. No joint swelling   Assessment/Plan: 1. 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 which require 3+ hours per day of interdisciplinary therapy in a comprehensive  inpatient rehab setting.  Physiatrist is providing close team supervision and 24 hour management of active medical problems listed below.  Physiatrist and rehab team continue to assess barriers to discharge/monitor patient progress toward functional and medical goals  Care Tool:  Bathing    Body parts bathed by patient: Left arm, Chest, Abdomen, Front perineal area, Right upper leg, Left upper leg, Face   Body parts bathed by helper: Right arm, Buttocks, Right lower leg, Left lower leg     Bathing assist Assist Level: Moderate Assistance - Patient 50 - 74%     Upper Body Dressing/Undressing Upper body dressing   What is the patient wearing?: Pull over shirt    Upper body assist Assist Level: Moderate Assistance - Patient 50 - 74%    Lower Body Dressing/Undressing Lower body dressing      What is the patient wearing?: Underwear/pull up     Lower body assist Assist for lower body dressing: Moderate Assistance - Patient 50 - 74%     Toileting Toileting    Toileting assist Assist for toileting: Maximal Assistance - Patient 25 - 49%     Transfers Chair/bed transfer  Transfers assist  Chair/bed transfer activity did not occur: Safety/medical concerns  Chair/bed transfer assist level: Maximal Assistance - Patient 25 - 49%     Locomotion Ambulation   Ambulation assist   Ambulation activity did not occur: Safety/medical concerns          Walk 10 feet activity   Assist  Walk 10 feet activity did not occur: Safety/medical concerns  Walk 50 feet activity   Assist Walk 50 feet with 2 turns activity did not occur: Safety/medical concerns         Walk 150 feet activity   Assist Walk 150 feet activity did not occur: Safety/medical concerns         Walk 10 feet on uneven surface  activity   Assist Walk 10 feet on uneven surfaces activity did not occur: Safety/medical concerns         Wheelchair     Assist Will patient use  wheelchair at discharge?: No Type of Wheelchair: Manual    Wheelchair assist level: Supervision/Verbal cueing Max wheelchair distance: 150 ft     Wheelchair 50 feet with 2 turns activity    Assist        Assist Level: Supervision/Verbal cueing   Wheelchair 150 feet activity     Assist     Assist Level: Supervision/Verbal cueing    Medical Problem List and Plan: 1.Right side weakness with facial droop and dysarthriasecondary to 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 Status post TPA. Patient refused loop recorder Team conference today please see physician documentation under team conference tab, met with team face-to-face to discuss problems,progress, and goals. Formulized individual treatment plan based on medical history, underlying problem and comorbidities. 2. Antithrombotics: -DVT/anticoagulation:SCDs -antiplatelet therapy: Aspirin 81 mg daily 3. Pain Management:Tylenol as needed 4. Mood:Provide emotional support -antipsychotic agents: N/A 5. Neuropsych: This patientiscapable of making decisions on hisown behalf. 6. Skin/Wound Care:Routine skin checks 7. Fluids/Electrolytes/Nutrition:Routine in and outs with follow-up chemistries -encourage PO 8.Diabetes mellitus. Hemoglobin A1c 13.3. Novolog7units TID,Lantus insulin 30units daily.  CBG (last 3)  Recent Labs    12/25/18 1635 12/25/18 2126 12/26/18 0619  GLUCAP 204* 159* 181*  increase to 38U -diabetic education 9. Permissive Hypertension. Monitor with increased mobility. Patient on no prescription medications prior to admission Vitals:   12/25/18 1943 12/26/18 0418  BP: 140/77 140/75  Pulse: (!) 53 (!) 57  Resp: 16 18  Temp: 98.2 F (36.8 C) 98.3 F (36.8 C)  SpO2: 98% 92%   10.Medical noncompliance. Counseling    LOS: 2 days A FACE TO FACE EVALUATION WAS PERFORMED  [name] 12/26/2018, 10:42 AM

## 2018-12-27 ENCOUNTER — Inpatient Hospital Stay (HOSPITAL_COMMUNITY): Payer: Medicare HMO | Admitting: Occupational Therapy

## 2018-12-27 ENCOUNTER — Inpatient Hospital Stay (HOSPITAL_COMMUNITY): Payer: Medicare HMO

## 2018-12-27 ENCOUNTER — Inpatient Hospital Stay (HOSPITAL_COMMUNITY): Payer: Medicare HMO | Admitting: Speech Pathology

## 2018-12-27 ENCOUNTER — Inpatient Hospital Stay (HOSPITAL_COMMUNITY): Payer: Medicare HMO | Admitting: Physical Therapy

## 2018-12-27 LAB — GLUCOSE, CAPILLARY
Glucose-Capillary: 133 mg/dL — ABNORMAL HIGH (ref 70–99)
Glucose-Capillary: 95 mg/dL (ref 70–99)
Glucose-Capillary: 84 mg/dL (ref 70–99)
Glucose-Capillary: 126 mg/dL — ABNORMAL HIGH (ref 70–99)

## 2018-12-27 MED ORDER — POLYETHYLENE GLYCOL 3350 17 G PO PACK
17.0000 g | PACK | Freq: Every day | ORAL | Status: DC
  Administered 2018-12-27 – 2019-01-14 (×8): 17 g via ORAL
  Filled 2018-12-27 (×19): qty 1

## 2018-12-27 NOTE — Progress Notes (Signed)
Speech Language Pathology Daily Session Note  Patient Details  Name: Robert Brewer MRN: [id number] Date of Birth: 20-Aug-1945  Today's Date: 12/27/2018 SLP Individual Time: 0750-0850 SLP Individual Time Calculation (min): 60 min  Short Term Goals: Week 1: SLP Short Term Goal 1 (Week 1): Pt will utilize speech intelligibility strategies at sentence level with supervision A verbal cues to achieve 80% intelligibilty. SLP Short Term Goal 2 (Week 1): Pt will self-monitor and self-correct verbal speech errors with min A verbal cues.   Skilled Therapeutic Interventions: Patient received skilled SLP services to address dysarthria. Patient was educated on speech intelligibility strategies including: slow, loud, over-articulate, and pause. Patient participated in functional reading task of restaurant menus out loud while utilizing speech intelligiblity strategies. Patient required min verbal cues to increase loudness and slow rate of speech to increase listener understanding during functional reading task. Patient participated in a sentence creation task for given nouns requiring min verbal cues to slow rate of speech to increase listener understanding to 90%. Patient had questions regarding what dysarthria was. Patient education was completed regarding what dysarthria is and the etiology of patient's dysarthria. At the end of therapy session patient was upright in bed, call bell within reach, and bed alarm activated.  Pain Pain Assessment Pain Scale: 0-10 Pain Score: 0-No pain  Therapy/Group: Individual Therapy  Cristy Folks 12/27/2018, 11:05 AM

## 2018-12-27 NOTE — IPOC Note (Signed)
Overall Plan of Care [facility]) Patient Details Name: Robert Brewer MRN: [id number] DOB: 07/01/1946  Admitting Diagnosis: <principal problem not specified>  Hospital Problems: Active Problems:   Left pontine cerebrovascular accident [facility])     Functional Problem List: Nursing Motor, Endurance, Sensory  PT Balance, Endurance, Safety, Motor  OT Balance, Cognition, Endurance, Motor, Pain, Safety  SLP Cognition  TR         Basic ADL's: OT Grooming, Bathing, Dressing, Toileting, Eating     Advanced  ADL's: OT       Transfers: PT Bed to Chair, Car, Furniture, [employer], Metallurgist: PT Ambulation, Emergency planning/management officer, Stairs     Additional Impairments: OT Fuctional Use of Upper Extremity  SLP Communication, Social Cognition expression Awareness  TR      Anticipated Outcomes Item Anticipated Outcome  Self Feeding set up  Swallowing      Basic self-care  supervision  Toileting  supervision   Bathroom Transfers supervision  Bowel/Bladder  Maintain control of bowel and bladder  Transfers  supervision with LRAD  Locomotion  supervision with LRAD  Communication  Mod I  Cognition  Mod I  Pain  Remain pain free  Safety/Judgment  Remain free from harm   Therapy Plan: PT Intensity: Minimum of 1-2 x/day ,45 to 90 minutes PT Frequency: 5 out of 7 days PT Duration Estimated Length of Stay: 2.5-3 weeks OT Intensity: Minimum of 1-2 x/day, 45 to 90 minutes OT Frequency: 5 out of 7 days OT Duration/Estimated Length of Stay: 2.5 - 3 weeks SLP Intensity: Minumum of 1-2 x/day, 30 to 90 minutes SLP Frequency: 1 to 3 out of 7 days SLP Duration/Estimated Length of Stay: 2.5-3 weeks (posssibly shorter for ST)   Due to the current state of emergency, patients may not be receiving their 3-hours of Medicare-mandated therapy.   Team Interventions: Nursing Interventions Patient/Family Education, Disease Management/Prevention, Skin Care/Wound  Management, Discharge Planning, Psychosocial Support, Medication Management  PT interventions Cognitive remediation/compensation, Ambulation/gait training, Discharge planning, DME/adaptive equipment instruction, Functional mobility training, Pain management, Psychosocial support, Splinting/orthotics, Therapeutic Activities, UE/LE Strength taining/ROM, Visual/perceptual remediation/compensation, Training and development officer, Community reintegration, Functional electrical stimulation, Disease management/prevention, Neuromuscular re-education, Patient/family education, IT trainer, Therapeutic Exercise, Skin care/wound management, UE/LE Coordination activities, Wheelchair propulsion/positioning  OT Interventions Neuromuscular re-education, Training and development officer, Self Care/advanced ADL retraining, Therapeutic Exercise, Wheelchair propulsion/positioning, DME/adaptive equipment instruction, Cognitive remediation/compensation, Pain management, UE/LE Strength taining/ROM, Community reintegration, Technical sales engineer stimulation, Patient/family education, UE/LE Coordination activities, Discharge planning, Functional mobility training, Therapeutic Activities, Psychosocial support  SLP Interventions Cueing hierarchy, Speech/Language facilitation, Patient/family education  TR Interventions    SW/CM Interventions Discharge Planning, Psychosocial Support, Patient/Family Education   Barriers to Discharge MD  Medical stability  Nursing Other (comments) impaired self care capabilities  PT Decreased caregiver support unsure if wife can provide physical assist at d/c  OT Other (comments) none known at this time  SLP      SW       Team Discharge Planning: Destination: PT-Home ,OT- Home , SLP-Home Projected Follow-up: PT-Home health PT, 24 hour supervision/assistance, OT-  Outpatient OT, SLP-Home Health SLP Projected Equipment Needs: PT-To be determined, OT- To be determined, SLP-None recommended by  SLP Equipment Details: PT- , OT-  Patient/family involved in discharge planning: PT- Patient,  OT-Patient, SLP-Patient  MD ELOS: 16-20d Medical Rehab Prognosis:  Excellent Assessment:  73 year old right-handed male with history of diabetes mellitus, hypertension and medical noncompliance. Patient on no prescription medications. Per chart review  lives with spouse. Independent prior to admission. 2 level home. Presented to [facility]/17/2018 with acute onset of right side weakness, dysarthria and facial droop while mowing the grass. CT of the head at outside hospital showed no hemorrhage. Patient did receive TPA and was transferred to [facility]. CT angiogram of head and neck showed 1 cm hypodensity involving the left paramedian pons and suspicious for evolving acute ischemic infarct. CTA head and neck with no large vessel occlusion no significant intraluminal thrombus or dissection identified. MRI confirms acute infarct left paramedian pons small acute cortical infarct left occipital lobe. Negative for hemorrhage. Currently maintained on aspirin for CVA prophylaxis. Echocardiogram with ejection fraction of 27% normal systolic function. Negative bubble study. Recommendations hadbeen made for loop recorder placement and patient refused. Hemoglobin A1c 13.3 with insulin therapy as directed  Now requiring 24/7 Rehab RN,MD, as well as CIR level PT, OT and SLP.  Treatment team will focus on ADLs and mobility with goals set at Mod I /Sup See Team Conference Notes for weekly updates to the plan of care

## 2018-12-27 NOTE — Progress Notes (Signed)
Occupational Therapy Session Note  Patient Details  Name: Robert Brewer MRN: [id number] Date of Birth: Dec 23, 1945  Today's Date: 12/27/2018 OT Individual Time: 1000-1100 OT Individual Time Calculation (min): 60 min    Short Term Goals: Week 1:  OT Short Term Goal 1 (Week 1): Pt will perform UB dressing with min A to decrease level of assist with self care. OT Short Term Goal 2 (Week 1): Pt will perform toilet transfer with mod A.  OT Short Term Goal 3 (Week 1): Pt will perform toileting with mod A.  OT Short Term Goal 4 (Week 1): Pt will perform LB clothing management with mod A for standing balance.   Skilled Therapeutic Interventions/Progress Updates:    Pt seen for OT ADL bathing/dressing session. Pt sitting up in w/c upon arrival, denying pain and agreeable to tx session. Pt desiring to shower this session. Throughout session, he was able to bend to floor to reach feet in order to remove socks/shoes and wash feet in shower with close supervision- steadying assist.  He completed max A stand pivot transfer w/c<> BSC in shower. HE bathed seated on BSC with supervision, VCs for hemi-technique in order to wash L UE. Pt able to reach under [facility] in order to complete pericare/buttock hygiene. He returned to w/c in same manner as described above. He dressed seated in w/c, able to independently recall hemi-dressing technique, assist to pull down shirt in the back. He was able to thread B LEs into pants and stood at sink with max A in order to pull pants up, mod-max A for dynamic sitting balance and total A to fasten pants once he returned to sitting position.  Education/demonstration for one hand sock doning technique, pt able to cross LEs into figure four position to don sock and shoe with assist to obtain position with R LE.  Pt left seated in w/c at end of session, chair belt alarm on and all needs in reach.   Therapy Documentation Precautions:  Precautions Precautions:  Fall Restrictions Weight Bearing Restrictions: No Pain:   No/denies pain   Therapy/Group: Individual Therapy  Keyshaun Exley L 12/27/2018, 7:06 AM

## 2018-12-27 NOTE — Progress Notes (Signed)
[place] PHYSICAL MEDICINE & REHABILITATION PROGRESS NOTE   Subjective/Complaints:  No new issues overnight except he remains constipated.  He thinks he may need a suppository.  Thinks he may be able to move his thumb just a little bit today  Review of systems denies chest pain shortness of breath, nausea vomiting diarrhea, positive for constipation as noted, negative for abdominal pain  Objective:   No results found. Recent Labs    12/25/18 0712  WBC 9.3  HGB 14.7  HCT 42.5  PLT 214   Recent Labs    12/25/18 0712  NA 138  K 3.9  CL 98  CO2 29  GLUCOSE 151*  BUN 20  CREATININE 0.78  CALCIUM 9.2    Intake/Output Summary (Last 24 hours) at 12/27/2018 1000 Last data filed at 12/27/2018 0834 Gross per 24 hour  Intake 948 ml  Output 1250 ml  Net -302 ml     Physical Exam: Vital Signs Blood pressure 133/78, pulse (!) 55, temperature 97.9 F (36.6 C), resp. rate 18, height 5\' 11"  (1.803 m), weight 96.6 kg, SpO2 99 %.   General: No acute distress Mood and affect are appropriate Heart: Regular rate and rhythm no rubs murmurs or extra sounds Lungs: Clear to auscultation, breathing unlabored, no rales or wheezes Abdomen: Positive bowel sounds, soft nontender to palpation, nondistended Extremities: No clubbing, cyanosis, or edema Skin: No evidence of breakdown, no evidence of rash Neurologic: Cranial nerves II through XII intact, motor strength is 5/5 in LEFT  deltoid, bicep, tricep, grip, hip flexor, knee extensors, ankle dorsiflexor and plantar flexor 0/5 RUE, 3- Left hip add trace hip/knee ext synergy Sensory exam normal sensation to light touch and proprioception in bilateral upper and lower extremities Cerebellar exam normal finger to nose to finger as well as heel to shin in bilateral upper and lower extremities Musculoskeletal: Full range of motion in all 4 extremities. No joint swelling   Assessment/Plan: 1. Functional deficits secondary to Left paramedian  pontine infarct which require 3+ hours per day of interdisciplinary therapy in a comprehensive inpatient rehab setting.  Physiatrist is providing close team supervision and 24 hour management of active medical problems listed below.  Physiatrist and rehab team continue to assess barriers to discharge/monitor patient progress toward functional and medical goals  Care Tool:  Bathing    Body parts bathed by patient: Left arm, Chest, Abdomen, Front perineal area, Right upper leg, Left upper leg, Face   Body parts bathed by helper: Right arm, Buttocks, Right lower leg, Left lower leg     Bathing assist Assist Level: Moderate Assistance - Patient 50 - 74%     Upper Body Dressing/Undressing Upper body dressing   What is the patient wearing?: Pull over shirt    Upper body assist Assist Level: Moderate Assistance - Patient 50 - 74%    Lower Body Dressing/Undressing Lower body dressing      What is the patient wearing?: Underwear/pull up, Pants     Lower body assist Assist for lower body dressing: Moderate Assistance - Patient 50 - 74%     Toileting Toileting    Toileting assist Assist for toileting: Total Assistance - Patient < 25%     Transfers Chair/bed transfer  Transfers assist  Chair/bed transfer activity did not occur: Safety/medical concerns  Chair/bed transfer assist level: Maximal Assistance - Patient 25 - 49%(2 assist, S/P)     Locomotion Ambulation   Ambulation assist   Ambulation activity did not occur: Safety/medical concerns  Walk 10 feet activity   Assist  Walk 10 feet activity did not occur: Safety/medical concerns        Walk 50 feet activity   Assist Walk 50 feet with 2 turns activity did not occur: Safety/medical concerns         Walk 150 feet activity   Assist Walk 150 feet activity did not occur: Safety/medical concerns         Walk 10 feet on uneven surface  activity   Assist Walk 10 feet on uneven  surfaces activity did not occur: Safety/medical concerns         Wheelchair     Assist Will patient use wheelchair at discharge?: No Type of Wheelchair: Manual    Wheelchair assist level: Supervision/Verbal cueing Max wheelchair distance: 150 ft     Wheelchair 50 feet with 2 turns activity    Assist        Assist Level: Supervision/Verbal cueing   Wheelchair 150 feet activity     Assist     Assist Level: Supervision/Verbal cueing    Medical Problem List and Plan: 1.Right side weakness with facial droop and dysarthriasecondary to 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 Status post TPA. Patient refused loop recorder CIR PT, OT. SLP  2. Antithrombotics: -DVT/anticoagulation:SCDs -antiplatelet therapy: Aspirin 81 mg daily 3. Pain Management:Tylenol as needed 4. Mood:Provide emotional support -antipsychotic agents: N/A 5. Neuropsych: This patientiscapable of making decisions on hisown behalf. 6. Skin/Wound Care:Routine skin checks 7. Fluids/Electrolytes/Nutrition:Routine in and outs with follow-up chemistries -encourage PO 8.Diabetes mellitus. Hemoglobin A1c 13.3. Novolog7units TID,Lantus insulin 30units daily.  CBG (last 3)  Recent Labs    12/26/18 1634 12/26/18 2134 12/27/18 0632  GLUCAP 139* 85 133*  increase to 38U-improved 6/4 -diabetic education 9. Permissive Hypertension. Monitor with increased mobility. Patient on no prescription medications prior to admission Vitals:   12/26/18 1411 12/26/18 2025  BP: 127/83 133/78  Pulse: 68 (!) 55  Resp: 18 18  Temp: 98.7 F (37.1 C) 97.9 F (36.6 C)  SpO2: 97% 99%  Controlled 6/4 10.Medical noncompliance. Counseling    LOS: 3 days A FACE TO FACE EVALUATION WAS PERFORMED  [name] 12/27/2018, 10:00 AM

## 2018-12-27 NOTE — Progress Notes (Signed)
Physical Therapy Session Note  Patient Details  Name: Robert Brewer MRN: [id number] Date of Birth: 07/21/1946  Today's Date: 12/27/2018 PT Individual Time: 1330-1430 PT Individual Time Calculation (min): 60 min  Short Term Goals: Week 1:  PT Short Term Goal 1 (Week 1): Pt will complete all bed mobility with mod assist without hospital bed features. PT Short Term Goal 2 (Week 1): Pt will ambulate 50 ft with LRAD & mod assist. PT Short Term Goal 3 (Week 1): Pt will negotiate 8 steps with B rails & mod assist. PT Short Term Goal 4 (Week 1): Pt will consistently complete bed<>w/c transfers with min assist.  Skilled Therapeutic Interventions/Progress Updates:    Pt received seated in w/c in room, agreeable to PT session. No complaints of pain. Manual w/c propulsion x 150 ft with use of LUE/LE and Supervision. Squat pivot transfer w/c to mat table with max A. Sit to stand with mod A. Attempt to perform mini-squats in standing with no AD, pt has LOB posteriorly and requires max A to recover. Standing mini-squats x 10 reps with RW with R hand orthosis. Sit to stand x 5 reps from elevated mat table with mod A and no AD with focus on eccentric control when sitting. Standing alt L/R marches with RW and R knee blocked in stance. Forward/backwards x 1 step with RW and max A with R knee blocked in stance. Nustep level 5, 2 x 5 min with use of B UE/LE for global strengthening. Squat pivot transfer w/c to recliner with max A. Pt left seated in recliner in room with needs in reach, chair alarm in place.  Therapy Documentation Precautions:  Precautions Precautions: Fall Restrictions Weight Bearing Restrictions: No    Therapy/Group: Individual Therapy   Excell Seltzer, PT, DPT  12/27/2018, 4:07 PM

## 2018-12-27 NOTE — Progress Notes (Signed)
Physical Therapy Session Note  Patient Details  Name: Robert Brewer MRN: [id number] Date of Birth: 09/24/1945  Today's Date: 12/27/2018 PT Individual Time: 0915-1000 PT Individual Time Calculation (min): 45 min   Short Term Goals: Week 1:  PT Short Term Goal 1 (Week 1): Pt will complete all bed mobility with mod assist without hospital bed features. PT Short Term Goal 2 (Week 1): Pt will ambulate 50 ft with LRAD & mod assist. PT Short Term Goal 3 (Week 1): Pt will negotiate 8 steps with B rails & mod assist. PT Short Term Goal 4 (Week 1): Pt will consistently complete bed<>w/c transfers with min assist.  Skilled Therapeutic Interventions/Progress Updates:     Patient in bed upon PT arrival. Patient alert and agreeable to PT session.  Therapeutic Activity: Bed Mobility: Patient performed supine to sit with supervision with HOB elevated to 75 degrees. Sitting EOB patient patient donned B socks and shoes with max A.  Transfers: Patient performed sit to/from stand x3 using a L rail with min A-CGA and stand pivot x1 using a RW with R hand splint with mod A. Provided verbal cues for hand placement on RW and leaning forward to stand.  Gait Training:  Patient ambulated 25 feet x2 using a L rail with a mirror in front for visual feed back with min A-CGA for physical support and balance. Ambulated with decreased step length and height on L, narrow BOS, increased toe out in swing and stance on L, decreased stance time on L, decreased toe clearance on L, and intermittent mild knee buckling on L. Provided verbal cues for flexing at the hip and bringing his L knee forward when stepping with the L, erect posture, increased BOS when stepping with the L, and looking ahead for visual feeback and improved posture. Between gait training trials, patient performed pre-gait stepping with the L foot with a weight shift onto the L leg before stepping back. Focused on leading with the knee and toes forward when  advancing the foot, quad activation when shifting weight onto the L leg, and erect posture when stepping.   Wheelchair Mobility:  Patient propelled wheelchair 150 feet x2 with supervision using L UE and L LE with hemi technique. Provided verbal cues for turning and avoiding obstacles on the R.   Patient in w/c at end of session with breaks locked, seat belt alarm set, and all needs within reach.    Therapy Documentation Precautions:  Precautions Precautions: Fall Restrictions Weight Bearing Restrictions: No Pain: Patient denied pain throughout session.    Therapy/Group: Individual Therapy  Kailea Dannemiller L Law Corsino PT, DPT  12/27/2018, 3:43 PM

## 2018-12-28 ENCOUNTER — Inpatient Hospital Stay (HOSPITAL_COMMUNITY): Payer: Medicare HMO | Admitting: Occupational Therapy

## 2018-12-28 ENCOUNTER — Inpatient Hospital Stay (HOSPITAL_COMMUNITY): Payer: Medicare HMO | Admitting: Physical Therapy

## 2018-12-28 LAB — GLUCOSE, CAPILLARY
Glucose-Capillary: 120 mg/dL — ABNORMAL HIGH (ref 70–99)
Glucose-Capillary: 200 mg/dL — ABNORMAL HIGH (ref 70–99)
Glucose-Capillary: 88 mg/dL (ref 70–99)
Glucose-Capillary: 66 mg/dL — ABNORMAL LOW (ref 70–99)
Glucose-Capillary: 106 mg/dL — ABNORMAL HIGH (ref 70–99)

## 2018-12-28 MED ORDER — BISACODYL 10 MG RE SUPP
10.0000 mg | Freq: Every day | RECTAL | Status: DC | PRN
  Filled 2018-12-28 (×2): qty 1

## 2018-12-28 NOTE — Progress Notes (Signed)
[facility] PHYSICAL MEDICINE & REHABILITATION PROGRESS NOTE   Subjective/Complaints: Patient still has some constipation.  We discussed his bowel program with nursing.  Bisacodyl ordered, already has tried MiraLAX and stool softener   Review of systems denies chest pain shortness of breath, nausea vomiting diarrhea, positive for constipation as noted, negative for abdominal pain  Objective:   No results found. No results for input(s): WBC, HGB, HCT, PLT in the last 72 hours. No results for input(s): NA, K, CL, CO2, GLUCOSE, BUN, CREATININE, CALCIUM in the last 72 hours.  Intake/Output Summary (Last 24 hours) at 12/28/2018 0924 Last data filed at 12/28/2018 0739 Gross per 24 hour  Intake 694 ml  Output 1325 ml  Net -631 ml     Physical Exam: Vital Signs Blood pressure 117/69, pulse (!) 56, temperature 98.7 F (37.1 C), temperature source Oral, resp. rate 16, height 5\' 11"  (1.803 m), weight 96.5 kg, SpO2 93 %.   General: No acute distress Mood and affect are appropriate Heart: Regular rate and rhythm no rubs murmurs or extra sounds Lungs: Clear to auscultation, breathing unlabored, no rales or wheezes Abdomen: Positive bowel sounds, soft nontender to palpation, nondistended Extremities: No clubbing, cyanosis, or edema Skin: No evidence of breakdown, no evidence of rash Neurologic: Cranial nerves II through XII intact, motor strength is 5/5 in LEFT  deltoid, bicep, tricep, grip, hip flexor, knee extensors, ankle dorsiflexor and plantar flexor 0/5 RUE, 3- Left hip add trace hip/knee ext synergy Sensory exam normal sensation to light touch and proprioception in bilateral upper and lower extremities Cerebellar exam normal finger to nose to finger as well as heel to shin in bilateral upper and lower extremities Musculoskeletal: Full range of motion in all 4 extremities. No joint swelling   Assessment/Plan: 1. 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  Physiatrist is providing close team supervision and 24 hour management of active medical problems listed below.  Physiatrist and rehab team continue to assess barriers to discharge/monitor patient progress toward functional and medical goals  Care Tool:  Bathing    Body parts bathed by patient: Left arm, Chest, Abdomen, Front perineal area, Right upper leg, Left upper leg, Face, Right arm, Buttocks, Right lower leg, Left lower leg   Body parts bathed by helper: Right arm, Buttocks, Right lower leg, Left lower leg     Bathing assist Assist Level: Minimal Assistance - Patient > 75%     Upper Body Dressing/Undressing Upper body dressing   What is the patient wearing?: Pull over shirt    Upper body assist Assist Level: Contact Guard/Touching assist    Lower Body Dressing/Undressing Lower body dressing      What is the patient wearing?: Underwear/pull up, Pants     Lower body assist Assist for lower body dressing: Maximal Assistance - Patient 25 - 49%     Toileting Toileting    Toileting assist Assist for toileting: Total Assistance - Patient < 25%     Transfers Chair/bed transfer  Transfers assist  Chair/bed transfer activity did not occur: Safety/medical concerns  Chair/bed transfer assist level: Maximal Assistance - Patient 25 - 49%     Locomotion Ambulation   Ambulation assist   Ambulation activity did not occur: Safety/medical concerns  Assist level: Minimal Assistance - Patient > 75% Assistive device: Other (comment)(with L rail) Max distance: 25'   Walk 10 feet activity   Assist  Walk 10 feet activity did not occur: Safety/medical concerns  Assist level: Minimal Assistance - Patient > 75% Assistive device: Other (comment)(L rail)   Walk 50 feet activity   Assist Walk 50 feet with 2 turns activity did not occur: Safety/medical concerns          Walk 150 feet activity   Assist Walk 150 feet activity did not occur: Safety/medical concerns         Walk 10 feet on uneven surface  activity   Assist Walk 10 feet on uneven surfaces activity did not occur: Safety/medical concerns         Wheelchair     Assist Will patient use wheelchair at discharge?: No Type of Wheelchair: Manual    Wheelchair assist level: Supervision/Verbal cueing Max wheelchair distance: 150 ft     Wheelchair 50 feet with 2 turns activity    Assist        Assist Level: Supervision/Verbal cueing   Wheelchair 150 feet activity     Assist     Assist Level: Supervision/Verbal cueing    Medical Problem List and Plan: 1.Right side weakness with facial droop and dysarthriasecondary to 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 Status post TPA. Patient refused loop recorder CIR PT, OT. SLP  2. Antithrombotics: -DVT/anticoagulation:SCDs -antiplatelet therapy: Aspirin 81 mg daily 3. Pain Management:Tylenol as needed 4. Mood:Provide emotional support -antipsychotic agents: N/A 5. Neuropsych: This patientiscapable of making decisions on hisown behalf. 6. Skin/Wound Care:Routine skin checks 7. Fluids/Electrolytes/Nutrition:Routine in and outs with follow-up chemistries -encourage PO 8.Diabetes mellitus. Hemoglobin A1c 13.3. Novolog7units TID,Lantus insulin 30units daily.  CBG (last 3)  Recent Labs    12/27/18 1624 12/27/18 2107 12/28/18 0606  GLUCAP 84 126* 120*  increase to 38U-improved 6/5 -diabetic education 9. Permissive Hypertension. Monitor with increased mobility. Patient on no prescription medications prior to admission Vitals:   12/27/18 1930 12/28/18 0538  BP: 117/71 117/69  Pulse: 63 (!) 56  Resp: 20 16  Temp: 98.6 F (37 C) 98.7 F (37.1 C)  SpO2: 96% 93%  Controlled 6/5 10.Medical  noncompliance. Counseling 11.  Constipation add bisacodyl suppository  LOS: 4 days A FACE TO FACE EVALUATION WAS PERFORMED  [name] 12/28/2018, 9:24 AM

## 2018-12-28 NOTE — Progress Notes (Signed)
Occupational Therapy Session Note  Patient Details  Name: Robert Brewer MRN: [id number] Date of Birth: [birth year]  Today's Date: 12/28/2018 OT Individual Time: 2119-4174 and 0814-4818 OT Individual Time Calculation (min): 71 min and 74 min  Short Term Goals: Week 1:  OT Short Term Goal 1 (Week 1): Pt will perform UB dressing with min A to decrease level of assist with self care. OT Short Term Goal 2 (Week 1): Pt will perform toilet transfer with mod A.  OT Short Term Goal 3 (Week 1): Pt will perform toileting with mod A.  OT Short Term Goal 4 (Week 1): Pt will perform LB clothing management with mod A for standing balance.   Skilled Therapeutic Interventions/Progress Updates:    Pt greeted in w/c. Reporting a little Rt shoulder pain, however he did not want to take anything medicinally for it. He declined showering or toileting, opting instead to complete a few grooming tasks at the sink. Sit<stand at sink completed with Mod A and Rt knee supported. OT facilitated use of Rt as gross stabilizer during oral care. He brushed his teeth for several minutes with Rt hand weightbearing on the sink. Steady assist for balance and tactile cues provided to maintain Rt knee extension. During handwashing, he needed manual assist to prevent Rt knee buckling due to fatigue and bilateral UE demands of task. Pt then engaged in shaving w/c level with Min A for reaching under Rt side of chin. Once again, [facility] for using Rt as gross stabilizer while dispensing shaving cream. For remainder of session, worked on [employer] with use of UE ranger. He exhibits proximal strength though has hx RTC injury. Noted popping/clicking sounds during gentle ROM. He completed exercises at edge of chair. Vcs required for sitting upright vs leaning Lt arm onto armrest. Pt required assist for keeping hand pronated on ranger and extending elbow. We broke up exercises with gentle self ROM of elbow, wrist, and digits. Educated pt on importance of  self ROM and limb elevation for edema control. He verbalized understanding. At end of session pt remained in w/c with all needs within reach, chair alarm set, and Rt arm elevated.    2nd Session 1:1 tx (74 min) Pt greeted in w/c, eating his lunch. OT facilitated integration of Lt when scooping up final bites and also when eating fruit from cup. We discussed diabetic-friendly cooking at home. Emphasized increasing consumption of vegetables. He reports he often orders take-out, but wants to do better at home. We discussed his increased CVA risk and importance of lifestyle changes to prevent another CVA. He verbalized understanding. Per pt: "This will not happen again." For remainder of session, we reviewed a self ROM handout for room use. Educated pt on importance of self ROM for edema mgt, preventing joint deformities, and preserving functional ranges. Pt required vcs for joint protection, pacing, and gentle stretching in end ranges. We used his bedside table during towel slide exercises, encouraged completion of these exercises with setup outside of therapies. At end of session pt remained in w/c and was left with all needs within reach and chair alarm set.    OT offered hot/cold modalities to address Rt shoulder pain, however pt declined  Therapy Documentation Precautions:  Precautions Precautions: Fall Restrictions Weight Bearing Restrictions: No Vital Signs: Therapy Vitals Temp: 98.4 F (36.9 C) Temp Source: Oral Pulse Rate: 66 Resp: 19 BP: 129/78 Patient Position (if appropriate): Sitting Oxygen Therapy SpO2: 99 % O2 Device: Room Air ADL:  Therapy/Group: Individual Therapy  Wallice Granville A Nariyah Osias 12/28/2018, 4:15 PM

## 2018-12-28 NOTE — Plan of Care (Signed)
  Problem: RH SKIN INTEGRITY Goal: RH STG SKIN FREE OF INFECTION/BREAKDOWN Description Maintain skin integrity with minimal assist.   Outcome: Progressing Goal: RH STG MAINTAIN SKIN INTEGRITY WITH ASSISTANCE Description STG Maintain Skin Integrity With minimal  Assistance.  Outcome: Progressing   Problem: RH SAFETY Goal: RH STG ADHERE TO SAFETY PRECAUTIONS W/ASSISTANCE/DEVICE Description STG Adhere to Safety Precautions With minimal Assistance/Device.  Outcome: Progressing Goal: RH STG DECREASED RISK OF FALL WITH ASSISTANCE Description STG Decreased Risk of Fall With minimal Assistance.  Outcome: Progressing   Problem: RH KNOWLEDGE DEFICIT Goal: RH STG INCREASE KNOWLEDGE OF DIABETES Outcome: Progressing Goal: RH STG INCREASE KNOWLEDGE OF STROKE PROPHYLAXIS Outcome: Progressing

## 2018-12-28 NOTE — Progress Notes (Signed)
Physical Therapy Session Note  Patient Details  Name: Robert Brewer MRN: [id number] Date of Birth: 1945/12/31  Today's Date: 12/28/2018 PT Individual Time: 0800-0915 PT Individual Time Calculation (min): 75 min   Short Term Goals: Week 1:  PT Short Term Goal 1 (Week 1): Pt will complete all bed mobility with mod assist without hospital bed features. PT Short Term Goal 2 (Week 1): Pt will ambulate 50 ft with LRAD & mod assist. PT Short Term Goal 3 (Week 1): Pt will negotiate 8 steps with B rails & mod assist. PT Short Term Goal 4 (Week 1): Pt will consistently complete bed<>w/c transfers with min assist.  Skilled Therapeutic Interventions/Progress Updates:   Pt received sitting in WC and agreeable to PT. WC mobility to rehab gym with supervision assist from PT with min cues for use of UE and LE to reduce energy expenditure.   Gait training for RLE Neuromotor re-education in parallel bars forward/backward 2x 32f each direction, mod assist overall with 1-2 UE support on rails.  Additional gait training with RW x 464f+ 5012fith mod assist over all and moderate cues for step length AD management, improved knee extension, and gait pattern in turns. With increased distance, pt able to progress to partial step through gait pattern, but maintain flexed knee on the R.   Kinetron reciprocal movement training 4 x 1 minute with 1.5 minute therapeutic rest break between bouts. Min cues for neutral hip and knee positioning and well as symmetry of movement throughout Neuromotor re-education.   UE NMR for blocked elbow flexion/extension in gravity eliminated position and towel to reduce friction on table. Completed 3 x  2mi10mes with AAROM from PT and moderate cues to prevent compensatory shoulder movement.   Patient returned to room and [facility] reach and all needs met.        Therapy Documentation Precautions:  Precautions Precautions: Fall Restrictions Weight  Bearing Restrictions: No Vital Signs: Therapy Vitals Temp: 98.7 F (37.1 C) Temp Source: Oral Pulse Rate: (!) 56(error) Resp: 16 BP: 117/69 Patient Position (if appropriate): Lying Oxygen Therapy SpO2: 93 % O2 Device: Room Air Pain: Pain Assessment Pain Scale: 0-10 Pain Score: 0-No pain    Therapy/Group: Individual Therapy  AustLorie Phenix/2020, 9:18 AM

## 2018-12-28 NOTE — Progress Notes (Addendum)
Hypoglycemic Event  CBG: 66  Treatment: 4 oz juice/soda  Symptoms: None  Follow-up CBG: Time:2334 CBG Result:106 Possible Reasons for Event: Unknown  Comments/MD notified:will monitor, MD aware.    Bebe Moncure, [employer]

## 2018-12-29 ENCOUNTER — Inpatient Hospital Stay (HOSPITAL_COMMUNITY): Payer: Medicare HMO | Admitting: Occupational Therapy

## 2018-12-29 ENCOUNTER — Inpatient Hospital Stay (HOSPITAL_COMMUNITY): Payer: Medicare HMO

## 2018-12-29 LAB — GLUCOSE, CAPILLARY
Glucose-Capillary: 82 mg/dL (ref 70–99)
Glucose-Capillary: 185 mg/dL — ABNORMAL HIGH (ref 70–99)
Glucose-Capillary: 103 mg/dL — ABNORMAL HIGH (ref 70–99)
Glucose-Capillary: 69 mg/dL — ABNORMAL LOW (ref 70–99)
Glucose-Capillary: 277 mg/dL — ABNORMAL HIGH (ref 70–99)
Glucose-Capillary: 249 mg/dL — ABNORMAL HIGH (ref 70–99)

## 2018-12-29 MED ORDER — INSULIN GLARGINE 100 UNIT/ML ~~LOC~~ SOLN
34.0000 [IU] | Freq: Every day | SUBCUTANEOUS | Status: DC
  Administered 2018-12-30 – 2018-12-31 (×2): 34 [IU] via SUBCUTANEOUS
  Filled 2018-12-29 (×2): qty 0.34

## 2018-12-29 NOTE — Progress Notes (Signed)
Robert Brewer is a 73 y.o. male admitted for CIR with right-sided weakness facial droop and dysarthria following a left pontine infarction and left occipital infarct.  Status post TPA.  Declines ILR    Subjective: No new complaints. No new problems.  Mild asymptomatic hypoglycemia with blood sugar of 66 during the night.  Objective: Vital signs in last 24 hours: Temp:  [97.8 F (36.6 C)-98.4 F (36.9 C)] 97.8 F (36.6 C) (06/05 1956) Pulse Rate:  [58-66] 58 (06/05 1956) Resp:  [19] 19 (06/05 1956) BP: (129)/(70-78) 129/70 (06/05 1956) SpO2:  [97 %-99 %] 97 % (06/05 1956) Weight change:  Last BM Date: 12/28/18  Intake/Output from previous day: 06/05 0701 - 06/06 0700 In: 9 [P.O.:590] Out: 925 [Urine:925] Last cbgs: CBG (last 3)  Recent Labs    12/28/18 2234 12/29/18 0623 12/29/18 0759  GLUCAP 106* 82 185*   Patient Vitals for the past 24 hrs:  BP Temp Temp src Pulse Resp SpO2  12/28/18 1956 129/70 97.8 F (36.6 C) Oral (!) 58 19 97 %  12/28/18 1521 129/78 98.4 F (36.9 C) Oral 66 19 99 %     Physical Exam General: No apparent distress   HEENT: not dry Lungs: Normal effort. Lungs clear to auscultation, no crackles or wheezes. Cardiovascular: Rhythm slightly irregular.  Grade 2/6 systolic murmur Abdomen: S/NT/ND; BS(+) Musculoskeletal:  unchanged Neurological: No new neurological deficits with right-sided weakness Wounds: N/A    Skin: clear  Mental state: Alert, oriented, cooperative    Lab Results: BMET    Component Value Date/Time   NA 138 12/25/2018 0712   K 3.9 12/25/2018 0712   CL 98 12/25/2018 0712   CO2 29 12/25/2018 0712   GLUCOSE 151 (H) 12/25/2018 0712   BUN 20 12/25/2018 0712   CREATININE 0.78 12/25/2018 0712   CALCIUM 9.2 12/25/2018 0712   GFRNONAA >60 12/25/2018 0712   GFRAA >60 12/25/2018 0712   CBC    Component Value Date/Time   WBC 9.3 12/25/2018 0712   RBC 4.95 12/25/2018 0712   HGB 14.7 12/25/2018 0712   HCT 42.5  12/25/2018 0712   PLT 214 12/25/2018 0712   MCV 85.9 12/25/2018 0712   MCH 29.7 12/25/2018 0712   MCHC 34.6 12/25/2018 0712   RDW 12.7 12/25/2018 0712   LYMPHSABS 2.0 12/25/2018 0712   MONOABS 0.7 12/25/2018 0712   EOSABS 0.2 12/25/2018 0712   BASOSABS 0.0 12/25/2018 0712     Medications: I have reviewed the patient's current medications.  Assessment/Plan:  Functional deficits following left paramedian pontine infarct.  Continue CIR Diabetes mellitus.  Will decrease basal insulin slightly Hypertension.  Blood pressure well controlled off medications   Length of stay, days: 5  Marletta Lor , MD 12/29/2018, 10:48 AM

## 2018-12-29 NOTE — Progress Notes (Addendum)
Physical Therapy Session Note  Patient Details  Name: Robert Brewer MRN: [id number] Date of Birth: 1945/11/29  Today's Date: 12/29/2018 PT Individual Time: 0800-0900 PT Individual Time Calculation (min): 60 min  and Today's Date: 12/29/2018  Short Term Goals: Week 1:  PT Short Term Goal 1 (Week 1): Pt will complete all bed mobility with mod assist without hospital bed features. PT Short Term Goal 2 (Week 1): Pt will ambulate 50 ft with LRAD & mod assist. PT Short Term Goal 3 (Week 1): Pt will negotiate 8 steps with B rails & mod assist. PT Short Term Goal 4 (Week 1): Pt will consistently complete bed<>w/c transfers with min assist.  Skilled Therapeutic Interventions/Progress Updates:     Patient in bed upon PT arrival. Patient alert and agreeable to PT session. Patient stated that he performed hip flexion in sitting 5 reps every 5 min when sitting in the recliner last night for strengthening.   Therapeutic Activity: Bed Mobility: Patient performed supine to/from sit with CGA-supervision. Provided verbal cues for clearing heels off the bed before sitting up. Transfers: Patient performed sit to/from stand x2 with min A from the w/c and stand pivot x1 from the bed to the w/c and a toilet transfer from the w/c with mod A to stand and for LOB x1 during each transfer when pivoting using RW with R hand splint. Provided verbal cues for placing feet underneath him before standing and leaning far forward to stand. Had more difficulty today lifting R foot to step when pivoting.   Gait Training:  Patient ambulated 15 feet x2 using RW, R DF ACE wrap, and R hand splint with mod-min A for physical support and guarding R knee to prevent buckling with w/c close behind. Ambulated with decreased step length R>L, intermittent R knee buckling that improved as he continued to ambulate, intermittent toe out on R in stance but able to self correct, and forward trunk flexion. Provided verbal cues for erect posture,  quad activation in stance on R, increased step length, and leading with his knee cap to initiate stepping with R foot with toes forward. Reported increased difficulty with advancing and standing on R LE today, PT educated on allowing himself to rest when he is not in PT, suggested performing seated exercises no more than 2x outside of therapy sessions per day to prevent muscle fatigue.   Wheelchair Mobility:  Patient propelled wheelchair 100 feet x2 with supervision using L UE and LE. Patient used teach back method for use of UE with LE and turning technique.  Neuromuscular Re-ed: Patient performed forward propulsion with B LEs in the w/c with emphasis on R hamstring activation 15' x2 with min A-CGA for pulling with R hamstring and PNF tapping to promote hamstring activation.  In sitting he performed elbow flexion against gravity with CGA-min A for full ROM and elbow extension with min-mod resistance for triceps activation through full ROM with PNF tapping to biceps and triceps respectively to promote muscle activation.    Therapeutic Exercise: Patient performed the following exercises with verbal and tactile cues for proper technique. -Seated isometric hip adduction with pillow and 5 sec holds x5, instructed on adding to seated exercises 2x per day  Educated patient on types of strokes, what is a stroke, and signs and symptoms of stroke during session. Patient able to recall 4/6 signs and symptoms using BE FAST acronym immediately after education and 6/6 at end of session.   Patient seated on toilet, per RN request  to attempt to have a BM, at end of session instructed to not get up on his own and to pull the call bell when he was finished. Nursing aware.    Therapy Documentation Precautions:  Precautions Precautions: Fall Restrictions Weight Bearing Restrictions: No Pain: Patient denied pain throughout session.    Therapy/Group: Individual Therapy  Darnelle Derrick L Eryk Beavers PT,  DPT  12/29/2018, 11:21 AM

## 2018-12-29 NOTE — Progress Notes (Signed)
Hypoglycemic Event  CBG: 69 at 1148  Treatment: 8 oz of orange juice  Symptoms: light headed   Follow-up CBG: Time:1222 CBG Result:103  Possible Reasons for Event: unknown  Comments/MD notified: no    Robert Brewer W [name]

## 2018-12-29 NOTE — Progress Notes (Signed)
Occupational Therapy Session Note  Patient Details  Name: Robert Brewer MRN: [id number] Date of Birth: [birth year]  Today's Date: 12/29/2018 OT Individual Time: 1103-1200 and 1435-1536 OT Individual Time Calculation (min): 57 min and 61 min  Short Term Goals: Week 1:  OT Short Term Goal 1 (Week 1): Pt will perform UB dressing with min A to decrease level of assist with self care. OT Short Term Goal 2 (Week 1): Pt will perform toilet transfer with mod A.  OT Short Term Goal 3 (Week 1): Pt will perform toileting with mod A.  OT Short Term Goal 4 (Week 1): Pt will perform LB clothing management with mod A for standing balance.   Skilled Therapeutic Interventions/Progress Updates:    Pt greeted in bed, reported Rt shoulder pain was much better than it was yesterday. Wanting to start session by brushing his teeth. Supervision for sit<stand at sink! For 11 minutes during oral care/grooming task participation, he stood with close supervision-Min A for balance. Tactile cues provided for keeping Rt knee extended and steady assist for maintaining Rt hand weightbearing on sink. During handwashing, pt with increased Rt knee buckling and therefore required more balance assist. When he returned to sitting, pt verbalized that he wanted to do stand pivot toilet transfers with RN staff. Per pt, some staff members would set him up for stand pivot, and others would not per safety plan. We practiced his method of stand step toilet transfers that he has utilized with some staff members. The w/c was placed directly in front of toilet and pt had to turn in a circle to sit on the toilet. He required Min-Mod A. Discussed increased safety with setting up w/c directly in front of grab bars to facilitate more pivotting and less stepping. Pt required Min-Mod A with this setup as well, though fall risk was visibly decreased. Pt in agreement with directing w/c setup this way while toileting with staff. He refuses to use the [facility].  Changed safety plan to ok stand pivot toilet transfers. Discussed with RN method of safest setup and she verbalized understanding. For remainder of session, worked on increasing functional independence with doffing/donning footwear. Pt required vcs for increasing use of figure 4 position vs leaning forward for safety. He did not lose his balance while leaning forward, however pt reported he felt "a little wobbly" at times. Provided him with shoe buttons, and pt able to fasten laces this way with supervision. At end of session pt was left in w/c with chair alarm set and all needs within reach.   2nd Session 1:1 tx (61 min) Pt greeted in w/c. ADL needs met and ready to go. Started tx with IADL task of bedmaking. Pt stripped linen off of bed w/c level with vcs for locking brakes prior to dynamic reaching. He also assisted OT with donning fitted sheet to work on dynamic reach/trunk strength. Sit<stand with Mod A at elevated bed. Pt side-stepped Lt>Rt to raise sheets/blankets from base of bed towards headboard. He required support at Rt knee, vcs for affected LE placement + widening BOS. Mod A for balance overall. Active weightbearing through Rt elbow when reaching towards opposite side of bed to adjust linen. OT initiated seated rest when Rt knee buckling increased. Pt utilized one handed techniques for doffing/donning pillowcases while seated. Afterwards to work on general strengthening and endurance, he self propelled w/c ~510 ft down hallway and back to room using hemi technique. Stand pivot<recliner completed with Mod A going towards Lt side.  He was left in recliner with all needs within reach and safety belt fastened.    Therapy Documentation Precautions:  Precautions Precautions: Fall Restrictions Weight Bearing Restrictions: No Pain: No c/o pain during both sessions     ADL:   :     Therapy/Group: Individual Therapy  Joffrey Kerce A Rick Carruthers 12/29/2018, 4:36 PM

## 2018-12-29 NOTE — Plan of Care (Signed)
  Problem: RH SKIN INTEGRITY Goal: RH STG SKIN FREE OF INFECTION/BREAKDOWN Description Maintain skin integrity with minimal assist.   Outcome: Progressing Goal: RH STG MAINTAIN SKIN INTEGRITY WITH ASSISTANCE Description STG Maintain Skin Integrity With minimal  Assistance.  Outcome: Progressing   Problem: RH SAFETY Goal: RH STG ADHERE TO SAFETY PRECAUTIONS W/ASSISTANCE/DEVICE Description STG Adhere to Safety Precautions With minimal Assistance/Device.  Outcome: Progressing Goal: RH STG DECREASED RISK OF FALL WITH ASSISTANCE Description STG Decreased Risk of Fall With minimal Assistance.  Outcome: Progressing   Problem: RH KNOWLEDGE DEFICIT Goal: RH STG INCREASE KNOWLEDGE OF DIABETES Outcome: Progressing Goal: RH STG INCREASE KNOWLEDGE OF STROKE PROPHYLAXIS Outcome: Progressing

## 2018-12-29 NOTE — Progress Notes (Signed)
Social Work Patient ID: Robert Brewer, male   DOB: [birth year], 73 y.o.   MRN: [id number]   CSW met with pt and his wife (via telephone) to update them on team conference discussion and targeted d/c date of 01-14-19.  Wife was concerned how pt's insurance would continue to approve his CIR stay and CSW explained our process.  She and pt seemed relieved.  Wife plans to assist pt at home and feels she can provide up to min A, but is concerned about pt's size compared to hers, so she hopes he reaches S level goals.  No other concerns/needs/questions at this time.  CSW will continue to follow and assist as needed.

## 2018-12-29 NOTE — Patient Care Conference (Signed)
Inpatient RehabilitationTeam Conference and Plan of Care Update Date: 12/26/2018   Time: 10:25 AM    Patient Name: Robert Brewer      Medical Record Number: [id number]  Date of Birth: 01-08-1946 Sex: Male         Room/Bed: 4W22C/4W22C-01 Payor Info: Payor: [employer] MEDICARE / Plan: [employer] MEDICARE HMO / Product Type: *No Product type* /    Admitting Diagnosis: CVA 1 Team  Lt CVA; 22-24days  Admit Date/Time:  12/24/2018  6:04 PM Admission Comments: No comment available   Primary Diagnosis:  <principal problem not specified> Principal Problem: <principal problem not specified>  Patient Active Problem List   Diagnosis Date Noted  . Left pontine cerebrovascular accident ([place]) 12/24/2018  . Cerebral thrombosis with cerebral infarction (HCC) - L paramedian pontnine and L occipital, s/p  tPA   . S/P admn tPA in diff fac w/n last 24 hr bef adm to crnt fac   . Right hemiparesis ([place])   . Dysarthria   . Dysphagia, pharyngoesophageal phase   . Essential hypertension   . Patient non-compliant, refused service   . Non compliance w medication regimen   . Hyperglycemia   . Uncontrolled type 2 diabetes mellitus with hyperglycemia ([place])   . Hyperlipidemia LDL goal <70   . Ischemic cerebrovascular accident (CVA) ([place]) 12/17/2018    Expected Discharge Date: Expected Discharge Date: 01/14/19  Team Members Present: Physician leading conference: Dr. Alysia Penna Social Worker Present: Alfonse Alpers, LCSW Nurse Present: Other (comment)(Jamie Zenia Resides, RN) PT Present: Leavy Cella, PT;Rosita Dechalus, PTA OT Present: Darleen Crocker, OT SLP Present: Stormy Fabian, SLP PPS Coordinator present : Gunnar Fusi     Current Status/Progress Goal Weekly Team Focus  Medical   Patient with dysarthria but fair intelligibility, right-sided weakness moderately severe, blood pressure controlled  Maintain medical stability, reduce fall risk  Initiate therapy program   Bowel/Bladder              Swallow/Nutrition/ Hydration             ADL's   mod - max A overall  supervision overall  R NMR, self care retraining, balance, endurance   Mobility   mod to maxA bed mobilty, transfers, gait 27ft with wall rail  supervision overall  R NMR, balance, transfers, gait d/c planning   Communication   Min- Supervision sentence level  Mod I  carryover of speech startegies and awareness of errors, working blends, multisyllable words in sentence   Safety/Cognition/ Behavioral Observations            Pain             Skin              Rehab Goals Patient on target to meet rehab goals: Yes Rehab Goals Revised: none *See Care Plan and progress notes for long and short-term goals.     Barriers to Discharge  Current Status/Progress Possible Resolutions Date Resolved   Physician    Medical stability     Has completed initial evaluations  Continue rehabilitation program      Nursing  Other (comments)  impaired self care capabilities            PT  Decreased caregiver support  unsure if wife can provide physical assist at d/c              OT Other (comments)  none known at this time             SLP  SW                Discharge Planning/Teaching Needs:  Pt plans to return to his home with his wife to provide care for him as needed.  Wife can come for family education closer to d/c.   Team Discussion:  Pt with left pontine stroke, dysarthria, right sided weakness, but has no sensory deficits.  Pt's cognition is good.  Pt is having trouble with moving bowels and will need suppositories.  MD also watching blood sugars.  Pt is continent and does not have pain issues.  Pt using Miralax and may need sorbitol.  Pt is very motivated for all sessions.  He needs mod/max A for sit to stand and his knee buckles in standing.  Pt is mod /max A for bed mobility and max for transfers and gail of 30' at the wall.  He can propel the w/c.  He has supervision level goals.  ST is working on  intelligibility goals and needs mod verbal cues at the sentence level.  Has mod I speech goals.  Revisions to Treatment Plan:  none    Continued Need for Acute Rehabilitation Level of Care: The patient requires daily medical management by a physician with specialized training in physical medicine and rehabilitation for the following conditions: Daily direction of a multidisciplinary physical rehabilitation program to ensure safe treatment while eliciting the highest outcome that is of practical value to the patient.: Yes Daily medical management of patient stability for increased activity during participation in an intensive rehabilitation regime.: Yes Daily analysis of laboratory values and/or radiology reports with any subsequent need for medication adjustment of medical intervention for : Neurological problems   I attest that I was present, lead the team conference, and concur with the assessment and plan of the team.Team conference was held via web/ teleconference due to [place] - 19.   Jaiyana Canale, Silvestre Mesi 12/29/2018, 11:09 PM

## 2018-12-30 ENCOUNTER — Inpatient Hospital Stay (HOSPITAL_COMMUNITY): Payer: Medicare HMO

## 2018-12-30 ENCOUNTER — Inpatient Hospital Stay (HOSPITAL_COMMUNITY): Payer: Medicare HMO | Admitting: Occupational Therapy

## 2018-12-30 LAB — GLUCOSE, CAPILLARY
Glucose-Capillary: 129 mg/dL — ABNORMAL HIGH (ref 70–99)
Glucose-Capillary: 85 mg/dL (ref 70–99)
Glucose-Capillary: 69 mg/dL — ABNORMAL LOW (ref 70–99)
Glucose-Capillary: 239 mg/dL — ABNORMAL HIGH (ref 70–99)

## 2018-12-30 NOTE — Progress Notes (Signed)
Occupational Therapy Session Note  Patient Details  Name: Robert Brewer MRN: [id number] Date of Birth: [birth year]  Today's Date: 12/30/2018 OT Individual Time: 0902-1000 OT Individual Time Calculation (min): 58 min   Short Term Goals: Week 1:  OT Short Term Goal 1 (Week 1): Pt will perform UB dressing with min A to decrease level of assist with self care. OT Short Term Goal 2 (Week 1): Pt will perform toilet transfer with mod A.  OT Short Term Goal 3 (Week 1): Pt will perform toileting with mod A.  OT Short Term Goal 4 (Week 1): Pt will perform LB clothing management with mod A for standing balance.   Skilled Therapeutic Interventions/Progress Updates:    Pt greeted in w/c. Wanting to wait until tomorrow for shower. Declining to attempt toileting. He wanted to brush his teeth. Pt stood at sink to complete two grooming tasks and oral care. He required steady assist for standing balance and tactile cues for Rt knee extension. Afterwards, we continued to focus on Rt sided weightbearing and Rt knee control. Guided pt through squats while standing at sink. He held squatted position for 2 second holds to build strength in midrange. Active weightbearing through Rt hand when lowering and pushing up from sink. 3 sets of 10 reps completed. Manual and verbal cuing required for equal LE weightbearing during, as he'd offweight Rt side. Mod A for stand<sit for eccentric control when lowering. Next, pt completed seated heel slides with towel under foot. He completed 10 reps 2 sets. Pt required assist from OT or from Lt hand to flex knee completely. At end of session pt remained in w/c and was left with all needs within reach and chair alarm set.    Therapy Documentation Precautions:  Precautions Precautions: Fall Restrictions Weight Bearing Restrictions: No Pain: No c/o pain during tx   ADL:       Therapy/Group: Individual Therapy  Bohden Dung A Zyler Hyson 12/30/2018, 12:35 PM

## 2018-12-30 NOTE — Progress Notes (Signed)
Robert Brewer is a 73 y.o. male admitted for CIR following a left pontine infarction and left occipital infarction with right-sided weakness and dysarthria.  Status post TPA.  Patient has declined ILR    Subjective: No new complaints. No new problems. Slept well. Feeling OK.  No further hypoglycemia  Objective: Vital signs in last 24 hours: Temp:  [98.3 F (36.8 C)-98.6 F (37 C)] 98.3 F (36.8 C) (06/07 0512) Pulse Rate:  [59-65] 60 (06/07 0512) Resp:  [15-16] 15 (06/07 0512) BP: (119-130)/(58-74) 119/58 (06/07 0512) SpO2:  [95 %-96 %] 96 % (06/07 0512) Weight:  [97.2 kg] 97.2 kg (06/07 0512) Weight change:  Last BM Date: 12/29/18  Intake/Output from previous day: 06/06 0701 - 06/07 0700 In: 708 [P.O.:708] Out: 1150 [Urine:1150] Last cbgs: CBG (last 3)  Recent Labs    12/29/18 1649 12/29/18 2110 12/30/18 0621  GLUCAP 277* 249* 129*   Patient Vitals for the past 24 hrs:  BP Temp Temp src Pulse Resp SpO2 Weight  12/30/18 0512 (!) 119/58 98.3 F (36.8 C) Oral 60 15 96 % 97.2 kg  12/29/18 1939 126/67 98.6 F (37 C) Oral (!) 59 16 96 % -  12/29/18 1548 130/74 98.3 F (36.8 C) Oral 65 - 95 % -     Physical Exam General: No apparent distress   HEENT: not dry Lungs: Normal effort. Lungs clear to auscultation, no crackles or wheezes. Cardiovascular: Regular rate and rhythm, no edema; few ectopics noted.  No murmur noted today Abdomen: S/NT/ND; BS(+) Musculoskeletal:  unchanged Neurological: No new neurological deficits with right-sided weakness Wounds: N/A    Skin: clear   Mental state: Alert, oriented, cooperative    Lab Results: BMET    Component Value Date/Time   NA 138 12/25/2018 0712   K 3.9 12/25/2018 0712   CL 98 12/25/2018 0712   CO2 29 12/25/2018 0712   GLUCOSE 151 (H) 12/25/2018 0712   BUN 20 12/25/2018 0712   CREATININE 0.78 12/25/2018 0712   CALCIUM 9.2 12/25/2018 0712   GFRNONAA >60 12/25/2018 0712   GFRAA >60 12/25/2018 0712   CBC     Component Value Date/Time   WBC 9.3 12/25/2018 0712   RBC 4.95 12/25/2018 0712   HGB 14.7 12/25/2018 0712   HCT 42.5 12/25/2018 0712   PLT 214 12/25/2018 0712   MCV 85.9 12/25/2018 0712   MCH 29.7 12/25/2018 0712   MCHC 34.6 12/25/2018 0712   RDW 12.7 12/25/2018 0712   LYMPHSABS 2.0 12/25/2018 0712   MONOABS 0.7 12/25/2018 0712   EOSABS 0.2 12/25/2018 0712   BASOSABS 0.0 12/25/2018 0712     Medications: I have reviewed the patient's current medications.  Assessment/Plan:  Functional deficits following left pontine infarction.  Continue CIR Diabetes mellitus stable.  Continue basal bolus insulin therapy with SSI coverage Hypertension well-controlled off medications.  Continue to monitor    Length of stay, days: 6  Marletta Lor , MD 12/30/2018, 9:09 AM

## 2018-12-30 NOTE — Progress Notes (Signed)
Social Work Assessment and Plan  Patient Details  Name: Robert Brewer MRN: [id number] Date of Birth: [birth year]  Today's Date: 12/28/2018  Problem List:  Patient Active Problem List   Diagnosis Date Noted  . Left pontine cerebrovascular accident ([place]) 12/24/2018  . Cerebral thrombosis with cerebral infarction (HCC) - L paramedian pontnine and L occipital, s/p  tPA   . S/P admn tPA in diff fac w/n last 24 hr bef adm to crnt fac   . Right hemiparesis ([place])   . Dysarthria   . Dysphagia, pharyngoesophageal phase   . Essential hypertension   . Patient non-compliant, refused service   . Non compliance w medication regimen   . Hyperglycemia   . Uncontrolled type 2 diabetes mellitus with hyperglycemia ([place])   . Hyperlipidemia LDL goal <70   . Ischemic cerebrovascular accident (CVA) ([place]) 12/17/2018   Past Medical History: History reviewed. No pertinent past medical history. Past Surgical History: History reviewed. No pertinent surgical history. Social History:  reports that he has never smoked. He has never used smokeless tobacco. He reports that he does not drink alcohol. No history on file for drug.  Family / Support Systems Marital Status: Married How Long?: 27 years Patient Roles: Spouse, Other (Comment)(brother; friend; neighbor) Spouse/Significant Other: Lennie Vasco - wife - [phone] Other Supports: siblings, friends, neighbors Anticipated Caregiver: wife Ability/Limitations of Caregiver: min A Caregiver Availability: 24/7 Family Dynamics: close, supportive wife  Social History Preferred language: English Religion: Non-Denominational Read: Yes Write: Yes Employment Status: Retired Date Retired/Disabled/Unemployed:  years Age Retired: 66 Public relations account executive Issues: none reported Guardian/Conservator: N/A - MD has determined that pt is capable of making his own decisions.   Abuse/Neglect Abuse/Neglect Assessment Can Be Completed: Yes Physical Abuse:  Denies Verbal Abuse: Denies Sexual Abuse: Denies Exploitation of patient/patient's resources: Denies Self-Neglect: Denies  Emotional Status Pt's affect, behavior and adjustment status: Pt is emotionally labile as he talks about his family.  He is motivated to get better and to prevent future strokes. Recent Psychosocial Issues: none reported Psychiatric History: none reported Substance Abuse History: none reported  Patient / Family Perceptions, Expectations & Goals Pt/Family understanding of illness & functional limitations: Pt/wife have a good understanding of pt's condition and limitations. Premorbid pt/family roles/activities: Pt was active PTA with yardwork and helping his wife to take care of 30 cats. Anticipated changes in roles/activities/participation: Pt hopes to resume activities as he is able. Pt/family expectations/goals: Pt hopes to walk without assistance and assistive device and wants to regain use of his right arm.  Community Resources [employer] Scripts: None Premorbid Home Care/DME Agencies: None Transportation available at discharge: wife Resource referrals recommended: Neuropsychology, Support group (specify)(stroke support group)  Discharge Planning Living Arrangements: Spouse/significant other Support Systems: Spouse/significant other Type of Residence: Private residence Insurance Resources: Multimedia programmer (specify)([employer]) Museum/gallery curator Resources: Radio broadcast assistant Screen Referred: No Money Management: Patient, Spouse Does the patient have any problems obtaining your medications?: No Home Management: Pt and wife share these responsibilities.  Pt's brother has been helping wife take care of cats while pt has been in the hospital.  Patient/Family Preliminary Plans: Pt plans to return home at supervision level which wife can provide. Social Work Anticipated Follow Up Needs: HH/OP, Support Group Expected length of stay: 3 weeks  Clinical  Impression CSW met with pt and with wife via telephone to introduce self and role of CSW, as well as to complete assessment and offer support.  CSW explained insurance updates, as well.  Pt was emotional as CSW entered the room and he was talking to his sister via telephone.  His family makes him tearful as he is so grateful that he has them and that he lived through the stroke.  Pt is very determined to not have another stroke and to come back from this one.  He has good support from his wife and siblings and their spouses.  Pt was open to a visit from neuropsychologist.  Pt/wife with no other needs currently, but CSW will continue to follow and assist as needed.  Terez Freimark, Silvestre Mesi 12/30/2018, 9:54 AM

## 2018-12-30 NOTE — Progress Notes (Signed)
Physical Therapy Session Note  Patient Details  Name: Robert Brewer MRN: [id number] Date of Birth: 1946-06-11  Today's Date: 12/30/2018 PT Individual Time: 6060-0459 PT Individual Time Calculation (min): 58 min   Short Term Goals: Week 1:  PT Short Term Goal 1 (Week 1): Pt will complete all bed mobility with mod assist without hospital bed features. PT Short Term Goal 2 (Week 1): Pt will ambulate 50 ft with LRAD & mod assist. PT Short Term Goal 3 (Week 1): Pt will negotiate 8 steps with B rails & mod assist. PT Short Term Goal 4 (Week 1): Pt will consistently complete bed<>w/c transfers with min assist.  Skilled Therapeutic Interventions/Progress Updates:    Pt seated in w/c upon PT arrival, agreeable to therapy tx and denies pain. Pt propelled w/c to the gym and performed squat pivot to the mat with min assist. Pt performed sit<>stands from the mat this session without AD and mod assist. In standing pt worked on neuro re-ed and R knee control to perform x 10 mini squats with mirror for visual feedback focusing on midline and symmetric LE weightbearing. Pt performed x 5 sit<>stands for strengthening and neuro re-ed, cues for techniques and anterior weightshift, therapist providing manual facilitation for increased R LE weightbearing. Pt worked on pre-gait and R LE stance control to perform L LE stepping in place without AD, x 4 steps and then x 3 steps, mod-max assist with cues for R quad activation and manual facilitation for R lateral weightshift. Pt transferred to supine with supervision for R LE neuro re-education exercises including 2 x 10 of the following, therapist providing facilitation for proper movement and cues throughout: active assisted straight leg raises, SAQ with focus on eccentric control, heel slides and bridges with L LE more extended for increased weightbearing/use of R LE. Pt transferred back to sitting with supervision. Pt worked on standing balance without AD and increased R  sided weightbearing with 2 inch step under L foot while performing R sided reaching task for horseshoes, min assist for balance with cues for R knee extension/quad activation. Min assist stand pivot to w/c and propelled back to room with supervision, left with needs in reach and chair alarm set.   Therapy Documentation Precautions:  Precautions Precautions: Fall Restrictions Weight Bearing Restrictions: No   Therapy/Group: Individual Therapy  Netta Corrigan, PT, DPT 12/30/2018, 8:02 AM

## 2018-12-30 NOTE — Progress Notes (Signed)
[facility] Individual Statement of Services  Patient Name:  BREVAN LUBERTO  Date:  12/30/2018  Welcome to the [facility].  Our goal is to provide you with an individualized program based on your diagnosis and situation, designed to meet your specific needs.  With this comprehensive rehabilitation program, you will be expected to participate in at least 3 hours of rehabilitation therapies Monday-Friday, with modified therapy programming on the weekends.  Your rehabilitation program will include the following services:  Physical Therapy (PT), Occupational Therapy (OT), Speech Therapy (ST), 24 hour per day rehabilitation nursing, Neuropsychology, Case Management (Social Worker), Rehabilitation Medicine, Nutrition Services and Pharmacy Services  Weekly team conferences will be held on Wednesdays to discuss your progress.  Your Social Worker will talk with you frequently to get your input and to update you on team discussions.  Team conferences with you and your family in attendance may also be held.  Expected length of stay:  3 weeks  Overall anticipated outcome:  Supervision  Depending on your progress and recovery, your program may change. Your Social Worker will coordinate services and will keep you informed of any changes. Your Social Worker's name and contact numbers are listed  below.  The following services may also be recommended but are not provided by the [facility] will be made to provide these services after discharge if needed.  Arrangements include referral to agencies that provide these services.  Your insurance has been verified to be:  [employer] Your primary doctor is:  You do not have one, but hope to start going to your wife's doctor.  Pertinent information will be shared with your doctor and your  insurance company.  Social Worker:  Alfonse Alpers, LCSW  [phone] or [phone]  Information discussed with and copy given to patient by: Trey Sailors, 12/30/2018, 1:25 AM

## 2018-12-31 ENCOUNTER — Encounter (HOSPITAL_COMMUNITY): Payer: Medicare HMO | Admitting: Psychology

## 2018-12-31 ENCOUNTER — Inpatient Hospital Stay (HOSPITAL_COMMUNITY): Payer: Medicare HMO | Admitting: Physical Therapy

## 2018-12-31 ENCOUNTER — Inpatient Hospital Stay (HOSPITAL_COMMUNITY): Payer: Medicare HMO

## 2018-12-31 ENCOUNTER — Inpatient Hospital Stay (HOSPITAL_COMMUNITY): Payer: Medicare HMO | Admitting: Occupational Therapy

## 2018-12-31 DIAGNOSIS — F329 Major depressive disorder, single episode, unspecified: Secondary | ICD-10-CM

## 2018-12-31 LAB — GLUCOSE, CAPILLARY
Glucose-Capillary: 104 mg/dL — ABNORMAL HIGH (ref 70–99)
Glucose-Capillary: 76 mg/dL (ref 70–99)
Glucose-Capillary: 100 mg/dL — ABNORMAL HIGH (ref 70–99)
Glucose-Capillary: 144 mg/dL — ABNORMAL HIGH (ref 70–99)
Glucose-Capillary: 159 mg/dL — ABNORMAL HIGH (ref 70–99)

## 2018-12-31 MED ORDER — INSULIN GLARGINE 100 UNIT/ML ~~LOC~~ SOLN
32.0000 [IU] | Freq: Every day | SUBCUTANEOUS | Status: DC
  Administered 2019-01-01: 08:00:00 32 [IU] via SUBCUTANEOUS
  Filled 2018-12-31: qty 0.32

## 2018-12-31 NOTE — Consult Note (Signed)
Neuropsychological Consultation   Patient:   Robert Brewer   DOB:   1945-10-22  MR Number:  [id number]  Location:  [facility] [id number] [place] [address] Dept: [place]: [phone]           Date of Service:   12/31/2018  Start Time:   10 AM End Time:   11 AM  Provider/Observer:  Ilean Skill, Psy.D.       Clinical Neuropsychologist       Billing Code/Service: [phone] 4 Units  Chief Complaint:    Robert Brewer is Robert 73 year old male with history of diabetes, hypertension and medical noncompliance.  Presented to [facility] 5/25 with acute onset of right sided weakness, fall, dysarthria and facial droop after mowing grass and sitting down to rest and drink gator aid for Robert while.  Once got up was weak in both arms and legs rested more then got up to put away mower.  Fell in driveway, witnessed by wife, and husband pulled self to steps and EMS called.  CT showed no hemorrhage, Patient did receive TPA and transferred to [facility].  MRI confirms acute infarct left paramedian pons and small acute cortical infarct left occipital lobe.  Patient admitted for Robert comprehensive rehab program.    Reason for Service:  Robert Brewer Robert 73 year old right-handed male with history of diabetes mellitus, hypertension and medical noncompliance. Patient on no prescription medications. Per chart review lives with spouse. Independent prior to admission. 2 level home. Presented to [facility] 12/17/2018 with acute onset of right side weakness, dysarthria and facial droop while mowing the grass. CT of the head at outside hospital showed no hemorrhage. Patient did receive TPA and was transferred to [facility]. CT angiogram of head and neck showed 1 cm hypodensity involving the left paramedian pons and suspicious for evolving acute ischemic infarct. CTA head and neck with no large  vessel occlusion no significant intraluminal thrombus or dissection identified. MRI confirms acute infarct left paramedian pons small acute cortical infarct left occipital lobe. Negative for hemorrhage. Currently maintained on aspirin for CVA prophylaxis. Echocardiogram with ejection fraction of 94% normal systolic function. Negative bubble study. Recommendations hadbeen made for loop recorder placement and patient refused. Hemoglobin A1c 13.3 with insulin therapy as directed. Tolerating Robert regular consistency diet. Therapy evaluations completed and patient was admitted for Robert comprehensive rehab program  Current Status:  Patient has had times of significant emotional reaction with crying spells and feeling guilt etc thinking what he did to make stroke happen.  Patient concerned about wife and reports that wife is worried/distressed about his return home after rehab.    Behavioral Observation: Robert Brewer  presents as Robert 73 y.o.-year-old Right Caucasian Male who appeared his stated age. his dress was Appropriate and he was Well Groomed and his manners were Appropriate to the situation.  his participation was indicative of Appropriate and Attentive behaviors.  There were any physical disabilities noted.  he displayed an appropriate level of cooperation and motivation.     Interactions:    Active Appropriate and Attentive  Attention:   within normal limits and attention span and concentration were age appropriate  Memory:   within normal limits; recent and remote memory intact  Visuo-spatial:  not examined but there were some small infarct in occipital lobe.    Speech (Volume):  normal  Speech:   normal; slurred but improving  articulation  Thought Process:  Coherent and Relevant  Though Content:  WNL; not suicidal and not homicidal  Orientation:   person, place, time/date and situation  Judgment:   Good  Planning:   Good  Affect:    Depressed and  Labile  Mood:    Dysphoric  Insight:   Fair  Intelligence:   normal  Medical History:  History reviewed. No pertinent past medical history.  Psychiatric History:  Patient denies past psychiatric history.  Family Med/Psych History:  Family History  Problem Relation Age of Onset  . Other Mother        healthy, lived to her 65's  . Prostate cancer Father        lived to his 41's  . Congestive Heart Failure Father   . Valvular heart disease Sister   . Prostate cancer Brother     Risk of Suicide/Violence: low Patient denies SI or HI.  Impression/DX:  Robert Brewer is Robert 73 year old male with history of diabetes, hypertension and medical noncompliance.  Presented to [facility] 5/25 with acute onset of right sided weakness, fall, dysarthria and facial droop after mowing grass and sitting down to rest and drink gator aid for Robert while.  Once got up was weak in both arms and legs rested more then got up to put away mower.  Fell in driveway, witnessed by wife, and husband pulled self to steps and EMS called.  CT showed no hemorrhage, Patient did receive TPA and transferred to [facility].  MRI confirms acute infarct left paramedian pons and small acute cortical infarct left occipital lobe.  Patient admitted for Robert comprehensive rehab program.    Patient has had times of significant emotional reaction with crying spells and feeling guilt etc thinking what he did to make stroke happen.  Patient concerned about wife and reports that wife is worried/distressed about his return home after rehab.   Disposition/Plan:  Worked on coping and adjustment issues following Pons stroke.  Reactive depressive symptoms but patient is coping better with time and rehab.  Will see again later in week if needed.   Diagnosis:    Left pontine cerebrovascular accident [facility]) - Plan: Ambulatory referral to Neurology, CANCELED: Ambulatory referral to Physical Medicine Rehab         Electronically  Signed   _______________________ Ilean Skill, Psy.D.

## 2018-12-31 NOTE — Progress Notes (Signed)
Occupational Therapy Session Note  Patient Details  Name: Robert Brewer MRN: [id number] Date of Birth: 16-Feb-1946  Today's Date: 12/31/2018 OT Individual [phone] and  [id number] OT Individual Time Calculation (min): 73 min and 28 min  Short Term Goals: Week 1:  OT Short Term Goal 1 (Week 1): Pt will perform UB dressing with min A to decrease level of assist with self care. OT Short Term Goal 2 (Week 1): Pt will perform toilet transfer with mod A.  OT Short Term Goal 3 (Week 1): Pt will perform toileting with mod A.  OT Short Term Goal 4 (Week 1): Pt will perform LB clothing management with mod A for standing balance.   Skilled Therapeutic Interventions/Progress Updates:    Pt greeted on toilet, ready to transfer off of it. Mod A for stand pivot using grab bar back to w/c. He was amenable to complete BADL routine. Pt completed oral care/grooming tasks (standing at sink), bathing (sit<stand from shower chair), and dressing (w/c level sit<stand at sink) during session. All functional transfers completed with Mod A stand pivot. During shower, pt used one handed techniques for washing Lt side. HOH for integrating Rt hand when washing upper legs. Min A for sit<stand with Lt hand maintaining Rt grip on grab bar. Mod A for standing balance with OT keeping Rt hand on grab bar. He had 1 small LOB when completing perihygiene, needing Mod A to recover. During dressing tasks, pt required increased time and vcs for joint protection when managing affected arm. Pt bent forward to feet and also utilized figure 4 position to don LB clothing/footwear. Supervision for dynamic sitting balance at this time with no LOBs. He required Min A to button his pants due to tightness. Min-Mod A for dynamic standing balance when he attempted to button himself. During oral care/grooming tasks, Min A for keeping Rt hand weightbearing sink (as it will slide off). Assist also for using affected side as gross stabilizer throughout.  Pt was motivated to complete 10 mini squats at sink, and he did so with Min A for balance, Rt knee supported, and assist for keeping Rt hand weightbearing sink. He was left in w/c with all needs within reach and chair alarm set. Tx focus placed on adaptive self care skills, dynamic standing balance/equal LE weightbearing, Rt NMR, and functional transfers.    2nd Session 1:1 tx (28 min) Pt greeted in w/c with no c/o pain. Wanting to do his laundry. First he stood at the sink to wash his hands. Min A for sit<stand and Mod A for standing balance. Manual cuing for increasing weightbearing through Rt side. Pt then self propelled down to the laundry room. The same assist required for sit<stand and standing balance while loading his laundry. Had pt reach towards Rt to increase WB through Rt hand on washer, and to facilitate Rt weightshifting. To work on activity tolerance, pt self propelled to supply closet using hemi technique. OT provided a fitted Rt ELR to w/c. When trialing ELRs, had pt work on [place] without assist from [place]. He then self propelled back to his room. Pt with more active digit movement in Rt hand at this date. Provided him with a yellow foam sponge. Educated pt to work on controlled grasp/release to promote balanced muscle activation. He was left in w/c with all needs within reach and chair alarm set.   Therapy Documentation Precautions:  Precautions Precautions: Fall Restrictions Weight Bearing Restrictions: No Vital Signs: Therapy Vitals Temp: 98.9  F (37.2 C) Temp Source: Oral Pulse Rate: (!) 59 Resp: 17 BP: 128/73 Patient Position (if appropriate): Sitting Oxygen Therapy SpO2: 93 % O2 Device: Room Air Pain: No c/o pain during session  Pain Assessment Pain Scale: 0-10 Pain Score: 0-No pain ADL:       Therapy/Group: Individual Therapy  Carl Butner A Elvie Palomo 12/31/2018, 3:56 PM

## 2018-12-31 NOTE — Progress Notes (Signed)
[facility] PHYSICAL MEDICINE & REHABILITATION PROGRESS NOTE   Subjective/Complaints:  No issues overnite , mild RIght shoulder pain, hx dislocation in past, pain with certain movements but not at rest   Review of systems denies chest pain shortness of breath, nausea vomiting diarrhea, positive for constipation as noted, negative for abdominal pain  Objective:   No results found. No results for input(s): WBC, HGB, HCT, PLT in the last 72 hours. No results for input(s): NA, K, CL, CO2, GLUCOSE, BUN, CREATININE, CALCIUM in the last 72 hours.  Intake/Output Summary (Last 24 hours) at 12/31/2018 0826 Last data filed at 12/31/2018 0734 Gross per 24 hour  Intake 954 ml  Output 1100 ml  Net -146 ml     Physical Exam: Vital Signs Blood pressure (!) 122/59, pulse 60, temperature 98 F (36.7 C), temperature source Oral, resp. rate 16, height 5\' 11"  (1.803 m), weight 97.3 kg, SpO2 96 %.   General: No acute distress Mood and affect are appropriate Heart: Regular rate and rhythm no rubs murmurs or extra sounds Lungs: Clear to auscultation, breathing unlabored, no rales or wheezes Abdomen: Positive bowel sounds, soft nontender to palpation, nondistended Extremities: No clubbing, cyanosis, or edema Skin: No evidence of breakdown, no evidence of rash Neurologic: Cranial nerves II through XII intact, motor strength is 5/5 in LEFT  deltoid, bicep, tricep, grip, hip flexor, knee extensors, ankle dorsiflexor and plantar flexor 0/5 RUE, 3- Left hip add trace hip/knee ext synergy Sensory exam normal sensation to light touch and proprioception in bilateral upper and lower extremities Cerebellar exam normal finger to nose to finger as well as heel to shin in bilateral upper and lower extremities Musculoskeletal: Full range of motion in all 4 extremities. No joint swelling   Assessment/Plan: 1. 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 which require 3+ hours per day of  interdisciplinary therapy in a comprehensive inpatient rehab setting.  Physiatrist is providing close team supervision and 24 hour management of active medical problems listed below.  Physiatrist and rehab team continue to assess barriers to discharge/monitor patient progress toward functional and medical goals  Care Tool:  Bathing  Bathing activity did not occur: Refused Body parts bathed by patient: Left arm, Chest, Abdomen, Front perineal area, Right upper leg, Left upper leg, Face, Right arm, Buttocks, Right lower leg, Left lower leg   Body parts bathed by helper: Right arm, Buttocks, Right lower leg, Left lower leg     Bathing assist Assist Level: Minimal Assistance - Patient > 75%     Upper Body Dressing/Undressing Upper body dressing   What is the patient wearing?: Pull over shirt    Upper body assist Assist Level: Contact Guard/Touching assist    Lower Body Dressing/Undressing Lower body dressing      What is the patient wearing?: Underwear/pull up, Pants     Lower body assist Assist for lower body dressing: Maximal Assistance - Patient 25 - 49%     Toileting Toileting Toileting Activity did not occur (Clothing management and hygiene only): Refused  Toileting assist Assist for toileting: Moderate Assistance - Patient 50 - 74%     Transfers Chair/bed transfer  Transfers assist  Chair/bed transfer activity did not occur: Safety/medical concerns  Chair/bed transfer assist level: Moderate Assistance - Patient 50 - 74%     Locomotion Ambulation   Ambulation assist   Ambulation activity did not occur: Safety/medical concerns  Assist level: Moderate Assistance - Patient 50 - 74% Assistive device: Walker-rolling Max distance: 69'  Walk 10 feet activity   Assist  Walk 10 feet activity did not occur: Safety/medical concerns  Assist level: Moderate Assistance - Patient - 50 - 74% Assistive device: Walker-rolling   Walk 50 feet activity   Assist  Walk 50 feet with 2 turns activity did not occur: Safety/medical concerns  Assist level: Moderate Assistance - Patient - 50 - 74% Assistive device: Walker-rolling    Walk 150 feet activity   Assist Walk 150 feet activity did not occur: Safety/medical concerns         Walk 10 feet on uneven surface  activity   Assist Walk 10 feet on uneven surfaces activity did not occur: Safety/medical concerns         Wheelchair     Assist Will patient use wheelchair at discharge?: No Type of Wheelchair: Manual    Wheelchair assist level: Supervision/Verbal cueing Max wheelchair distance: 100'    Wheelchair 50 feet with 2 turns activity    Assist        Assist Level: Supervision/Verbal cueing   Wheelchair 150 feet activity     Assist     Assist Level: Supervision/Verbal cueing    Medical Problem List and Plan: 1.Right side weakness with facial droop and dysarthriasecondary to 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 Status post TPA. Patient refused loop recorder CIR PT, OT. SLP  2. Antithrombotics: -DVT/anticoagulation:SCDs -antiplatelet therapy: Aspirin 81 mg daily 3. Pain Management:Tylenol as needed 4. Mood:Provide emotional support -antipsychotic agents: N/A 5. Neuropsych: This patientiscapable of making decisions on hisown behalf. 6. Skin/Wound Care:Routine skin checks 7. Fluids/Electrolytes/Nutrition:Routine in and outs with follow-up chemistries -encourage PO 8.Diabetes mellitus. Hemoglobin A1c 13.3. Novolog7units TID,Lantus insulin 30units daily.  CBG (last 3)  Recent Labs    12/30/18 1724 12/30/18 2118 12/31/18 0620  GLUCAP 69* 239* 104*  Lantus dose 34U still on low side will reduce slightly to 32U  -diabetic education 9. Permissive Hypertension. Monitor with increased mobility. Patient on no prescription medications prior to  admission Vitals:   12/30/18 1922 12/31/18 0507  BP: (!) 123/59 (!) 122/59  Pulse: 62 60  Resp: 16 16  Temp: 98.2 F (36.8 C) 98 F (36.7 C)  SpO2: 95% 96%  Controlled 6/8 10.Medical noncompliance. Counseling 11.  Constipation improved, >1BM per day   LOS: 7 days A FACE TO FACE EVALUATION WAS PERFORMED  [name] 12/31/2018, 8:26 AM

## 2018-12-31 NOTE — Progress Notes (Signed)
Speech Language Pathology Weekly Progress and Session Note  Patient Details  Name: Robert Brewer MRN: [id number] Date of Birth: February 06, 1946  Beginning of progress report period: December 24, 2018 End of progress report period: December 31, 2018  Today's Date: 12/31/2018 SLP Individual Time: 6579-0383 SLP Individual Time Calculation (min): 57 min  Short Term Goals: Week 1: SLP Short Term Goal 1 (Week 1): Pt will utilize speech intelligibility strategies at sentence level with supervision A verbal cues to achieve 80% intelligibilty. SLP Short Term Goal 1 - Progress (Week 1): Met SLP Short Term Goal 2 (Week 1): Pt will self-monitor and self-correct verbal speech errors with min A verbal cues.  SLP Short Term Goal 2 - Progress (Week 1): Met    New Short Term Goals: Week 2: SLP Short Term Goal 1 (Week 2): Pt will utilize speech intelligibility strategies at conversation level with supervision A verbal cues to achieve 85% intelligibilty. SLP Short Term Goal 2 (Week 2): Pt will self-monitor and self-correct verbal speech errors with supervision A verbal cues.   Weekly Progress Updates: Pt demonstrated great progress meeting 2 out 2 goals. Pt demonstrated ability to recall speech intelligibility strategies and utilize at a sentence level. Pt currently demonstrates 90% intelligibility at sentence level with supervision A verbal cues for over-articulation and to slow rate, while in conversation 80% intelligibility with min A verbal cues for strategies listed above. Pt demonstrated increase ability to correct verbal errors. Pt would continue to benefit from skilled ST services in order to maximize functional communication.      Intensity: Minumum of 1-2 x/day, 30 to 90 minutes Frequency: 1 to 3 out of 7 days Duration/Length of Stay: 6/22  Treatment/Interventions: Cueing hierarchy;Speech/Language facilitation;Patient/family education   Daily Session  Skilled Therapeutic Interventions:  Skilled ST  services focused on speech skills. SLP facilitated use of speech intelligibility strategies at sentence level, while creating complex sentence given multi-syllable (2-4 syllables) nouns, pt demonstrated 90% intelligibility with supervision A verbal cues. During informal conversation pt demonstrated 80% intelligibility with min A verbal cues in mildly nosy environment. Pt was left in room with call bell within reach and chair alarm set. ST recommends to continue skilled ST services.     General    Pain Pain Assessment Pain Scale: 0-10 Pain Score: 0-No pain  Therapy/Group: Individual Therapy  Lorina Duffner  [facility] 12/31/2018, 2:53 PM

## 2018-12-31 NOTE — Progress Notes (Signed)
Physical Therapy Session Note  Patient Details  Name: Robert Brewer MRN: [id number] Date of Birth: 1946/05/01  Today's Date: 12/31/2018 PT Individual Time: 0915-1000 PT Individual Time Calculation (min): 45 min   Short Term Goals: Week 1:  PT Short Term Goal 1 (Week 1): Pt will complete all bed mobility with mod assist without hospital bed features. PT Short Term Goal 2 (Week 1): Pt will ambulate 50 ft with LRAD & mod assist. PT Short Term Goal 3 (Week 1): Pt will negotiate 8 steps with B rails & mod assist. PT Short Term Goal 4 (Week 1): Pt will consistently complete bed<>w/c transfers with min assist.  Skilled Therapeutic Interventions/Progress Updates:    Pt received seated in w/c in room, agreeable to PT session. No complaints of pain. Manual w/c propulsion 2 x 150 ft with use of L UE/LE and Supervision. Stand pivot transfer w/c to/from mat table with min A. Sit to stand from mat table with no AD and min A. Session focus on RLE NMR with use of mirror for visual feedback. Sit to stand x 5 reps with no AD and min A with focus on eccentric control when sitting and equal WBing through BLE, pt tends to WB more through LLE. Sit to stand x 5 reps with no AD and min A with 1" block under LLE to encourage weight shift onto RLE. Standing mini-squats x 10 reps with focus on equal WBing through BLE, min to mod manual cueing through RLE for quad control. Alt L/R 1" step taps with RW and min A, mod manual cueing at R knee for control. Pt left seated in w/c in room with needs in reach, chair alarm in place at end of session.  Therapy Documentation Precautions:  Precautions Precautions: Fall Restrictions Weight Bearing Restrictions: No    Therapy/Group: Individual Therapy   Excell Seltzer, PT, DPT  12/31/2018, 11:10 AM

## 2018-12-31 NOTE — Progress Notes (Signed)
Physical Therapy Session Note  Patient Details  Name: Robert Brewer MRN: [id number] Date of Birth: 1946/02/27  Today's Date: 12/31/2018 PT Individual Time: 9833-8250 PT Individual Time Calculation (min): 40 min   Short Term Goals: Week 1:  PT Short Term Goal 1 (Week 1): Pt will complete all bed mobility with mod assist without hospital bed features. PT Short Term Goal 2 (Week 1): Pt will ambulate 50 ft with LRAD & mod assist. PT Short Term Goal 3 (Week 1): Pt will negotiate 8 steps with B rails & mod assist. PT Short Term Goal 4 (Week 1): Pt will consistently complete bed<>w/c transfers with min assist.  Skilled Therapeutic Interventions/Progress Updates:  Pt received in w/c & agreeable to tx. Pt reports a little chronic pain in R shoulder but reports he does not need pain meds. Pt propels w/c room<>gym with L hemi technique and supervision. Pt requires cuing for safe placement of w/c for transfer to mat table and requires mod assist for stand pivot as pt demonstrates impaired eccentric control with descent. Pt reports feeling somewhat lightheaded 2/2 low blood glucose & requests a coke, RN approves & pt consumes. Pt transfers sit>stand from EOM with min cuing for hand placement and attempts to ambulate with RW and R hand orthosis with mod assist. Pt demonstrates decreased ability to dorsiflex RLE & impaired hip/knee flexion to advance extremity. During task pt reports feeling lightheaded so returned to sitting & vitals checked: BP = 126/74 mmHg, HR = 57 bpm. Pt returned to room & RN checked blood glucose level which was WNL. Pt engaged in 2 sets of 5x sit<>stand with mod assist with cuing and focus for anterior/R weight shifting to weight bear mostly through RLE with task focusing on RLE strengthening & NMR. Pt experienced 1 LOB at end of second set requiring mod/max assist to correct 2/2 pt fatigue. Pt transfers sit<>stand with min assist from w/c, therapist reviewed managing RUE on hand orthosis  with pt assisting with task. Pt ambulates w/c<>door & back x 2 trials with RW & min assist with impaired coordination & neuromuscular control RLE but pt with some safety awareness as he's able to reposition RLE in stable position before advancing RLE, therapist also provides cuing for increased step width RLE. Pt does present with R knee A/P instability during stance phase. At end of session pt left sitting in w/c with chair alarm donned & all needs in reach.   Therapy Documentation Precautions:  Precautions Precautions: Fall Restrictions Weight Bearing Restrictions: No    Therapy/Group: Individual Therapy  [name] 12/31/2018, 12:38 PM

## 2019-01-01 ENCOUNTER — Inpatient Hospital Stay (HOSPITAL_COMMUNITY): Payer: Medicare HMO | Admitting: Physical Therapy

## 2019-01-01 ENCOUNTER — Inpatient Hospital Stay (HOSPITAL_COMMUNITY): Payer: Medicare HMO | Admitting: Occupational Therapy

## 2019-01-01 LAB — GLUCOSE, CAPILLARY
Glucose-Capillary: 93 mg/dL (ref 70–99)
Glucose-Capillary: 137 mg/dL — ABNORMAL HIGH (ref 70–99)
Glucose-Capillary: 103 mg/dL — ABNORMAL HIGH (ref 70–99)
Glucose-Capillary: 118 mg/dL — ABNORMAL HIGH (ref 70–99)
Glucose-Capillary: 178 mg/dL — ABNORMAL HIGH (ref 70–99)

## 2019-01-01 MED ORDER — INSULIN GLARGINE 100 UNIT/ML ~~LOC~~ SOLN
30.0000 [IU] | Freq: Every day | SUBCUTANEOUS | Status: DC
  Administered 2019-01-02 – 2019-01-16 (×15): 30 [IU] via SUBCUTANEOUS
  Filled 2019-01-01 (×16): qty 0.3

## 2019-01-01 NOTE — Progress Notes (Signed)
Occupational Therapy Session Note  Patient Details  Name: Robert Brewer MRN: [id number] Date of Birth: 01-Feb-1946  Today's Date: 01/01/2019 OT Individual Time: 9826-4158 OT Individual Time Calculation (min): 43 min    Short Term Goals: Week 1:  OT Short Term Goal 1 (Week 1): Pt will perform UB dressing with min A to decrease level of assist with self care. OT Short Term Goal 2 (Week 1): Pt will perform toilet transfer with mod A.  OT Short Term Goal 3 (Week 1): Pt will perform toileting with mod A.  OT Short Term Goal 4 (Week 1): Pt will perform LB clothing management with mod A for standing balance.   Skilled Therapeutic Interventions/Progress Updates:    Upon entering the room, pt seated in wheelchair awaiting OT arrival. Pt propelled wheelchair with supervision and utilizing hemiplegic technique 150' to day room. OT attempting to give pt target of resistive peg in board for gross grasp with R hand . Pt is able to grasp but not pull from board without assistance and unable to release. OT provided pt with yellow resistive theraputty to provide some visual input of weight bearing through R UE. Stand pivot transfer onto matt from wheelchair with mod A for safety. Sit >supine with min A and focus on DNF movement patterns with R UE with gravity eliminated. Pt performed x 7 reps with L UE placing R UE and pt working to control and move hand to R pocket. Pt required rest break after 7 reps secondary to muscle fatigue. Pt returning back to wheelchair in same manner as above and propelled wheelchair back to room. Chair alarm activated and all needs within reach.   Therapy Documentation Precautions:  Precautions Precautions: Fall Restrictions Weight Bearing Restrictions: No General:   Vital Signs: Therapy Vitals Temp: 98.3 F (36.8 C) Temp Source: Oral Pulse Rate: 61 Resp: 18 BP: 113/75 Patient Position (if appropriate): Sitting Oxygen Therapy SpO2: 97 % O2 Device: Room  Air   Therapy/Group: Individual Therapy  [name] 01/01/2019, 3:51 PM

## 2019-01-01 NOTE — Progress Notes (Signed)
[facility] PHYSICAL MEDICINE & REHABILITATION PROGRESS NOTE   Subjective/Complaints:  No issues overnite   Review of systems denies chest pain shortness of breath, nausea vomiting diarrhea, positive for constipation as noted, negative for abdominal pain  Objective:   No results found. No results for input(s): WBC, HGB, HCT, PLT in the last 72 hours. No results for input(s): NA, K, CL, CO2, GLUCOSE, BUN, CREATININE, CALCIUM in the last 72 hours.  Intake/Output Summary (Last 24 hours) at 01/01/2019 0814 Last data filed at 01/01/2019 0643 Gross per 24 hour  Intake 834 ml  Output 1000 ml  Net -166 ml     Physical Exam: Vital Signs Blood pressure 127/70, pulse (!) 45, temperature 98.2 F (36.8 C), resp. rate 18, height 5\' 11"  (1.803 m), weight 94.5 kg, SpO2 94 %.   General: No acute distress Mood and affect are appropriate Heart: Regular rate and rhythm no rubs murmurs or extra sounds Lungs: Clear to auscultation, breathing unlabored, no rales or wheezes Abdomen: Positive bowel sounds, soft nontender to palpation, nondistended Extremities: No clubbing, cyanosis, or edema Skin: No evidence of breakdown, no evidence of rash Neurologic: Cranial nerves II through XII intact, motor strength is 5/5 in LEFT  deltoid, bicep, tricep, grip, hip flexor, knee extensors, ankle dorsiflexor and plantar flexor 2- RIght biceps and finger flexors, 3- Left hip add trace hip/knee ext synergy  Cerebellar exam normal finger to nose to finger as well as heel to shin in left upper and lower extremities Musculoskeletal: Full range of motion in all 4 extremities. No joint swelling   Assessment/Plan: 1. 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  Physiatrist is providing close team supervision and 24 hour management of active medical problems listed below.  Physiatrist and rehab  team continue to assess barriers to discharge/monitor patient progress toward functional and medical goals  Care Tool:  Bathing  Bathing activity did not occur: Refused Body parts bathed by patient: Left arm, Chest, Abdomen, Front perineal area, Right upper leg, Left upper leg, Face, Right arm, Buttocks, Right lower leg, Left lower leg   Body parts bathed by helper: Right arm, Buttocks, Right lower leg, Left lower leg     Bathing assist Assist Level: Minimal Assistance - Patient > 75%     Upper Body Dressing/Undressing Upper body dressing   What is the patient wearing?: Pull over shirt    Upper body assist Assist Level: Supervision/Verbal cueing    Lower Body Dressing/Undressing Lower body dressing      What is the patient wearing?: Pants     Lower body assist Assist for lower body dressing: Minimal Assistance - Patient > 75%     Toileting Toileting Toileting Activity did not occur (Clothing management and hygiene only): Refused  Toileting assist Assist for toileting: Moderate Assistance - Patient 50 - 74%     Transfers Chair/bed transfer  Transfers assist  Chair/bed transfer activity did not occur: Safety/medical concerns  Chair/bed transfer assist level: Moderate Assistance - Patient 50 - 74%     Locomotion Ambulation   Ambulation assist   Ambulation activity did not occur: Safety/medical concerns  Assist level: Minimal Assistance - Patient > 75% Assistive device: Walker-rolling(R hand orthosis) Max distance: 12 ft   Walk 10 feet activity   Assist  Walk 10 feet activity did not occur: Safety/medical concerns  Assist level: Minimal Assistance - Patient > 75% Assistive device: Walker-rolling(R hand  orthosis)   Walk 50 feet activity   Assist Walk 50 feet with 2 turns activity did not occur: Safety/medical concerns  Assist level: Moderate Assistance - Patient - 50 - 74% Assistive device: Walker-rolling    Walk 150 feet activity   Assist Walk  150 feet activity did not occur: Safety/medical concerns         Walk 10 feet on uneven surface  activity   Assist Walk 10 feet on uneven surfaces activity did not occur: Safety/medical concerns         Wheelchair     Assist Will patient use wheelchair at discharge?: No Type of Wheelchair: Manual    Wheelchair assist level: Supervision/Verbal cueing Max wheelchair distance: 150'    Wheelchair 50 feet with 2 turns activity    Assist        Assist Level: Supervision/Verbal cueing   Wheelchair 150 feet activity     Assist     Assist Level: Supervision/Verbal cueing    Medical Problem List and Plan: 1.Right side weakness with facial droop and dysarthriasecondary to 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 Status post TPA. Patient refused loop recorder CIR PT, OT. SLP  2. Antithrombotics: -DVT/anticoagulation:SCDs -antiplatelet therapy: Aspirin 81 mg daily 3. Pain Management:Tylenol as needed 4. Mood:Provide emotional support -antipsychotic agents: N/A 5. Neuropsych: This patientiscapable of making decisions on hisown behalf. 6. Skin/Wound Care:Routine skin checks 7. Fluids/Electrolytes/Nutrition:Routine in and outs with follow-up chemistries -encourage PO 8.Diabetes mellitus. Hemoglobin A1c 13.3. Novolog7units TID,Lantus insulin 30units daily.  CBG (last 3)  Recent Labs    12/31/18 1649 12/31/18 2123 01/01/19 0642  GLUCAP 144* 159* 93  Lantus dose 34U still on low side will reduce slightly to 32U .  AM CBG below 100 will reduce lantus to 30U  -diabetic education 9. Permissive Hypertension. Monitor with increased mobility. Patient on no prescription medications prior to admission Vitals:   12/31/18 1948 01/01/19 0516  BP: 118/73 127/70  Pulse: (!) 58 (!) 45  Resp: 18 18  Temp: 98.4 F (36.9 C) 98.2 F (36.8 C)  SpO2: 96% 94%   Controlled 6/8 10.Medical noncompliance. Counseling 11.  Constipation improved, >1BM per day   LOS: 8 days A FACE TO FACE EVALUATION WAS PERFORMED  Robert Brewer 01/01/2019, 8:14 AM

## 2019-01-01 NOTE — Plan of Care (Signed)
  Problem: RH SKIN INTEGRITY Goal: RH STG SKIN FREE OF INFECTION/BREAKDOWN Description Maintain skin integrity with minimal assist.   Outcome: Progressing Goal: RH STG MAINTAIN SKIN INTEGRITY WITH ASSISTANCE Description STG Maintain Skin Integrity With minimal  Assistance.  Outcome: Progressing   Problem: RH SAFETY Goal: RH STG ADHERE TO SAFETY PRECAUTIONS W/ASSISTANCE/DEVICE Description STG Adhere to Safety Precautions With minimal Assistance/Device.  Outcome: Progressing Goal: RH STG DECREASED RISK OF FALL WITH ASSISTANCE Description STG Decreased Risk of Fall With minimal Assistance.  Outcome: Progressing   Problem: RH KNOWLEDGE DEFICIT Goal: RH STG INCREASE KNOWLEDGE OF DIABETES Outcome: Progressing Goal: RH STG INCREASE KNOWLEDGE OF STROKE PROPHYLAXIS Outcome: Progressing

## 2019-01-01 NOTE — Significant Event (Signed)
Pt refused meal coverage insulin this am stating "it drops my sugar". Dr Letta Pate made aware. Continue plan of care.   Gerald Stabs, RN

## 2019-01-01 NOTE — Progress Notes (Signed)
Physical Therapy Weekly Progress Note  Patient Details  Name: Robert Brewer MRN: [id number] Date of Birth: 1946/02/08  Beginning of progress report period: December 25, 2018 End of progress report period: January 01, 2019  Today's Date: 01/01/2019  Patient has met 3 of 4 short term goals.  Pt has demonstrated good progress towards goals this week. Pt is demonstrating an improving awareness towards deficits and has made improvements in functional transfers, bed mobility, and gait.   Patient continues to demonstrate the following deficits muscle weakness and muscle paralysis, decreased cardiorespiratoy endurance, impaired timing and sequencing, abnormal tone, unbalanced muscle activation, ataxia and decreased coordination, decreased motor planning and decreased sitting balance, decreased standing balance, decreased postural control and decreased balance strategies and therefore will continue to benefit from skilled PT intervention to increase functional independence with mobility.  Patient progressing toward long term goals.  Continue plan of care.  PT Short Term Goals Week 1:  PT Short Term Goal 1 (Week 1): Pt will complete all bed mobility with mod assist without hospital bed features. PT Short Term Goal 1 - Progress (Week 1): Met PT Short Term Goal 2 (Week 1): Pt will ambulate 50 ft with LRAD & mod assist. PT Short Term Goal 2 - Progress (Week 1): Met PT Short Term Goal 3 (Week 1): Pt will negotiate 8 steps with B rails & mod assist. PT Short Term Goal 3 - Progress (Week 1): Progressing toward goal PT Short Term Goal 4 (Week 1): Pt will consistently complete bed<>w/c transfers with min assist. PT Short Term Goal 4 - Progress (Week 1): Met Week 2:  PT Short Term Goal 1 (Week 2): Pt will perform bed mobility with no more than min assist WITHOUT bed features PT Short Term Goal 2 (Week 2): Pt will perform bed<>chair transfers with CGA using LRAD PT Short Term Goal 3 (Week 2): Pt will ambulate 27f  using LRAD with min assist PT Short Term Goal 4 (Week 2): Pt will ascend/descend 4 steps using handrails with min assist   Rosita DeChalus, PTA Sharlyne Koeneman, PT, DPT 01/01/2019, 11:38 AM

## 2019-01-01 NOTE — Progress Notes (Signed)
Physical Therapy Session Note  Patient Details  Name: Robert Brewer MRN: [id number] Date of Birth: 1946/01/17  Today's Date: 01/01/2019 PT Individual Time: [phone] and 3128-1188 PT Individual Time Calculation (min): 75 min and 40 min  Short Term Goals: Week 1:  PT Short Term Goal 1 (Week 1): Pt will complete all bed mobility with mod assist without hospital bed features. PT Short Term Goal 1 - Progress (Week 1): Met PT Short Term Goal 2 (Week 1): Pt will ambulate 50 ft with LRAD & mod assist. PT Short Term Goal 2 - Progress (Week 1): Met PT Short Term Goal 3 (Week 1): Pt will negotiate 8 steps with B rails & mod assist. PT Short Term Goal 3 - Progress (Week 1): Progressing toward goal PT Short Term Goal 4 (Week 1): Pt will consistently complete bed<>w/c transfers with min assist. PT Short Term Goal 4 - Progress (Week 1): Met Week 2:     Skilled Therapeutic Interventions/Progress Updates: Tx1: Pt presented in bed agreeable to therapy and denies pain throughout session. Pt performed supine to sit with use of bed features CGA. Pt performed squat pivot transfer to w/c CGA. Pt propelled to day room supervision level. Session focused on gait activity with use of Lite Gait. Pt participated in Lite Gait activity with ace bandage on R foot to maintain neutral DF. Pt ambulated x 75 ft at .03MPH with PTA providing manual facilitation of increasing step length, increasing wt shift to R to promote L foot clearance, tactile cues for maintaining quad activation. After rest pt was able to increase gait speed to .04MPH for additional 64f with improved carryover and maintaining increased step length. After gait activity pt propelled to rehab gym and performed gait with RW rx 30 ft requring minA with pt demonstrating improving wt shift to R and swing through with ace bandage. PTA then trialed R AFO with lateral support, pt ambulated additional 162fwith minA to w/c. Pt propelled back to room and remained in  w/c. Pt left with seat alarm on, call bell within reach and needs met.   Tx2: Pt presented in w/c agreeable to therapy. Session continued to focus on gait with increased use of RLE. Pt propelled to day room and participated in gait training with RW. Pt ambulated 3066fith RW and minA with verbal cues for increasing BOS and placing RLE towards wheel of RW. After seated rest pt ambulated additional 75f54fth minA. Pt noted to have increased R knee instability with fatigue however no LOB. Pt ambulated to [place] participated in standing cycles at 40cm/sec 3 bouts of 15 for increased use of RLE and focus on achieving full extension of RLE. Pt also performed STS on K[place] for emphasis on maintaining = wt bearing upon standing. Performed ambulatory transfer back to w/c with RW and propelled self back to room. Pt left with seat alarm on, call bell within reach and needs met.       Therapy Documentation Precautions:  Precautions Precautions: Fall Restrictions Weight Bearing Restrictions: No General:   Vital Signs: Therapy Vitals Temp: 98.3 F (36.8 C) Temp Source: Oral Pulse Rate: 61 Resp: 18 BP: 113/75 Patient Position (if appropriate): Sitting Oxygen Therapy SpO2: 97 % O2 Device: Room Air Pain:   Mobility:   Locomotion :    Trunk/Postural Assessment :    Balance:   Exercises:   Other Treatments:      Therapy/Group: Individual Therapy  Rockell Faulks 01/01/2019, 4:26 PM

## 2019-01-01 NOTE — Progress Notes (Signed)
Physical Therapy Session Note  Patient Details  Name: Robert Brewer MRN: [id number] Date of Birth: 27-Jul-1945  Today's Date: 01/01/2019 PT Individual Time: 1005-1049 PT Individual Time Calculation (min): 44 min   Short Term Goals: Week 1:  PT Short Term Goal 1 (Week 1): Pt will complete all bed mobility with mod assist without hospital bed features. PT Short Term Goal 2 (Week 1): Pt will ambulate 50 ft with LRAD & mod assist. PT Short Term Goal 3 (Week 1): Pt will negotiate 8 steps with B rails & mod assist. PT Short Term Goal 4 (Week 1): Pt will consistently complete bed<>w/c transfers with min assist.  Skilled Therapeutic Interventions/Progress Updates:    Pt received sitting in w/c and agreeable to therapy session. Performed L hemi w/c propulsion for ~142ft x2 to/from gym with supervision - required min assist for R leg rest donning/doffing. Sit<>stand w/c<>RW with min assist for balance throughout session. Pt wearing R LE posterior leaf spring AFO throughout session. Ambulated 82ft using RW and R hand orthosis with min/mod assist for balance and R LE management with intermittent assist for swing phase foot clearance and tactile cuing throughout for increased quadriceps muscle activation during stance phase. Ascended/descended 4 (6" height steps) using bilateral handrails with cuing for step-to pattern on "up with the good, down with the bad" and mod assist for balance throughout and for posterior lean on descent - assist for R hand grasp management on handrail - guarding R knee but no buckling noted. R LE step up/down on 3" step using L handrail with mod assist for balance and guarding R knee but no buckling - multimodal cuing and manual facilitation for lateral weightshifting for sequencing of stepping. Pt propelled w/c back to room as described above. Stand pivot transfer w/c<>EOB with mod assist for balance. Sit>supine with CGA for LE management. Performed x20 supine bridges with R LE  preference and cuing for proper technique. Supine>sit using bedrails with min assist for trunk upright. Pt left sitting in w/c with needs in reach and chair alarm on.   Therapy Documentation Precautions:  Precautions Precautions: Fall Restrictions Weight Bearing Restrictions: No  Pain:  Denies pain during session.   Therapy/Group: Individual Therapy  Tawana Scale, PT, DPT 01/01/2019, 7:53 AM

## 2019-01-02 ENCOUNTER — Inpatient Hospital Stay (HOSPITAL_COMMUNITY): Payer: Medicare HMO | Admitting: Speech Pathology

## 2019-01-02 ENCOUNTER — Inpatient Hospital Stay (HOSPITAL_COMMUNITY): Payer: Medicare HMO | Admitting: Occupational Therapy

## 2019-01-02 ENCOUNTER — Inpatient Hospital Stay (HOSPITAL_COMMUNITY): Payer: Medicare HMO | Admitting: Physical Therapy

## 2019-01-02 LAB — GLUCOSE, CAPILLARY
Glucose-Capillary: 81 mg/dL (ref 70–99)
Glucose-Capillary: 112 mg/dL — ABNORMAL HIGH (ref 70–99)
Glucose-Capillary: 96 mg/dL (ref 70–99)
Glucose-Capillary: 131 mg/dL — ABNORMAL HIGH (ref 70–99)

## 2019-01-02 NOTE — Progress Notes (Signed)
Physical Therapy Session Note  Patient Details  Name: Robert Brewer MRN: [id number] Date of Birth: 24-Dec-1945  Today's Date: 01/02/2019 PT Individual Time: 5277-8242 PT Individual Time Calculation (min): 44 min   Short Term Goals: Week 2:  PT Short Term Goal 1 (Week 2): Pt will perform bed mobility with no more than min assist WITHOUT bed features PT Short Term Goal 2 (Week 2): Pt will perform bed<>chair transfers with CGA using LRAD PT Short Term Goal 3 (Week 2): Pt will ambulate 73f using LRAD with min assist PT Short Term Goal 4 (Week 2): Pt will ascend/descend 4 steps using handrails with min assist  Skilled Therapeutic Interventions/Progress Updates: Pt presented in w/c agreeable to therapy, pt denies pain throughout session. Pt propelled to day room and performed stand pivot to mat minA. Pt ambulated x 528fwith RW and without R AFO. Pt noted to have R knee instability and difficulty clearing foot due to inability to DF. PTA donned AFO with pt ambulating same distance with improved step length, improved foot clearance, and continued R knee instability. Pt also participated in R NMR via forced use including LAQ AROM 2 x 10, hamstring curls AROM x 10, supine SAQ 2 x 10, and heel slides 2 x 10. Pt was able to lay on mat and return to sitting with CGA and increased time. Pt transferred back to w/c via stand pivot and returned to room. Pt remained in w/c at end of session with seat alarm on, call bell within reach and needs met.      Therapy Documentation Precautions:  Precautions Precautions: Fall Restrictions Weight Bearing Restrictions: No   Therapy/Group: Individual Therapy  Ezekeil Bethel  Elga Santy, PTA  01/02/2019, 12:21 PM

## 2019-01-02 NOTE — Progress Notes (Signed)
Occupational Therapy Weekly Progress Note  Patient Details  Name: Robert Brewer MRN: [id number] Date of Birth: 13-Sep-1945  Beginning of progress report period: December 25, 2018 End of progress report period: January 02, 2019  Today's Date: 01/02/2019 OT Individual Time: 4010-2725 and 1300-1400 OT Individual Time Calculation (min): 60 min and 60 min    Patient has met 4 of 4 short term goals. Pt is making excellent progress towards occupational therapy goals. Pt performing UB self care with min A, LB self care with min - mod A with modifications and use of hemiplegic dressing technique. Pt performing transfers with min A overall with use of RW. Pt is having some return this week in R shoulder and elbow against gravity. Pt with trace movement in wrist and able to perform some digit flexion. Pt is very motivated and continues to benefit from OT intervention.  Patient continues to demonstrate the following deficits: muscle weakness, decreased cardiorespiratoy endurance, decreased coordination and decreased motor planning, decreased safety awareness and decreased standing balance, decreased postural control, hemiplegia and decreased balance strategies and therefore will continue to benefit from skilled OT intervention to enhance overall performance with BADL and iADL.  Patient progressing toward long term goals..  Continue plan of care.   OT Short Term Goals Week 1:  OT Short Term Goal 1 (Week 1): Pt will perform UB dressing with min A to decrease level of assist with self care. OT Short Term Goal 1 - Progress (Week 1): Met OT Short Term Goal 2 (Week 1): Pt will perform toilet transfer with mod A.  OT Short Term Goal 2 - Progress (Week 1): Met OT Short Term Goal 3 (Week 1): Pt will perform toileting with mod A.  OT Short Term Goal 3 - Progress (Week 1): Met OT Short Term Goal 4 (Week 1): Pt will perform LB clothing management with mod A for standing balance.  OT Short Term Goal 4 - Progress (Week 1):  Met Week 2:  OT Short Term Goal 1 (Week 2): STGs=LTGs secondary to upcoming discharge  Skilled Therapeutic Interventions/Progress Updates:    Session 1: Upon entering the room, pt supine in bed and declined bathing and dressing technique and requesting continued interventions to address R UE. Pt demonstrating PNF pattern again in supine with increased ease x 6 reps. Pt very excited and he was able to demonstrate elbow flexion/ext against gravity with min cuing for control x 10 reps. Pt also performing internal <> external rotation in same manner x 10 reps. Pt performing supine >sit with min guard and donning shoes with min A to keep R LE crossed over L LE when donning shoes. Pt standing and ambulating with mod A to sink. Pt standing with R UE in weight bearing at sink while brushing teeth with increased time and min guard for standing balance. Pt remained in wheelchair at end of session with call bell and all needed items within reach. Chair alarm activated.   Session 2: Upon entering the room, pt seated in wheelchair and awaiting OT arrival with no c/o pain this session. Pt propelled wheelchair with hemiplegic technique to gym with supervision. Pt transferred onto matt with min A and use of RW. OT assisted pt into quadruped position and placed therapy ball under chest in order to provide support for exercise. Pt shifting weight L <R x 10 reps with min guard for safety. Pt then lifting L UE and reaching out x 10 to force more weight bearing into R UE.  Pt then extending L LE to increase weight bearing through R LE x 10 reps. Pt needing min A for steadying balance while completing tasks. Pt utilized UE ranger with assist as needed for control and to fully extend in places of movement. Pt returning to wheelchair at end of session and returned to room. Chair alarm activated and all needs within reach.   Therapy Documentation Precautions:  Precautions Precautions: Fall Restrictions Weight Bearing  Restrictions: No   Therapy/Group: Individual Therapy  [name] 01/02/2019, 11:33 AM

## 2019-01-02 NOTE — Progress Notes (Signed)
Robert Brewer   Subjective/Complaints:  Starting to take bowel laxatives CBG low this am refused novalog  Review of systems denies chest pain shortness of breath, nausea vomiting diarrhea, positive for constipation as noted, negative for abdominal pain  Objective:   No results found. No results for input(s): WBC, HGB, HCT, PLT in the last 72 hours. No results for input(s): NA, K, CL, CO2, GLUCOSE, BUN, CREATININE, CALCIUM in the last 72 hours.  Intake/Output Summary (Last 24 hours) at 01/02/2019 1037 Last data filed at 01/02/2019 0711 Gross per 24 hour  Intake 720 ml  Output 1200 ml  Net -480 ml     Physical Exam: Vital Signs Blood pressure (!) 127/59, pulse 60, temperature 98 F (36.7 C), temperature source Oral, resp. rate 16, height 5\' 11"  (1.803 m), weight 95.2 kg, SpO2 96 %.   General: No acute distress Mood and affect are appropriate Heart: Regular rate and rhythm no rubs murmurs or extra sounds Lungs: Clear to auscultation, breathing unlabored, no rales or wheezes Abdomen: Positive bowel sounds, soft nontender to palpation, nondistended Extremities: No clubbing, cyanosis, or edema Skin: No evidence of breakdown, no evidence of rash Neurologic: Cranial nerves II through XII intact, motor strength is 5/5 in LEFT  deltoid, bicep, tricep, grip, hip flexor, knee extensors, ankle dorsiflexor and plantar flexor 2- RIght biceps and finger flexors, 3- Left hip add trace hip/knee ext synergy  Cerebellar exam normal finger to nose to finger as well as heel to shin in left upper and lower extremities Musculoskeletal: Full range of motion in all 4 extremities. No joint swelling   Assessment/Plan: 1. 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  Physiatrist is providing close team supervision and 24 hour  management of active medical problems listed below.  Physiatrist and rehab team continue to assess barriers to discharge/monitor patient progress toward functional and medical goals  Care Tool:  Bathing  Bathing activity did not occur: Refused Body parts bathed by patient: Left arm, Chest, Abdomen, Front perineal area, Right upper leg, Left upper leg, Face, Right arm, Buttocks, Right lower leg, Left lower leg   Body parts bathed by helper: Right arm, Buttocks, Right lower leg, Left lower leg     Bathing assist Assist Level: Minimal Assistance - Patient > 75%     Upper Body Dressing/Undressing Upper body dressing   What is the patient wearing?: Pull over shirt    Upper body assist Assist Level: Supervision/Verbal cueing    Lower Body Dressing/Undressing Lower body dressing      What is the patient wearing?: Pants     Lower body assist Assist for lower body dressing: Minimal Assistance - Patient > 75%     Toileting Toileting Toileting Activity did not occur (Clothing management and hygiene only): Refused  Toileting assist Assist for toileting: Moderate Assistance - Patient 50 - 74%     Transfers Chair/bed transfer  Transfers assist  Chair/bed transfer activity did not occur: Safety/medical concerns  Chair/bed transfer assist level: Moderate Assistance - Patient 50 - 74%     Locomotion Ambulation   Ambulation assist   Ambulation activity did not occur: Safety/medical concerns  Assist level: Moderate Assistance - Patient 50 - 74% Assistive device: Walker-rolling Max distance: 51ft   Walk 10 feet activity   Assist  Walk 10 feet activity did not occur: Safety/medical concerns  Assist level: Moderate Assistance -  Patient - 50 - 74% Assistive device: Walker-rolling   Walk 50 feet activity   Assist Walk 50 feet with 2 turns activity did not occur: Safety/medical concerns  Assist level: Moderate Assistance - Patient - 50 - 74% Assistive device:  Walker-rolling    Walk 150 feet activity   Assist Walk 150 feet activity did not occur: Safety/medical concerns         Walk 10 feet on uneven surface  activity   Assist Walk 10 feet on uneven surfaces activity did not occur: Safety/medical concerns         Wheelchair     Assist Will patient use wheelchair at discharge?: Yes(TBD) Type of Wheelchair: Manual    Wheelchair assist level: Supervision/Verbal cueing, Set up assist Max wheelchair distance: 154ft    Wheelchair 50 feet with 2 turns activity    Assist        Assist Level: Supervision/Verbal cueing, Set up assist   Wheelchair 150 feet activity     Assist     Assist Level: Supervision/Verbal cueing, Set up assist    Medical Problem List and Plan: 1.Right side weakness with facial droop and dysarthriasecondary to 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 Status post TPA. Patient refused loop recorder CIR PT, OT. SLP  2. Antithrombotics: -DVT/anticoagulation:SCDs -antiplatelet therapy: Aspirin 81 mg daily 3. Pain Management:Tylenol as needed 4. Mood:Provide emotional support -antipsychotic agents: N/A 5. Neuropsych: This patientiscapable of making decisions on hisown behalf. 6. Skin/Wound Care:Routine skin checks 7. Fluids/Electrolytes/Nutrition:Routine in and outs with follow-up chemistries -encourage PO 8.Diabetes mellitus. Hemoglobin A1c 13.3. Novolog7units TID,Lantus insulin 30units daily.  CBG (last 3)  Recent Labs    01/01/19 1747 01/01/19 2109 01/02/19 0620  GLUCAP 118* 178* 81    AM CBG below 100 will reduce lantus to 30U as of 6/10  -diabetic education 9. Permissive Hypertension. Monitor with increased mobility. Patient on no prescription medications prior to admission Vitals:   01/01/19 1936 01/02/19 0344  BP: (!) 120/58 (!) 127/59  Pulse: 60 60  Resp: 16 16   Temp: 98.3 F (36.8 C) 98 F (36.7 C)  SpO2: 95% 96%  Controlled 6/10 10.Medical noncompliance. Counseling 11.  Constipation improved, >1BM per day   LOS: 9 days A FACE TO FACE EVALUATION WAS PERFORMED  Robert Brewer 01/02/2019, 10:37 AM

## 2019-01-02 NOTE — Progress Notes (Signed)
Speech Language Pathology Daily Session Note  Patient Details  Name: Robert Brewer MRN: [id number] Date of Birth: [birth year]  Today's Date: 01/02/2019 SLP Individual Time: 1130-1200 SLP Individual Time Calculation (min): 30 min  Short Term Goals: Week 2: SLP Short Term Goal 1 (Week 2): Pt will utilize speech intelligibility strategies at conversation level with supervision A verbal cues to achieve 85% intelligibilty. SLP Short Term Goal 2 (Week 2): Pt will self-monitor and self-correct verbal speech errors with supervision A verbal cues.   Skilled Therapeutic Interventions:  Skilled treatment session focused on communication. SLP facilitated speech intelligibility at the conversation level with Mod I use of speech intelligibility strategies. Pt's speech intelligibility was > 90% when describing political opinions, various family members having surgery and unknown hypothetical conversational topics. SLP also provided education on different situations (such as talking on the phone) that are more difficult for speech intelligibility. Pt left upright in wheelchair with all needs within reach. Continue per current plan of care.      Pain    Therapy/Group: Individual Therapy  Robert Brewer 01/02/2019, 12:53 PM

## 2019-01-02 NOTE — Progress Notes (Signed)
Orthopedic Tech Progress Note Patient Details:  Robert Brewer May 22, 1946 [id number] Called in order to [employer] for a AFO BOOT  Patient ID: Robert Brewer, male   DOB: 05-06-1946, 74 y.o.   MRN: [id number]   [name] 01/02/2019, 10:51 AM

## 2019-01-03 ENCOUNTER — Inpatient Hospital Stay (HOSPITAL_COMMUNITY): Payer: Medicare HMO | Admitting: Physical Therapy

## 2019-01-03 ENCOUNTER — Inpatient Hospital Stay (HOSPITAL_COMMUNITY): Payer: Medicare HMO | Admitting: Occupational Therapy

## 2019-01-03 ENCOUNTER — Inpatient Hospital Stay (HOSPITAL_COMMUNITY): Payer: Medicare HMO | Admitting: Speech Pathology

## 2019-01-03 LAB — GLUCOSE, CAPILLARY
Glucose-Capillary: 86 mg/dL (ref 70–99)
Glucose-Capillary: 87 mg/dL (ref 70–99)
Glucose-Capillary: 142 mg/dL — ABNORMAL HIGH (ref 70–99)
Glucose-Capillary: 173 mg/dL — ABNORMAL HIGH (ref 70–99)

## 2019-01-03 NOTE — Patient Care Conference (Signed)
Inpatient RehabilitationTeam Conference and Plan of Care Update Date: 01/02/2019   Time: 10:50 AM    Patient Name: Robert Brewer      Medical Record Number: [id number]  Date of Birth: [birth year] Sex: Male         Room/Bed: 4W22C/4W22C-01 Payor Info: Payor: [employer] MEDICARE / Plan: [employer] MEDICARE HMO / Product Type: *No Product type* /    Admitting Diagnosis: CVA 1 Team  Lt CVA; 22-24days  Admit Date/Time:  12/24/2018  6:04 PM Admission Comments: No comment available   Primary Diagnosis:  <principal problem not specified> Principal Problem: <principal problem not specified>  Patient Active Problem List   Diagnosis Date Noted  . Reactive depression   . Left pontine cerebrovascular accident ([place]) 12/24/2018  . Cerebral thrombosis with cerebral infarction ([facility]) - L paramedian pontnine and L occipital, s/p  tPA   . S/P admn tPA in diff fac w/n last 24 hr bef adm to crnt fac   . Right hemiparesis ([place])   . Dysarthria   . Dysphagia, pharyngoesophageal phase   . Essential hypertension   . Patient non-compliant, refused service   . Non compliance w medication regimen   . Hyperglycemia   . Uncontrolled type 2 diabetes mellitus with hyperglycemia ([place])   . Hyperlipidemia LDL goal <70   . Ischemic cerebrovascular accident (CVA) ([place]) 12/17/2018    Expected Discharge Date: Expected Discharge Date: 01/14/19  Team Members Present: Physician leading conference: Dr. Alysia Penna Social Worker Present: Alfonse Alpers, LCSW Nurse Present: Isla Pence, RN PT Present: Barrie Folk, PT;Rosita Dechalus, PTA OT Present: Darleen Crocker, OT SLP Present: Stormy Fabian, SLP PPS Coordinator present : Ileana Ladd, PT     Current Status/Progress Goal Weekly Team Focus  Medical   dysarthria, mild, RUE some motor return noted , BP controlled   maintain med stability  RUE NM re education    Bowel/Bladder   continent of bowel & bladder, LBM 6/7, refuses stool medications & insulin, he did take  senna last night  remain continent, regular bowel pattern  monitor & assist as needed   Swallow/Nutrition/ Hydration             ADL's   Mod A for stand pivot bathroom transfers, supervision for UB self care using hemi techniques, min A for LB self care, mod A toileting   supervision overall  Rt NMR, self care retraining, balance, endurance, functional transfers    Mobility   minA bed mobility, minA transfers, R knee instability noted during gait, gait minA with R AFO up to 62ft  supervision overall  R NMR, balance, gait   Communication   Min A for conversation  Mod I  carry over of speech strategies at conversation level   Safety/Cognition/ Behavioral Observations            Pain   no c/o pain on shift, has tylenol prn  pain scale <2/10  assess & treat as needed   Skin   no signs of skin breakdown at this time  no acquired skin break down  assess q shift    Rehab Goals Patient on target to meet rehab goals: Yes Rehab Goals Revised: none *See Care Plan and progress notes for long and short-term goals.     Barriers to Discharge  Current Status/Progress Possible Resolutions Date Resolved   Physician    Medical stability     progressing toward goals  cont rehab      Nursing  PT                    OT                  SLP                SW                Discharge Planning/Teaching Needs:  Pt plans to return to his home with his wife to provide care for him as needed.  Wife may not come for family education, so CSW has requested that therapists call wife to talk through things over the phone.   Team Discussion:  Pt is doing well medically, watching blood sugars.  Pt is continent and agreed to take medications for bowels.  Pt is min A for functional txs with the rolling walker.  Pt is min A for LB self care tasks.  Needs mod A for toileting currently.  He is getting some movement in his arms and some grasp.  Pt is doing well with PT and is min A for bed  mobility, min/CGA for txs and gait of 50'.  He has decreased knee extension and AFOs are being tried.  Pt is S level for speech at conversation level, with mod I goals.  Revisions to Treatment Plan:  none    Continued Need for Acute Rehabilitation Level of Care: The patient requires daily medical management by a physician with specialized training in physical medicine and rehabilitation for the following conditions: Daily direction of a multidisciplinary physical rehabilitation program to ensure safe treatment while eliciting the highest outcome that is of practical value to the patient.: Yes Daily medical management of patient stability for increased activity during participation in an intensive rehabilitation regime.: Yes Daily analysis of laboratory values and/or radiology reports with any subsequent need for medication adjustment of medical intervention for : Neurological problems   I attest that I was present, lead the team conference, and concur with the assessment and plan of the team.Team conference was held via web/ teleconference due to [place] - 19.   Christon Gallaway, Silvestre Mesi 01/03/2019, 11:43 AM

## 2019-01-03 NOTE — Progress Notes (Signed)
Speech Language Pathology Discharge Summary  Patient Details  Name: Robert Brewer MRN: [id number] Date of Birth: 06-03-1946  Today's Date: 01/03/2019 SLP Individual Time: 1000-1030 SLP Individual Time Calculation (min): 30 min   Skilled Therapeutic Interventions:  Skilled treatment session focused on communication and education. Pt able to implement speech intelligibility strategies with Mod I during complex conversation to achieve more normalized rate of speech to achieve > 95% intelligibility. Education completed on use of strategies, progress as well as prognosis for continued recovery of normalized speech. Decision made to discharge pt from [facility] services to target on physical needs with PT/OT. Nursing and interdisciplinary team agree with decision.     Patient has met 3 of 3 long term goals.  Patient to discharge at overall Modified Independent level.     Clinical Impression/Discharge Summary:   Pt has made great progress over the course of ST sessions. As a result, he is able to utilize speech intelligibility strategies and no longer benefits from skilled ST.  Recommendation:  None      Equipment:   N/A  Reasons for discharge: Treatment goals met   Patient/Family Agrees with Progress Made and Goals Achieved: Yes    [name] 01/03/2019, 2:51 PM

## 2019-01-03 NOTE — Progress Notes (Signed)
Wife Rose called back. Informed of fall, informed pt okay, no c/o pain and no injuries. She informed me that he doesn't like to ask for help and was afraid he would try and do things by himself.

## 2019-01-03 NOTE — Progress Notes (Signed)
Occupational Therapy Session Note  Patient Details  Name: Robert Brewer MRN: [id number] Date of Birth: [birth year]  Today's Date: 01/03/2019 OT Individual Time: 0700-0757 OT Individual Time Calculation (min): 57 min    Short Term Goals: Week 2:  OT Short Term Goal 1 (Week 2): STGs=LTGs secondary to upcoming discharge  Skilled Therapeutic Interventions/Progress Updates:    Upon entering the room, pt supine in bed with no c/o pain this session. Pt performed supine >sit with min guard. Pt transferred into wheelchair with mod A stand pivot transfer. OT assisted pt into bathroom via wheelchair. Pt transferring onto shower seat with min A and use of grab bar. OT placing bath mit on R hand this session to increase use for self care task. Pt utilized hand over hand to utilize R UE further as well with min cuing. Pt returning to wheelchair and dressing self with focus on sit <>stand from wheelchair level at sink. Pt required min cuing for hemiplegic dressing techniques. Pt standing with min A at sink with use of mirror for visual feed back with assistance to fasten shorts. Pt returning to wheelchair and donning B socks with one handed technique in figure four position as well as shoes with use of shoe button. Pt remained in wheelchair with chair alarm activated and call bell within reach.   Therapy Documentation Precautions:  Precautions Precautions: Fall Restrictions Weight Bearing Restrictions: No General:   Vital Signs: Therapy Vitals Temp: 98.3 F (36.8 C) Temp Source: Oral Pulse Rate: 60 Resp: 16 BP: 126/63 Patient Position (if appropriate): Lying Oxygen Therapy SpO2: 95 % O2 Device: Room Air   Therapy/Group: Individual Therapy  [name] 01/03/2019, 8:02 AM

## 2019-01-03 NOTE — Progress Notes (Signed)
Physical Therapy Session Note  Patient Details  Name: Robert Brewer MRN: [id number] Date of Birth: 11-11-1945  Today's Date: 01/03/2019 PT Individual Time: 3276-1470 PT Individual Time Calculation (min): 25 min   Short Term Goals: Week 2:  PT Short Term Goal 1 (Week 2): Pt will perform bed mobility with no more than min assist WITHOUT bed features PT Short Term Goal 2 (Week 2): Pt will perform bed<>chair transfers with CGA using LRAD PT Short Term Goal 3 (Week 2): Pt will ambulate 48f using LRAD with min assist PT Short Term Goal 4 (Week 2): Pt will ascend/descend 4 steps using handrails with min assist  Skilled Therapeutic Interventions/Progress Updates:   Pt received sitting in WC and agreeable to PT. WC mobility completed with supervision assist with min cues for safety through doors. PT donned R AFO while pt sitting in WC. Gait training with RW, RAFO and R hand orthotic x 81fwith GCA assist and min cues for step length and height on the R to prevent foot drag. Seated NMR BLE LAQ, reciprocal hip flexion, and hip abduction with level 2 tband. Min cues for decreased speed with eccentric movements. [facility] returned to [facility] and [facility] and all needs met.           Therapy Documentation Precautions:  Precautions Precautions: Fall Restrictions Weight Bearing Restrictions: No    Vital Signs: Therapy Vitals Temp: 98.1 F (36.7 C) Pulse Rate: (!) 58 Resp: 16 BP: 120/73 Patient Position (if appropriate): Sitting Oxygen Therapy SpO2: 98 % O2 Device: Room Air Pain: denies   Therapy/Group: Individual Therapy  AuLorie Phenix/05/2019, 5:42 PM

## 2019-01-03 NOTE — Progress Notes (Signed)
Social Work Patient ID: Lyanne Co, male   DOB: 08-04-1945, 73 y.o.   MRN: [id number]   CSW met with pt and wife (via telephone) to update them on team conference discussion and to confirm targeted d/c date of 01-14-19.  Pt's goal is "to walk out of here with a cane."  Pt's wife sounded very down,overwhelmed, and concerned over the phone.  Pt reported later that she is overwhelmed by the care she has to provide to all the cats they have.  Pt will not be able to help much at first after d/c, either.  Pt stated wife will likely not come for family education and CSW has asked therapists to reach out to her via telephone to discuss her concerns, set up of home, and what supervision needs pt will have.  Pt, overall, seems better emotionally and he agrees.  CSW will continue to follow and assist as needed.

## 2019-01-03 NOTE — Progress Notes (Signed)
[facility] PHYSICAL MEDICINE & REHABILITATION PROGRESS NOTE   Subjective/Complaints:  Found sitting on floor   Review of systems denies chest pain shortness of breath, nausea vomiting diarrhea, positive for constipation as noted, negative for abdominal pain  Objective:   No results found. No results for input(s): WBC, HGB, HCT, PLT in the last 72 hours. No results for input(s): NA, K, CL, CO2, GLUCOSE, BUN, CREATININE, CALCIUM in the last 72 hours.  Intake/Output Summary (Last 24 hours) at 01/03/2019 1609 Last data filed at 01/03/2019 1300 Gross per 24 hour  Intake 600 ml  Output 1250 ml  Net -650 ml     Physical Exam: Vital Signs Blood pressure 123/85, pulse 77, temperature 98.3 F (36.8 C), resp. rate 18, height 5\' 11"  (1.803 m), weight 95.3 kg, SpO2 97 %.   General: No acute distress Mood and affect are appropriate Heart: Regular rate and rhythm no rubs murmurs or extra sounds Lungs: Clear to auscultation, breathing unlabored, no rales or wheezes Abdomen: Positive bowel sounds, soft nontender to palpation, nondistended Extremities: No clubbing, cyanosis, or edema Skin: No evidence of breakdown, no evidence of rash Neurologic: Cranial nerves II through XII intact, motor strength is 5/5 in LEFT  deltoid, bicep, tricep, grip, hip flexor, knee extensors, ankle dorsiflexor and plantar flexor 2- RIght biceps and finger flexors, 3- Left hip add trace hip/knee ext synergy  Cerebellar exam normal finger to nose to finger as well as heel to shin in left upper and lower extremities Musculoskeletal: Full range of motion in all 4 extremities. No joint swelling   Assessment/Plan: 1. 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  Physiatrist is providing close team supervision and 24 hour management of active medical problems listed below.  Physiatrist and  rehab team continue to assess barriers to discharge/monitor patient progress toward functional and medical goals  Care Tool:  Bathing  Bathing activity did not occur: Refused Body parts bathed by patient: Right arm, Chest, Abdomen, Front perineal area, Buttocks, Right upper leg, Left upper leg, Right lower leg, Left lower leg, Face   Body parts bathed by helper: Left arm     Bathing assist Assist Level: Minimal Assistance - Patient > 75%     Upper Body Dressing/Undressing Upper body dressing   What is the patient wearing?: Pull over shirt    Upper body assist Assist Level: Supervision/Verbal cueing    Lower Body Dressing/Undressing Lower body dressing      What is the patient wearing?: Pants     Lower body assist Assist for lower body dressing: Minimal Assistance - Patient > 75%     Toileting Toileting Toileting Activity did not occur (Clothing management and hygiene only): Refused  Toileting assist Assist for toileting: Moderate Assistance - Patient 50 - 74%     Transfers Chair/bed transfer  Transfers assist  Chair/bed transfer activity did not occur: Safety/medical concerns  Chair/bed transfer assist level: Contact Guard/Touching assist     Locomotion Ambulation   Ambulation assist   Ambulation activity did not occur: Safety/medical concerns  Assist level: Minimal Assistance - Patient > 75% Assistive device: Walker-rolling Max distance: 50'   Walk 10 feet activity   Assist  Walk 10 feet activity did not occur: Safety/medical concerns  Assist level: Minimal Assistance - Patient > 75% Assistive device: Walker-rolling   Walk 50 feet activity   Assist Walk 50 feet with 2 turns activity did  not occur: Safety/medical concerns  Assist level: Minimal Assistance - Patient > 75% Assistive device: Walker-rolling    Walk 150 feet activity   Assist Walk 150 feet activity did not occur: Safety/medical concerns         Walk 10 feet on uneven  surface  activity   Assist Walk 10 feet on uneven surfaces activity did not occur: Safety/medical concerns         Wheelchair     Assist Will patient use wheelchair at discharge?: Yes(TBD) Type of Wheelchair: Manual    Wheelchair assist level: Supervision/Verbal cueing Max wheelchair distance: 150'    Wheelchair 50 feet with 2 turns activity    Assist        Assist Level: Supervision/Verbal cueing   Wheelchair 150 feet activity     Assist     Assist Level: Supervision/Verbal cueing    Medical Problem List and Plan: 1.Right side weakness with facial droop and dysarthriasecondary to 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 Status post TPA. Patient refused loop recorder CIR PT, OT. SLP  2. Antithrombotics: -DVT/anticoagulation:SCDs -antiplatelet therapy: Aspirin 81 mg daily 3. Pain Management:Tylenol as needed 4. Mood:Provide emotional support -antipsychotic agents: N/A 5. Neuropsych: This patientiscapable of making decisions on hisown behalf. 6. Skin/Wound Care:Routine skin checks 7. Fluids/Electrolytes/Nutrition:Routine in and outs with follow-up chemistries -encourage PO 8.Diabetes mellitus. Hemoglobin A1c 13.3. Novolog7units TID,Lantus insulin 30units daily.  CBG (last 3)  Recent Labs    01/02/19 2113 01/03/19 0626 01/03/19 1223  GLUCAP 131* 86 87    AM CBG below 100 will reduce lantus to 30U as of 6/10  -diabetic education 9. Permissive Hypertension. Monitor with increased mobility. Patient on no prescription medications prior to admission Vitals:   01/03/19 1334 01/03/19 1503  BP: 114/74 123/85  Pulse: 62 77  Resp: 16 18  Temp: 98.5 F (36.9 C) 98.3 F (36.8 C)  SpO2: 94% 97%  Controlled 6/11 10.Medical noncompliance. Counseling 11.  Constipation improved, >1BM per day   LOS: 10 days A FACE TO FACE EVALUATION WAS  PERFORMED  [name] 01/03/2019, 4:09 PM

## 2019-01-03 NOTE — Progress Notes (Signed)
Physical Therapy Session Note  Patient Details  Name: Robert Brewer MRN: [id number] Date of Birth: [birth year]  Today's Date: 01/03/2019 PT Individual Time: 1115-1224 PT Individual Time Calculation (min): 69 min   Short Term Goals: Week 2:  PT Short Term Goal 1 (Week 2): Pt will perform bed mobility with no more than min assist WITHOUT bed features PT Short Term Goal 2 (Week 2): Pt will perform bed<>chair transfers with CGA using LRAD PT Short Term Goal 3 (Week 2): Pt will ambulate 30ft using LRAD with min assist PT Short Term Goal 4 (Week 2): Pt will ascend/descend 4 steps using handrails with min assist  Skilled Therapeutic Interventions/Progress Updates:   Pt in w/c and agreeable to therapy, no c/o pain. Pt self-propelled w/c to/from therapy gym w/ supervision via L hemi technique. Worked on gait training w/ and w/o RAFO, ortho rep present to assess for AFO use. Ambulated in multiple 25'-50' bouts w/ CGA-min assist using RW, various types of RAFO. Pt w/ most normal gait pattern using soft ground-reaction AFO. Discussed benefits of AFO use for independence w/ gait as pt hesitant to use it once he leaves here. Pt more agreeable towards end of session and understands how it relates to increased independence. Supine RLE NMR remainder of session including single leg bridge 3x10, heel slides 2x10, hip IR/ER 2x10, and PNF D1/D2 2x10. Seated heel raises 3x10. Kinetron RLE only w/ mild resistance from therapist to work on R glut/hamstring activation, 3x10 reps. Verbal and tactile cues w/ all NMR exercises for isolated muscle contraction and to not valsalva w/ all exercises. Returned to room via w/c, ended session in w/c and all needs in reach.   Therapy Documentation Precautions:  Precautions Precautions: Fall Restrictions Weight Bearing Restrictions: No  Therapy/Group: Individual Therapy  [name] 01/03/2019, 12:39 PM

## 2019-01-03 NOTE — Progress Notes (Addendum)
Chair alarm going off, entered room and pt was found unassisted sitting on floor facing wall between bed and side of wall. Pt had shoes on and call bell in reach. Pt reported that needed to readjust his pants and thought he could stand and do it. Pt reports no pain at this time, did not hit head, no injuries noted, pt was able to get back into chair with one assist, chair alarm armed and functional, call bell in reach. VS charted. Marlowe Shores PA notified at 1515 of fall and left mesg for on home number for wife [name] to call RN at [phone].

## 2019-01-04 ENCOUNTER — Inpatient Hospital Stay (HOSPITAL_COMMUNITY): Payer: Medicare HMO | Admitting: Physical Therapy

## 2019-01-04 ENCOUNTER — Inpatient Hospital Stay (HOSPITAL_COMMUNITY): Payer: Medicare HMO | Admitting: Occupational Therapy

## 2019-01-04 LAB — GLUCOSE, CAPILLARY
Glucose-Capillary: 92 mg/dL (ref 70–99)
Glucose-Capillary: 95 mg/dL (ref 70–99)
Glucose-Capillary: 127 mg/dL — ABNORMAL HIGH (ref 70–99)
Glucose-Capillary: 134 mg/dL — ABNORMAL HIGH (ref 70–99)

## 2019-01-04 NOTE — Progress Notes (Signed)
Occupational Therapy Session Note  Patient Details  Name: Robert Brewer MRN: [id number] Date of Birth: [birth year]  Today's Date: 01/04/2019 OT Individual Time: 5284-1324 OT Individual Time Calculation (min): 74 min   Skilled Therapeutic Interventions/Progress Updates:    Pt greeted in w/c with no c/o pain. Wearing Rt AFO. ADL needs met and agreeable to tx. Started with ambulatory transfer to toilet using device with Min A. Able to negotiate threshold without issue. He completed 2 of these simulated transfers to work on functional ambulation (20 ft in total). Discussed that at this time he can only practice ambulatory bathroom transfers with therapy. He verbalized understanding. After handwashing, pt self propelled w/c to dayroom using hemi techniques. Worked on [employer] using small arm bike. His hand was strapped to pedal using ACE wrap. Pt then completed forward/backward rotations for 3 minutes each (6 min in total). He reported Rt shoulder soreness after, which absolved with seated rest. Next had pt engage in dancing while sitting and standing. Incorporated trunk involvement during active assist shoulder shrug and arm cradle. While standing, pt was weighbearing with Rt hand on elevated table. Pt shook hips, completed mini squats, as well as forward/backward rocks. Education emphasis placed on equal LE weightbearing and Rt arm activation during rocking/squatting. He then self propelled ~615 fit in unit to work on activity tolerance. In room, pt ambulated with RW and Min A to his recliner. Able to manage Rt hand orthosis. Throughout session, we frequently practiced completing stand<sit transitions without UE support to work on trunk control. With this setup he needs Mod A for control while lowering, and Max A for power ups. Taught him a few trunk strengthening exercises that he can complete in the w/c. At end of session pt was left in recliner with all needs within reach and safety belt fastened.     Therapy Documentation Precautions:  Precautions Precautions: Fall Restrictions Weight Bearing Restrictions: No Vital Signs: Therapy Vitals Temp: 98.4 F (36.9 C) Temp Source: Oral Pulse Rate: (!) 55 Resp: 16 BP: 101/66 Patient Position (if appropriate): Sitting Oxygen Therapy SpO2: 97 % O2 Device: Room Air ADL:       Therapy/Group: Individual Therapy  Mecca Barga A Giovany Cosby 01/04/2019, 4:10 PM

## 2019-01-04 NOTE — Progress Notes (Signed)
Physical Therapy Session Note  Patient Details  Name: Robert Brewer MRN: [id number] Date of Birth: 07/24/1946  Today's Date: 01/04/2019 PT Individual Time: 7829-5621 AND 3086-5784 PT Individual Time Calculation (min): 70 min and 42 min   Short Term Goals: Week 1:  PT Short Term Goal 1 (Week 1): Pt will complete all bed mobility with mod assist without hospital bed features. PT Short Term Goal 1 - Progress (Week 1): Met PT Short Term Goal 2 (Week 1): Pt will ambulate 50 ft with LRAD & mod assist. PT Short Term Goal 2 - Progress (Week 1): Met PT Short Term Goal 3 (Week 1): Pt will negotiate 8 steps with B rails & mod assist. PT Short Term Goal 3 - Progress (Week 1): Progressing toward goal PT Short Term Goal 4 (Week 1): Pt will consistently complete bed<>w/c transfers with min assist. PT Short Term Goal 4 - Progress (Week 1): Met Week 2:  PT Short Term Goal 1 (Week 2): Pt will perform bed mobility with no more than min assist WITHOUT bed features PT Short Term Goal 2 (Week 2): Pt will perform bed<>chair transfers with CGA using LRAD PT Short Term Goal 3 (Week 2): Pt will ambulate 60f using LRAD with min assist PT Short Term Goal 4 (Week 2): Pt will ascend/descend 4 steps using handrails with min assist  Skilled Therapeutic Interventions/Progress Updates:  session 1   Pt received sitting in WC and agreeable to PT. WC mobility 2 x 2069fwith hemi technique and supervision assist from PT, only min cues required for obstacle navigation in room.    Gait training with RW and AFO x 15070fmin-CGA from PT as well as cues for step height and improved hip flexion on the RLE. Dynamic gait training to weave through 6 cones x 2. Step overs 4 1 inch thresholds x 2, forward.back ward [id number] in parallel bars x [id number]. Side stepping R and L 4 x 49ft 67f Min assist throughout variable gait training to improve safety and sequencing of movements to improve activatin of right glutes. Reciprocal foot  taps on 3 inch step with BUE support on rails. Min cues for posture, step height and improved foot clearance when returning to standing position. Throughout treatment, pr required multiple prolonged therapeutic rest breaks due to RLE fatigue. Patient returned to room and left sitting in WC wit[facility] bell in reach and all needs met.      session 2.  Pt received sitting in WC and agreeable to PT. PT performed WC mobility 2 x 120ft w5fsupervision assist from PT for safety and improved positioning in WC to prevent sliding out of WC>   PT instructed pt in sit<>stand transfers from WC x 4 [facility] with CGA-supervision assist from PT for safety and min cues for set up and LE placement.   Standing NMR/balance training to force WB through the RLE as well weight shifting R and L on Wii fit balance board penguin slide x 2 and table tilt x 1. Min assist and moderate cues for use of hip strategy to improve successful R weight shift and symmetry of WB in BLE.   Orthotist delivered pt RAFO. Gait training with new AFO, RW and R hand orthotic x 70ft [id number] ass47m from for safety and improved weight shift to advance the RLE. Patient returned to room and left sitting in WC with [facility] bell in reach and all needs met.       Therapy  Documentation Precautions:  Precautions Precautions: Fall Restrictions Weight Bearing Restrictions: No Vital Signs:   Pain: Pain Assessment Pain Scale: 0-10 Pain Score: 0-No pain denies    Therapy/Group: Individual Therapy  Lorie Phenix 01/04/2019, 10:31 AM

## 2019-01-04 NOTE — Progress Notes (Signed)
[place] PHYSICAL MEDICINE & REHABILITATION PROGRESS NOTE   Subjective/Complaints:  No issues overnite, discussed fall which was a slide out of WC, no injury but "pride was bruised" Had BM  Review of systems denies chest pain shortness of breath, nausea vomiting diarrhea,  Objective:   No results found. No results for input(s): WBC, HGB, HCT, PLT in the last 72 hours. No results for input(s): NA, K, CL, CO2, GLUCOSE, BUN, CREATININE, CALCIUM in the last 72 hours.  Intake/Output Summary (Last 24 hours) at 01/04/2019 0905 Last data filed at 01/04/2019 0534 Gross per 24 hour  Intake 240 ml  Output 800 ml  Net -560 ml     Physical Exam: Vital Signs Blood pressure 111/67, pulse 70, temperature 97.8 F (36.6 C), temperature source Oral, resp. rate 18, height 5\' 11"  (1.803 m), weight 94.6 kg, SpO2 99 %.   General: No acute distress Mood and affect are appropriate Heart: Regular rate and rhythm no rubs murmurs or extra sounds Lungs: Clear to auscultation, breathing unlabored, no rales or wheezes Abdomen: Positive bowel sounds, soft nontender to palpation, nondistended Extremities: No clubbing, cyanosis, or edema Skin: No evidence of breakdown, no evidence of rash Neurologic: Cranial nerves II through XII intact, motor strength is 5/5 in LEFT  deltoid, bicep, tricep, grip, hip flexor, knee extensors, ankle dorsiflexor and plantar flexor 2- RIght biceps and finger flexors, 3- Left hip flexion and 3/5 hip /knee ext syndergy, 0 at ankle  Cerebellar exam normal finger to nose to finger as well as heel to shin in left upper and lower extremities Musculoskeletal: Full range of motion in all 4 extremities. No joint swelling   Assessment/Plan: 1. 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  Physiatrist is providing close team supervision and 24 hour management of  active medical problems listed below.  Physiatrist and rehab team continue to assess barriers to discharge/monitor patient progress toward functional and medical goals  Care Tool:  Bathing  Bathing activity did not occur: Refused Body parts bathed by patient: Right arm, Chest, Abdomen, Front perineal area, Buttocks, Right upper leg, Left upper leg, Right lower leg, Left lower leg, Face   Body parts bathed by helper: Left arm     Bathing assist Assist Level: Minimal Assistance - Patient > 75%     Upper Body Dressing/Undressing Upper body dressing   What is the patient wearing?: Pull over shirt    Upper body assist Assist Level: Supervision/Verbal cueing    Lower Body Dressing/Undressing Lower body dressing      What is the patient wearing?: Pants     Lower body assist Assist for lower body dressing: Minimal Assistance - Patient > 75%     Toileting Toileting Toileting Activity did not occur (Clothing management and hygiene only): Refused  Toileting assist Assist for toileting: Moderate Assistance - Patient 50 - 74%     Transfers Chair/bed transfer  Transfers assist  Chair/bed transfer activity did not occur: Safety/medical concerns  Chair/bed transfer assist level: Contact Guard/Touching assist     Locomotion Ambulation   Ambulation assist   Ambulation activity did not occur: Safety/medical concerns  Assist level: Contact Guard/Touching assist Assistive device: Walker-rolling Max distance: 80   Walk 10 feet activity   Assist  Walk 10 feet activity did not occur: Safety/medical concerns  Assist level: Contact Guard/Touching assist Assistive device: Walker-rolling   Walk 50 feet activity   Assist  Walk 50 feet with 2 turns activity did not occur: Safety/medical concerns  Assist level: Contact Guard/Touching assist Assistive device: Walker-rolling    Walk 150 feet activity   Assist Walk 150 feet activity did not occur: Safety/medical  concerns         Walk 10 feet on uneven surface  activity   Assist Walk 10 feet on uneven surfaces activity did not occur: Safety/medical concerns         Wheelchair     Assist Will patient use wheelchair at discharge?: Yes Type of Wheelchair: Manual    Wheelchair assist level: Supervision/Verbal cueing Max wheelchair distance: 200    Wheelchair 50 feet with 2 turns activity    Assist        Assist Level: Supervision/Verbal cueing   Wheelchair 150 feet activity     Assist     Assist Level: Supervision/Verbal cueing    Medical Problem List and Plan: 1.Right side weakness with facial droop and dysarthriasecondary to 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 Status post TPA. Patient refused loop recorder CIR PT, OT. SLP  2. Antithrombotics: -DVT/anticoagulation:SCDs -antiplatelet therapy: Aspirin 81 mg daily 3. Pain Management:Tylenol as needed 4. Mood:Provide emotional support -antipsychotic agents: N/A 5. Neuropsych: This patientiscapable of making decisions on hisown behalf. 6. Skin/Wound Care:Routine skin checks 7. Fluids/Electrolytes/Nutrition:Routine in and outs with follow-up chemistries -encourage PO 8.Diabetes mellitus. Hemoglobin A1c 13.3. Novolog7units TID,Lantus insulin 30units daily.  CBG (last 3)  Recent Labs    01/03/19 1739 01/03/19 2058 01/04/19 0634  GLUCAP 142* 173* 92  Overall in good range,  -diabetic education 9. Permissive Hypertension. Monitor with increased mobility. Patient on no prescription medications prior to admission Vitals:   01/03/19 2002 01/04/19 0533  BP: 118/61 111/67  Pulse: (!) 55 70  Resp: 18 18  Temp: 98.2 F (36.8 C) 97.8 F (36.6 C)  SpO2: 95% 99%  Controlled 6/12 10.Medical noncompliance. Counseling 11.  Constipation improved, >1BM per day   LOS: 11 days A FACE TO FACE  EVALUATION WAS PERFORMED  [name] 01/04/2019, 9:05 AM

## 2019-01-05 LAB — GLUCOSE, CAPILLARY
Glucose-Capillary: 95 mg/dL (ref 70–99)
Glucose-Capillary: 116 mg/dL — ABNORMAL HIGH (ref 70–99)
Glucose-Capillary: 234 mg/dL — ABNORMAL HIGH (ref 70–99)
Glucose-Capillary: 191 mg/dL — ABNORMAL HIGH (ref 70–99)

## 2019-01-05 NOTE — Progress Notes (Signed)
Physical Therapy Session Note  Patient Details  Name: Robert Brewer MRN: [id number] Date of Birth: 25-Apr-1946  Today's Date: 01/05/2019 PT Individual Time: 4562-5638 PT Individual Time Calculation (min): 40 min   Short Term Goals: Week 2:  PT Short Term Goal 1 (Week 2): Pt will perform bed mobility with no more than min assist WITHOUT bed features PT Short Term Goal 2 (Week 2): Pt will perform bed<>chair transfers with CGA using LRAD PT Short Term Goal 3 (Week 2): Pt will ambulate 78f using LRAD with min assist PT Short Term Goal 4 (Week 2): Pt will ascend/descend 4 steps using handrails with min assist  Skilled Therapeutic Interventions/Progress Updates:   Pt received sitting in WC and agreeable to PT. WC mobility with hemi technique and distant supervision assist for safety to improved awareness of obstacles on the L side as well as improved positioning in WC. Dynamic standing balance while engaged in Wii bowling x 25 minutes with 1 UE support on RW. Cues from PT for improved WB through RLE and RUE as well as increased trunk activation to correct mild R LOB. Gait training with RW and RAFO x 543fwith min assist from PT. Moderate cues for safety in turns as well as AD management to prepare for transfers. Sit<>stand with supervision assist x 3 throughout treatment with cues for set up and anterior weight shift. Pt noted to have mild subluxation on the RUE, PT provided lap tray for WCEn[facility] Hospitalor pain management and improved UE positioning while sitting in WC. Patient returned to room and [facility] in reach and all needs met.         Therapy Documentation Precautions:  Precautions Precautions: Fall Restrictions Weight Bearing Restrictions: No Pain: denies at rest.   Therapy/Group: Individual Therapy  AuLorie Phenix/13/2020, 5:51 PM

## 2019-01-05 NOTE — Progress Notes (Signed)
[place] PHYSICAL MEDICINE & REHABILITATION PROGRESS NOTE   Subjective/Complaints:  N patient feels well.  He continues with right upper extremity weakness.  He feels like he is regaining some strength with therapy. Objective:  Physical Exam: Vital Signs Blood pressure 109/63, pulse (!) 42, temperature 97.9 F (36.6 C), temperature source Oral, resp. rate 16, height 5\' 11"  (1.803 m), weight 95 kg, SpO2 95 %.  Pleasant, elderly male in no acute distress.  HEENT exam atraumatic, normocephalic, extraocular muscles are intact.  Neck is supple without lymphadenopathy or thyromegaly.  Chest clear to auscultation without any increased work of breathing.  Cardiac exam S1 and S2 are regular without gallops.  Abdominal exam active bowel sounds, soft, nontender.  Extremities without any edema.  Neurologic exam and he is alert.  There is a dense right upper extremity hemiparesis.Marland Kitchen  He does have the ability to shrug.  Assessment/Plan: 1. 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 Status post TPA. Patient refused loop recorder CIR PT, OT. SLP  2. Antithrombotics: -DVT/anticoagulation:SCDs -antiplatelet therapy: Aspirin 81 mg daily 3. Pain Management:Tylenol as needed 4. Mood:Provide emotional support -antipsychotic agents: N/A 5. Neuropsych: This patientiscapable of making decisions on hisown behalf. 6. Skin/Wound Care:Routine skin checks 7. Fluids/Electrolytes/Nutrition:Routine in and outs with follow-up chemistries -encourage PO 8.Diabetes mellitus. Hemoglobin A1c 13.3. Novolog7units TID,Lantus insulin 30units daily.  CBG (last 3)  Recent Labs    01/04/19 1657 01/04/19 2158 01/05/19 0630  GLUCAP 127* 134* 95  Fair control. 9.  Permissive Hypertension. Currently on no medications. Vitals:   01/04/19 2025 01/05/19 0501  BP: 122/75 109/63  Pulse: (!) 58 (!) 42  Resp: 16 16  Temp: 98.3 F (36.8 C) 97.9 F (36.6 C)  SpO2: 96% 95%   10.Medical noncompliance. Counseling 11.  Constipation improved, >1BM per day   LOS: 12 days A FACE TO FACE EVALUATION WAS PERFORMED  Bruce H Swords 01/05/2019, 9:17 AM

## 2019-01-06 ENCOUNTER — Inpatient Hospital Stay (HOSPITAL_COMMUNITY): Payer: Medicare HMO | Admitting: Occupational Therapy

## 2019-01-06 ENCOUNTER — Inpatient Hospital Stay (HOSPITAL_COMMUNITY): Payer: Medicare HMO | Admitting: Physical Therapy

## 2019-01-06 LAB — GLUCOSE, CAPILLARY
Glucose-Capillary: 123 mg/dL — ABNORMAL HIGH (ref 70–99)
Glucose-Capillary: 89 mg/dL (ref 70–99)
Glucose-Capillary: 139 mg/dL — ABNORMAL HIGH (ref 70–99)
Glucose-Capillary: 182 mg/dL — ABNORMAL HIGH (ref 70–99)

## 2019-01-06 NOTE — Progress Notes (Signed)
Occupational Therapy Session Note  Patient Details  Name: Robert Brewer MRN: [id number] Date of Birth: 11/02/1945  Today's Date: 01/06/2019 OT Individual Time: 1452-1550 OT Individual Time Calculation (min): 58 min   Skilled Therapeutic Interventions/Progress Updates:    Pt greeted in w/c with no c/o pain. Requesting to shower. He began doffing clothing w/c level with vcs to lock brakes. Pt then transferred to shower chair with Mod A via stand pivot. Min A for balance while he lowered pants. With increased time, pt donned the wash mitt himself. Vcs required for integrating Rt UE to wash upper thighs. He utilized one handed techniques to wash Lt side. Mod A for standing balance with 1 LOB during perihygiene. He needed Mod A to correct. During dressing, Min-Mod A for dynamic standing balance when fastening pants. Able to bend forward without LOB and utilize seated figure 4 as needed. He donned AFO with supervision. Throughout session, OT facilitated R UE weightbearing on grab bar or sink. He was a bit resistant to [facility] and preferred to use one handed techniques during grooming tasks. Pt with active elbow flexion against gravity now (given that his Rt hand is supinated prior to attempt). He relies heavily on pulling up on surfaces, though cued not to. Discussed safety implications of this at home, emphasizing his grab bar reliance when he will not have any post d/c. He would benefit from additional sit<stand practice. At end of session pt was left in w/c with all needs within reach and chair alarm set.    Therapy Documentation Precautions:  Precautions Precautions: Fall Restrictions Weight Bearing Restrictions: No Vital Signs: Therapy Vitals Temp: 98 F (36.7 C) Temp Source: Oral Pulse Rate: 65 Resp: 18 BP: 118/66 Patient Position (if appropriate): Sitting Oxygen Therapy SpO2: 97 % O2 Device: Room Air ADL:       Therapy/Group: Individual Therapy  Ama Mcmaster A Brenda Cowher 01/06/2019, 4:23  PM

## 2019-01-06 NOTE — Progress Notes (Signed)
 PHYSICAL MEDICINE & REHABILITATION PROGRESS NOTE   Subjective/Complaints:  Patient feels well this morning.  No complaints. Objective:  Physical Exam: Vital Signs Blood pressure 119/72, pulse (!) 44, temperature 97.8 F (36.6 C), temperature source Oral, resp. rate 16, height 5\' 11"  (1.803 m), weight 95.3 kg, SpO2 95 %.   well-developed well-nourished male in no acute distress. HEENT exam atraumatic, normocephalic, neck supple without jugular venous distention. Chest clear to auscultation cardiac exam S1-S2 are regular. Abdominal exam overweight with bowel sounds, soft and nontender. Extremities no edema. Neurologic exam is alert with a normal gait.  Assessment/Plan: 1. 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 Status post TPA. Patient refused loop recorder CIR PT, OT. SLP  2. Antithrombotics: -DVT/anticoagulation:SCDs -antiplatelet therapy: Aspirin 81 mg daily 3. Pain Management:Tylenol as needed 4. Mood:Provide emotional support -antipsychotic agents: N/A 5. Neuropsych: This patientiscapable of making decisions on hisown behalf. 6. Skin/Wound Care:Routine skin checks 7. Fluids/Electrolytes/Nutrition:Routine in and outs with follow-up chemistries -encourage PO 8.Diabetes mellitus. Hemoglobin A1c 13.3. Novolog7units TID,Lantus insulin 30units daily.  CBG (last 3)  Recent Labs    01/05/19 1708 01/05/19 2159 01/06/19 0617  GLUCAP 234* 191* 123*  variable control  Will follow today 9. Permissive Hypertension. Currently on no medications. Vitals:   01/05/19 1945 01/06/19 0458  BP: 133/83 119/72  Pulse: 61 (!) 44  Resp: 16 16  Temp: 98 F (36.7 C) 97.8 F (36.6 C)  SpO2: 97% 95%  Note bradycardia.  He  is not on any current medications which would contribute to bradycardia.  We will continue to follow. 10.Medical noncompliance. Counseling 11.  Constipation resolved  LOS: 13 days A FACE TO FACE EVALUATION WAS PERFORMED  Bruce H Swords 01/06/2019, 7:06 AM

## 2019-01-06 NOTE — Progress Notes (Signed)
Physical Therapy Session Note  Patient Details  Name: Robert Brewer MRN: [id number] Date of Birth: Sep 04, 1945  Today's Date: 01/06/2019 PT Individual Time: 1105-1205 PT Individual Time Calculation (min): 60 min   Short Term Goals: Week 2:  PT Short Term Goal 1 (Week 2): Pt will perform bed mobility with no more than min assist WITHOUT bed features PT Short Term Goal 2 (Week 2): Pt will perform bed<>chair transfers with CGA using LRAD PT Short Term Goal 3 (Week 2): Pt will ambulate 63f using LRAD with min assist PT Short Term Goal 4 (Week 2): Pt will ascend/descend 4 steps using handrails with min assist  Skilled Therapeutic Interventions/Progress Updates: Pt presented in bed agreeable to therapy. Pt donned shoes and AFO with increased time and set up. Performed stand pivot transfer to w/c min guard. Pt propelled supervision level to rehab gym via hemi technique. Participated in step ups/downs on 4in step in parallel bars. Pt was able to perform with min guard x 6. Pt ambulated x 280fwith RW and minA due to poor positioning of RLE to mat. Participated in LAQ x 10, standing hip abd/add 2 x 10 for NMR via forced use of RLE. Pt then transferred to sidelying and performed knee flexion/extension with powder board 2 x 10. Pt also participated in hip flexion/extension 2 x 10, powder board removed and pt turned to supine supervision to perform bridges 2 x 10. Pt transferred to sitting EOM with supervision and increased time. Pt ambulated 11099fith RW and minA. Pt required verbal cues for engaging RLE on stance phase and to keep in close proximity of RW. Pt propelled w/c remaining distance and remained in w/c at end of session with seat alarm on and needs met.     Therapy Documentation Precautions:  Precautions Precautions: Fall Restrictions Weight Bearing Restrictions: No    Therapy/Group: Individual Therapy  Hawa Henly  Germaine Shenker, PTA  01/06/2019, 12:22 PM

## 2019-01-07 ENCOUNTER — Inpatient Hospital Stay (HOSPITAL_COMMUNITY): Payer: Medicare HMO | Admitting: Occupational Therapy

## 2019-01-07 ENCOUNTER — Inpatient Hospital Stay (HOSPITAL_COMMUNITY): Payer: Medicare HMO | Admitting: Physical Therapy

## 2019-01-07 LAB — GLUCOSE, CAPILLARY
Glucose-Capillary: 86 mg/dL (ref 70–99)
Glucose-Capillary: 103 mg/dL — ABNORMAL HIGH (ref 70–99)
Glucose-Capillary: 107 mg/dL — ABNORMAL HIGH (ref 70–99)
Glucose-Capillary: 152 mg/dL — ABNORMAL HIGH (ref 70–99)

## 2019-01-07 NOTE — Progress Notes (Signed)
Physical Therapy Session Note  Patient Details  Name: Robert Brewer MRN: [id number] Date of Birth: [birth year]  Today's Date: 01/07/2019 PT Individual Time: 0920-1030 PT Individual Time Calculation (min): 70 min   Short Term Goals: Week 2:  PT Short Term Goal 1 (Week 2): Pt will perform bed mobility with no more than min assist WITHOUT bed features PT Short Term Goal 2 (Week 2): Pt will perform bed<>chair transfers with CGA using LRAD PT Short Term Goal 3 (Week 2): Pt will ambulate 68f using LRAD with min assist PT Short Term Goal 4 (Week 2): Pt will ascend/descend 4 steps using handrails with min assist  Skilled Therapeutic Interventions/Progress Updates:  Pt presented in w/c agreeable to therapy. Pt propelled w/c to rehab gym supervision level. Stand pivot transfer no AD to mat and mod A to high kneeling. Participated in high kneeling activities including low to high and resting elbows on bench. Attempted to perform hip extension however unable at this point. Pt able to roll to L and return to EOM with minA. Performed toe taps over threshold x 10 bilaterally for coordination of RLE and to engage R quad when performed with LLE. Also performed toe taps to 4in stepPerformed STS 2 x 5 bot BLE strengthening with bias for RLE. Gait training 1053fwith RW minA fading to CGA with improved coordination noted of RLE and verbal cues for increased quad activation in stance phase. Performed TKE in standing 2 x10 with PTA's hand placed behind knee for biofeedback of full extension. Pt performed x 2 bouts of horseshoes without AD on level surface for static balance with pt able to perform reaching with moderate challenges and no LOB. Pt transferred to w/c and "walked" w/c x 10096fith BLE for RLE strengthening. PTA transported pt remaining distance and pt remained in w/c at end of session with seat alarm on, call bell within reach and needs met.      Therapy Documentation Precautions:   Precautions Precautions: Fall Restrictions Weight Bearing Restrictions: No General:   Vital Signs: Therapy Vitals Temp: 98.6 F (37 C) Temp Source: Oral Pulse Rate: 65 Resp: 14 BP: 115/68 Patient Position (if appropriate): Sitting Oxygen Therapy SpO2: 96 % O2 Device: Room Air Pain:   Mobility:   Locomotion :    Trunk/Postural Assessment :    Balance:   Exercises:   Other Treatments:      Therapy/Group: Individual Therapy  [name]  [name], PTA  01/07/2019, 4:15 PM

## 2019-01-07 NOTE — Progress Notes (Signed)
Occupational Therapy Session Note  Patient Details  Name: Robert Brewer MRN: [id number] Date of Birth: [birth year]  Today's Date: 01/07/2019 OT Individual Time: 4193-7902 and 1450-1533 OT Individual Time Calculation (min): 74 min and 43 min  Short Term Goals: Week 2:  OT Short Term Goal 1 (Week 2): STGs=LTGs secondary to upcoming discharge  Skilled Therapeutic Interventions/Progress Updates:    Pt greeted in w/c with no c/o pain. Wanting to do his laundry. Pt rolled w/c down hallway while carrying his laundry bag. Steady assist sit<stand at washer. Pt placed Rt hand on top of washer for weightbearing with vcs for joint protection. Rt sided weight shift when loading clothes into washer when bag was placed on top of dryer. Afterwards he self propelled to dayroom. Sit<stand with steady assist to elevated table. Pt stood with close supervision while eating lunch. OT facilitated Rt hand grasp on plate and incorporating limb as gross stabilizer. Mod-Max A for bringing Rt hand to mouth to eat his bread roll. Pt with developing strength in Rt bicep. He stood for 17 minutes. Mirror used for visual feedback for equal LE weightbearing, as he'd sometimes offload R LE. After lunch, pt self propelled back to room and completed stand pivot transfer<shower chair with Min A. Worked on controlled sit<stands without UE support for trunk strengthening and also for Rt joint protection. He will not have grab bars in shower at home to assist him with standing. Pt required Mod-Max A for power ups. Mod A for control when lowering. Worked on scooting forward, rocking, nose over toes, R LE placement, and core activation. The shower chair was placed facing toilet at this time so OT could sit in front to provide needed assist. He then ambulated with Min A using RW to bed. Pt transferred in and out of flat bed without bedrails with close supervision. Vcs provided for scooting back vs lying down edge of bed. Continued working on  sit<stands without UE support from bed (setup to height at home). Pt required Mod A for 1 out of 4 stands, Mod-Max for the others with the same vcs provided. At end of session pt ambulated back to w/c in manner as written above. Pt left in w/c with all needs within reach and chair alarm set.      2nd Session 1:1 tx (43 min) Pt greeted in w/c. Rt shoulder pain reported to be manageable for tx. He self propelled to the laundry room. Dynamic balance challenges with pt reaching forward and towards Rt to remove his clothes from dryer. OT held the bag and provided vcs for locking brakes prior to dynamic activity. He propelled the w/c back to room to deliver his clothes, and then ___ ft towards therapy apartment. We practiced simulated walk in shower transfers with ledge. The shower chair was set up kiddy-cornered towards the Lt, elevated to height of chair at home. Pt practiced three of these transfers with Min A using RW. Vcs required for leading in with L LE, and leading out with R LE. Vcs also required for backing up with Rt leg fully at shower ledge and to transfer surfaces. Discussed installing grab bars and purchasing nonslip surface for shower floor. Had him practice lateral lean towards Rt and anterior/Rt lean for seated pericare. He states that his balance will be improved enough at d/c for showering while standing. We discussed having an open mind in regards to his functional status at time of d/c. He then self propelled back to room. Left pt  with all needs within reach and chair alarm set.   Therapy Documentation Precautions:  Precautions Precautions: Fall Restrictions Weight Bearing Restrictions: No Vital Signs: Therapy Vitals Temp: 98.6 F (37 C) Temp Source: Oral Pulse Rate: 65 Resp: 14 BP: 115/68 Patient Position (if appropriate): Sitting Oxygen Therapy SpO2: 96 % O2 Device: Room Air ADL:        Therapy/Group: Individual Therapy  Jaidyn Kuhl A Trayshawn Durkin 01/07/2019, 4:11 PM

## 2019-01-07 NOTE — Progress Notes (Signed)
[facility] PHYSICAL MEDICINE & REHABILITATION PROGRESS NOTE   Subjective/Complaints:  No issues overnite trying to move RUE more  Review of systems denies chest pain shortness of breath, nausea vomiting diarrhea,  Objective:   No results found. No results for input(s): WBC, HGB, HCT, PLT in the last 72 hours. No results for input(s): NA, K, CL, CO2, GLUCOSE, BUN, CREATININE, CALCIUM in the last 72 hours.  Intake/Output Summary (Last 24 hours) at 01/07/2019 0828 Last data filed at 01/07/2019 0511 Gross per 24 hour  Intake 772 ml  Output 1600 ml  Net -828 ml     Physical Exam: Vital Signs Blood pressure 114/67, pulse 65, temperature 98 F (36.7 C), temperature source Oral, resp. rate 19, height 5\' 11"  (1.803 m), weight 95 kg, SpO2 95 %.   General: No acute distress Mood and affect are appropriate Heart: Regular rate and rhythm no rubs murmurs or extra sounds Lungs: Clear to auscultation, breathing unlabored, no rales or wheezes Abdomen: Positive bowel sounds, soft nontender to palpation, nondistended Extremities: No clubbing, cyanosis, or edema Skin: No evidence of breakdown, no evidence of rash Neurologic: Cranial nerves II through XII intact, motor strength is 5/5 in LEFT  deltoid, bicep, tricep, grip, hip flexor, knee extensors, ankle dorsiflexor and plantar flexor 2- RIght biceps and finger flexors,2- sup/pron forearm  3- Left hip flexion and 3/5 hip /knee ext synergy, 0 at ankle  Cerebellar exam normal finger to nose to finger as well as heel to shin in left upper and lower extremities Musculoskeletal: Full range of motion in all 4 extremities. No joint swelling   Assessment/Plan: 1. 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  Physiatrist is providing close team supervision and 24 hour management of active medical problems listed  below.  Physiatrist and rehab team continue to assess barriers to discharge/monitor patient progress toward functional and medical goals  Care Tool:  Bathing  Bathing activity did not occur: Refused Body parts bathed by patient: Right arm, Chest, Abdomen, Front perineal area, Buttocks, Right upper leg, Left upper leg, Right lower leg, Left lower leg, Face   Body parts bathed by helper: Left arm     Bathing assist Assist Level: Minimal Assistance - Patient > 75%     Upper Body Dressing/Undressing Upper body dressing   What is the patient wearing?: Pull over shirt    Upper body assist Assist Level: Supervision/Verbal cueing    Lower Body Dressing/Undressing Lower body dressing      What is the patient wearing?: Pants     Lower body assist Assist for lower body dressing: Minimal Assistance - Patient > 75%     Toileting Toileting Toileting Activity did not occur (Clothing management and hygiene only): Refused  Toileting assist Assist for toileting: Moderate Assistance - Patient 50 - 74%     Transfers Chair/bed transfer  Transfers assist  Chair/bed transfer activity did not occur: Safety/medical concerns  Chair/bed transfer assist level: Contact Guard/Touching assist     Locomotion Ambulation   Ambulation assist   Ambulation activity did not occur: Safety/medical concerns  Assist level: Minimal Assistance - Patient > 75% Assistive device: Walker-rolling Max distance: 128ft   Walk 10 feet activity   Assist  Walk 10 feet activity did not occur: Safety/medical concerns  Assist level: Minimal Assistance - Patient > 75% Assistive device: Walker-rolling   Walk 50 feet activity   Assist Walk 50 feet  with 2 turns activity did not occur: Safety/medical concerns  Assist level: Minimal Assistance - Patient > 75% Assistive device: Walker-rolling    Walk 150 feet activity   Assist Walk 150 feet activity did not occur: Safety/medical concerns  Assist  level: Minimal Assistance - Patient > 75% Assistive device: Walker-rolling    Walk 10 feet on uneven surface  activity   Assist Walk 10 feet on uneven surfaces activity did not occur: Safety/medical concerns         Wheelchair     Assist Will patient use wheelchair at discharge?: Yes Type of Wheelchair: Manual    Wheelchair assist level: Supervision/Verbal cueing Max wheelchair distance: 200    Wheelchair 50 feet with 2 turns activity    Assist        Assist Level: Supervision/Verbal cueing   Wheelchair 150 feet activity     Assist     Assist Level: Supervision/Verbal cueing    Medical Problem List and Plan: 1.Right side weakness with facial droop and dysarthriasecondary to 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 Status post TPA. Patient refused loop recorder CIR PT, OT. SLP  2. Antithrombotics: -DVT/anticoagulation:SCDs -antiplatelet therapy: Aspirin 81 mg daily 3. Pain Management:Tylenol as needed 4. Mood:Provide emotional support -antipsychotic agents: N/A 5. Neuropsych: This patientiscapable of making decisions on hisown behalf. 6. Skin/Wound Care:Routine skin checks 7. Fluids/Electrolytes/Nutrition:Routine in and outs with follow-up chemistries -encourage PO 8.Diabetes mellitus. Hemoglobin A1c 13.3. Novolog7units TID,Lantus insulin 30units daily.  CBG (last 3)  Recent Labs    01/06/19 1615 01/06/19 2149 01/07/19 0627  GLUCAP 139* 182* 86  Overall in good range,  -diabetic education 9. Permissive Hypertension. Monitor with increased mobility. Patient on no prescription medications prior to admission Vitals:   01/06/19 1952 01/07/19 0510  BP: 130/68 114/67  Pulse: 62 65  Resp: 16 19  Temp: 97.8 F (36.6 C) 98 F (36.7 C)  SpO2: 95% 95%  Controlled 6/15 10.Medical noncompliance. Counseling 11.  Constipation improved,  >1BM per day   LOS: 14 days A FACE TO FACE EVALUATION WAS PERFORMED  [name] 01/07/2019, 8:28 AM

## 2019-01-08 ENCOUNTER — Inpatient Hospital Stay (HOSPITAL_COMMUNITY): Payer: Medicare HMO | Admitting: Physical Therapy

## 2019-01-08 ENCOUNTER — Inpatient Hospital Stay (HOSPITAL_COMMUNITY): Payer: Medicare HMO | Admitting: Occupational Therapy

## 2019-01-08 LAB — GLUCOSE, CAPILLARY
Glucose-Capillary: 85 mg/dL (ref 70–99)
Glucose-Capillary: 102 mg/dL — ABNORMAL HIGH (ref 70–99)
Glucose-Capillary: 181 mg/dL — ABNORMAL HIGH (ref 70–99)
Glucose-Capillary: 160 mg/dL — ABNORMAL HIGH (ref 70–99)

## 2019-01-08 NOTE — Progress Notes (Signed)
Occupational Therapy Session Note  Patient Details  Name: Robert Brewer MRN: [id number] Date of Birth: [birth year]  Today's Date: 01/08/2019 OT Individual Time: 1046-1200 OT Individual Time Calculation (min): 74 min    Short Term Goals: Week 2:  OT Short Term Goal 1 (Week 2): STGs=LTGs secondary to upcoming discharge  Skilled Therapeutic Interventions/Progress Updates:   Upon entering the room, pt seated in wheelchair awaiting therapist arrival. Pt with no c/o pain this session. Pt propelled wheelchair independently with hemiplegic technique to the day room. OT discussed home set up in preparation for discharge. 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 OT attempting to provide insight to patient regarding realistically what he should be able to do safely at supervision level at home. OT did not recommend pt being able to walk down to basement and clean/change 40 litter boxes for cats. Pt does not agree with therapist over this concerns. SW notified of concerns as well. Session focused on R UE NMR for the remainder of time with PROM and AAROM to work on strengthening and control of all planes of movement. Pt ambulating 120' back to room with min guard and use of RW. Pt seated in wheelchair with chair alarm belt donned and activated. Call bell and all needed items within reach upon exiting the room.    Therapy Documentation Precautions:  Precautions Precautions: Fall Restrictions Weight Bearing Restrictions: No Vital Signs: Therapy Vitals Temp: 99 F (37.2 C) Temp Source: Oral Pulse Rate: 62 Resp: 18 BP: 120/76 Patient Position (if appropriate): Sitting Oxygen Therapy SpO2: 97 % O2 Device: Room Air   Therapy/Group: Individual  Therapy  [name] 01/08/2019, 5:10 PM

## 2019-01-08 NOTE — Progress Notes (Signed)
[facility] PHYSICAL MEDICINE & REHABILITATION PROGRESS NOTE   Subjective/Complaints:  Pt without new issues seen during PT, pt moving Right fingers and forearm better  Review of systems denies chest pain shortness of breath, nausea vomiting diarrhea,  Objective:   No results found. No results for input(s): WBC, HGB, HCT, PLT in the last 72 hours. No results for input(s): NA, K, CL, CO2, GLUCOSE, BUN, CREATININE, CALCIUM in the last 72 hours.  Intake/Output Summary (Last 24 hours) at 01/08/2019 0821 Last data filed at 01/08/2019 0802 Gross per 24 hour  Intake 776 ml  Output 1000 ml  Net -224 ml     Physical Exam: Vital Signs Blood pressure 115/65, pulse 60, temperature 98.3 F (36.8 C), temperature source Oral, resp. rate 16, height 5\' 11"  (1.803 m), weight 95.2 kg, SpO2 95 %.   General: No acute distress Mood and affect are appropriate Heart: Regular rate and rhythm no rubs murmurs or extra sounds Lungs: Clear to auscultation, breathing unlabored, no rales or wheezes Abdomen: Positive bowel sounds, soft nontender to palpation, nondistended Extremities: No clubbing, cyanosis, or edema Skin: No evidence of breakdown, no evidence of rash Neurologic: Cranial nerves II through XII intact, motor strength is 5/5 in LEFT  deltoid, bicep, tricep, grip, hip flexor, knee extensors, ankle dorsiflexor and plantar flexor 3- RIght biceps and finger flexors,2- sup/pron, trace thumb ext  forearm  3- Left hip flexion and 3/5 hip /knee ext synergy, 0 at ankle  Cerebellar exam normal finger to nose to finger as well as heel to shin in left upper and lower extremities Musculoskeletal: Full range of motion in all 4 extremities. No joint swelling   Assessment/Plan: 1. 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  Physiatrist is providing close team supervision and 24 hour  management of active medical problems listed below.  Physiatrist and rehab team continue to assess barriers to discharge/monitor patient progress toward functional and medical goals  Care Tool:  Bathing  Bathing activity did not occur: Refused Body parts bathed by patient: Right arm, Chest, Abdomen, Front perineal area, Buttocks, Right upper leg, Left upper leg, Right lower leg, Left lower leg, Face   Body parts bathed by helper: Left arm     Bathing assist Assist Level: Minimal Assistance - Patient > 75%     Upper Body Dressing/Undressing Upper body dressing   What is the patient wearing?: Pull over shirt    Upper body assist Assist Level: Supervision/Verbal cueing    Lower Body Dressing/Undressing Lower body dressing      What is the patient wearing?: Pants     Lower body assist Assist for lower body dressing: Minimal Assistance - Patient > 75%     Toileting Toileting Toileting Activity did not occur (Clothing management and hygiene only): Refused  Toileting assist Assist for toileting: Moderate Assistance - Patient 50 - 74%     Transfers Chair/bed transfer  Transfers assist  Chair/bed transfer activity did not occur: Safety/medical concerns  Chair/bed transfer assist level: Contact Guard/Touching assist     Locomotion Ambulation   Ambulation assist   Ambulation activity did not occur: Safety/medical concerns  Assist level: Minimal Assistance - Patient > 75% Assistive device: Walker-rolling Max distance: 119ft   Walk 10 feet activity   Assist  Walk 10 feet activity did not occur: Safety/medical concerns  Assist level: Minimal Assistance - Patient > 75% Assistive device: Walker-rolling  Walk 50 feet activity   Assist Walk 50 feet with 2 turns activity did not occur: Safety/medical concerns  Assist level: Minimal Assistance - Patient > 75% Assistive device: Walker-rolling    Walk 150 feet activity   Assist Walk 150 feet activity did not  occur: Safety/medical concerns  Assist level: Minimal Assistance - Patient > 75% Assistive device: Walker-rolling    Walk 10 feet on uneven surface  activity   Assist Walk 10 feet on uneven surfaces activity did not occur: Safety/medical concerns         Wheelchair     Assist Will patient use wheelchair at discharge?: Yes Type of Wheelchair: Manual    Wheelchair assist level: Supervision/Verbal cueing Max wheelchair distance: 200    Wheelchair 50 feet with 2 turns activity    Assist        Assist Level: Supervision/Verbal cueing   Wheelchair 150 feet activity     Assist     Assist Level: Supervision/Verbal cueing    Medical Problem List and Plan: 1.Right side weakness with facial droop and dysarthriasecondary to 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 Status post TPA. Patient refused loop recorder CIR PT, OT. SLP  2. Antithrombotics: -DVT/anticoagulation:SCDs -antiplatelet therapy: Aspirin 81 mg daily 3. Pain Management:Tylenol as needed 4. Mood:Provide emotional support -antipsychotic agents: N/A 5. Neuropsych: This patientiscapable of making decisions on hisown behalf. 6. Skin/Wound Care:Routine skin checks 7. Fluids/Electrolytes/Nutrition:Routine in and outs with follow-up chemistries -encourage PO 8.Diabetes mellitus. Hemoglobin A1c 13.3. Novolog7units TID,Lantus insulin 30units daily.  CBG (last 3)  Recent Labs    01/07/19 1733 01/07/19 2118 01/08/19 0622  GLUCAP 107* 152* 85  Overall in good range,  -diabetic education 9. Permissive Hypertension. Monitor with increased mobility. Patient on no prescription medications prior to admission Vitals:   01/07/19 1932 01/08/19 0543  BP: 97/73 115/65  Pulse: 65 60  Resp: 16 16  Temp: 98 F (36.7 C) 98.3 F (36.8 C)  SpO2: 98% 95%  Controlled 6/16 10.Medical noncompliance.  Counseling 11.  Constipation improved, >1BM per day   LOS: 15 days A FACE TO FACE EVALUATION WAS PERFORMED  [name] 01/08/2019, 8:21 AM

## 2019-01-08 NOTE — Progress Notes (Signed)
Physical Therapy Session Note  Patient Details  Name: Robert Brewer MRN: [id number] Date of Birth: 1945-08-13  Today's Date: 01/08/2019 PT Individual Time: 0803-0900 and 1400-1500 PT Individual Time Calculation (min): 57 min and 60 min  Short Term Goals: Week 2:  PT Short Term Goal 1 (Week 2): Pt will perform bed mobility with no more than min assist WITHOUT bed features PT Short Term Goal 2 (Week 2): Pt will perform bed<>chair transfers with CGA using LRAD PT Short Term Goal 3 (Week 2): Pt will ambulate 45f using LRAD with min assist PT Short Term Goal 4 (Week 2): Pt will ascend/descend 4 steps using handrails with min assist  Skilled Therapeutic Interventions/Progress Updates: Pt presented in w/c donning shoes and AFO with set up, no complaints of pain throughout session. Pt propelled to rehab gym and discussed/problem solved home entry. Per pt has gravel, then paver step to porch. Front entrance is 3 steps without rails. Discussed use of RW to ascend backwards vs step to gravel. Pt indicated that he felt he would be at cane level prior to d/c. Provided edu on safety aspect and the due to decreased knee stability increased fall risk would NOT be safe current to d/c with cane. Pt feels more comfortable ambulating to paver/porch. PTA set up 2in step to curb with threshold placed to simulate home entry. Pt was able to complete entry with minA and was able to demonstrate safety with RW. Pt did require increased time and multiple attempts to clear LLE over threshold. Pt then performed blocked practice of clearing threshold with leading with R foot. Pt was able to clear LLE over threshold 4/6 attempts. PTA then provided [facility] to demonstrate planned failure. Pt ambulated x 15 ft with modA and x1 LOB requiring assist for recovery.Pt ambulated back to room with CGA with RW and CGA with only 2 occurences of R foot catching on floor. Pt transferred to w/c and left with seat alarm on and all needs met.    Tx2: Pt presented in w/c agreeable to therapy. Session focused on gait training and obstacle negotiation and R NMR via forced use. Pt propelled to rehab gym supervision level. Pt participated in obstacle course [id number]. Pt required to weave through cones and step over thresholds. Pt was able to safely negotiate around cones with CGA however required min to modA to step over wide threshold (balance board). Pt was able to step over small threshold (walking stick) with minA and mod verbal cues for sequencing. Pt demonstrated difficulty clearing LLE over wide threshold 3/4 times. Pt then ambulated to high/low mat and performed R NMR via forced use. Pt was able to transfer to sidelying supervision and increased time and performed therex to fatigue (avg 15-20) as follows:  Knee flexion/extension Hip flexion Hip extension Pt then transferred to prone with supervision and increased time and performed knee extension AAROM x 10. Pt was able to turn to supine with supervision and increased time and perform supine bridges with adductor squeeze, single leg bridges with emphasis on RLE, hip er with level 1 resistance band. [facility] and performed transfer to w/c CGA. Pt propelled back to room and remained in w/c at end of session with seat alarm on, and needs met.     Therapy Documentation Precautions:  Precautions Precautions: Fall Restrictions Weight Bearing Restrictions: No General:   Vital Signs: Therapy Vitals Temp: 99 F (37.2 C) Temp Source: Oral Pulse Rate: 62 Resp:  18 BP: 120/76 Patient Position (if appropriate): Sitting Oxygen Therapy SpO2: 97 % O2 Device: Room Air Pain:   Mobility:   Locomotion :    Trunk/Postural Assessment :    Balance:   Exercises:   Other Treatments:      Therapy/Group: Individual Therapy  Robert Brewer 01/08/2019, 4:05 PM

## 2019-01-08 NOTE — Progress Notes (Signed)
Physical Therapy Weekly Progress Note  Patient Details  Name: Robert Brewer MRN: [id number] Date of Birth: [birth year]  Beginning of progress report period: January 01, 2019 End of progress report period: January 08, 2019  Today's Date: 01/08/2019   Patient has met 3 of 4 short term goals.  Pt has continued to demonstrate progression during current week of therapy. Pt now has AFO to assist with DF and R knee instability which has allow pt to progress ambulation quality with decreased assistance. Pt continues to demonstrate decreased coordination of RLE however has improved awareness. Pt is able to perform x 1 step with RW to simulate entry into home however continues to require assist to safely cross thresholds. Pt is motivated and continues to benefit from skilled PT.   Patient continues to demonstrate the following deficits muscle weakness and muscle paralysis and impaired timing and sequencing, abnormal tone, unbalanced muscle activation, ataxia, decreased coordination and decreased motor planning and therefore will continue to benefit from skilled PT intervention to increase functional independence with mobility.  Patient progressing toward long term goals..  Continue plan of care.  PT Short Term Goals Week 2:  PT Short Term Goal 1 (Week 2): Pt will perform bed mobility with no more than min assist WITHOUT bed features PT Short Term Goal 1 - Progress (Week 2): Met PT Short Term Goal 2 (Week 2): Pt will perform bed<>chair transfers with CGA using LRAD PT Short Term Goal 2 - Progress (Week 2): Met PT Short Term Goal 3 (Week 2): Pt will ambulate 7f using LRAD with min assist PT Short Term Goal 3 - Progress (Week 2): Met PT Short Term Goal 4 (Week 2): Pt will ascend/descend 4 steps using handrails with min assist PT Short Term Goal 4 - Progress (Week 2): Partly met Week 3:  PT Short Term Goal 1 (Week 3): STG=LTG due to ELOS  Skilled Therapeutic Interventions/Progress Updates:      Therapy  Documentation Precautions:  Precautions Precautions: Fall Restrictions Weight Bearing Restrictions: No Vital Signs: Therapy Vitals Temp: 99 F (37.2 C) Temp Source: Oral Pulse Rate: 62 Resp: 18 BP: 120/76 Patient Position (if appropriate): Sitting Oxygen Therapy SpO2: 97 % O2 Device: Room Air   Therapy/Group: Individual Therapy  Rosita DeChalus 01/08/2019, 4:38 PM

## 2019-01-09 ENCOUNTER — Inpatient Hospital Stay (HOSPITAL_COMMUNITY): Payer: Medicare HMO | Admitting: Occupational Therapy

## 2019-01-09 ENCOUNTER — Inpatient Hospital Stay (HOSPITAL_COMMUNITY): Payer: Medicare HMO | Admitting: Physical Therapy

## 2019-01-09 LAB — GLUCOSE, CAPILLARY
Glucose-Capillary: 104 mg/dL — ABNORMAL HIGH (ref 70–99)
Glucose-Capillary: 97 mg/dL (ref 70–99)
Glucose-Capillary: 96 mg/dL (ref 70–99)
Glucose-Capillary: 152 mg/dL — ABNORMAL HIGH (ref 70–99)

## 2019-01-09 NOTE — Progress Notes (Signed)
Occupational Therapy Session Note  Patient Details  Name: Robert Brewer MRN: [id number] Date of Birth: 08/09/1945  Today's Date: 01/09/2019 OT Individual Time: 1400-1515 OT Individual Time Calculation (min): 75 min    Short Term Goals: Week 2:  OT Short Term Goal 1 (Week 2): STGs=LTGs secondary to upcoming discharge  Skilled Therapeutic Interventions/Progress Updates:    Upon entering the room, pt seated in wheelchair with no c/o pain and motivated for therapy session. Pt propelled wheelchair with hemiplegic technique to ADL apartment with supervision. OT set up simulated shower transfer and pt removing shoes and AFO as he would at home. Pt needing min guard for ambulation with RW and stepping back over threshold with L LE first. Pt needing min A to take additional step back to reach where he believes his shower seat to bed. Pt stepping back over threshold in same manner and seated in wheelchair to don shoes with increased time. Pt transferred onto sofa with RW and min guard and lowering self onto matt with focus on floor transfer.  OT demonstrated floor transfer and provided mod cuing for technique. Pt needing min A to transfer onto bed from floor for safety. Pt has been requesting to try ambulating without use of AD and pt showing limited insight into why this is not safe option. Planned failure moving forward and OT allowing pt to attempt while in parallel bars with mod A needing for balance as pt takes 6 steps. Pt then requesting SPC in which pt continues to need min - mod A for safety. Pt then verbalizing that he must walk outside to get cat in and OT recommended this would not be safe except if planning on doing with therapy staff as caregiver is unable to provide supervision. Pt ambulating on mulch with RW to simulate outside ambulating and needing min A for safety with multiple close calls. Pt continues to show decreased judgement with safety in regards to home. Pt ambulating 240' back to room  with RW and min A for safety. Pt seated in wheelchair with chair alarm activated and call bell within reach.   Therapy Documentation Precautions:  Precautions Precautions: Fall Restrictions Weight Bearing Restrictions: No General:   Vital Signs: Therapy Vitals Temp: 97.7 F (36.5 C) Pulse Rate: (!) 58 Resp: 17 BP: 121/70 Patient Position (if appropriate): Sitting Oxygen Therapy SpO2: 99 % O2 Device: Room Air   Therapy/Group: Individual Therapy  [name] 01/09/2019, 4:40 PM

## 2019-01-09 NOTE — Progress Notes (Signed)
Physical Therapy Session Note  Patient Details  Name: Robert Brewer MRN: [id number] Date of Birth: 23-Dec-1945  Today's Date: 01/09/2019 PT Individual Time: 0917-1029 PT Individual Time Calculation (min): 72 min   Short Term Goals: Week 3:  STG's = LTG's    Skilled Therapeutic Interventions/Progress Updates: Pt presented in w/c agreeable to therapy. Pt propelled w/c to rehab gym supervision. Pt participated in stair training x 12 steps ascending/descending using L rail with cga and min verbal cues for sequencing. Pt participated in RLE strengthening and coordination with step ups forwards and lateral to 4in step x 10 ea.Performed coordination activities performing toe taps to cones in parallel bars with emphasis on foot placement upon returning from cone. Pt participated in prolonged standing on Airex with minimal UE support while pt participated in game of checkers. Pt was able to maintain standing for approx 10 min with reaching and small squatting performed to move objects. Participated in side stepping at wall rail x 15 ft for coordination and R hip strengthening. Ambulated approx 51f with RW and CGA to NuStep with noted improved RLE placement. Participated in NuStep L5 x 7 min for global strengthening and reciprocal activity. Required verbal cues for maintaining R  Knee alignment. Pt transferred to w/c at end of session and propelled back to room. Remained in session while in room and left with seat alarm on and current needs met.      Therapy Documentation Precautions:  Precautions Precautions: Fall Restrictions Weight Bearing Restrictions: No   Therapy/Group: Individual Therapy  Kamani Lewter  Yalda Herd, PTA  01/09/2019, 12:14 PM

## 2019-01-09 NOTE — Progress Notes (Signed)
[place] PHYSICAL MEDICINE & REHABILITATION PROGRESS NOTE   Subjective/Complaints:  No issues overnite, pt moving the Right arm more  Review of systems denies chest pain shortness of breath, nausea vomiting diarrhea,  Objective:   No results found. No results for input(s): WBC, HGB, HCT, PLT in the last 72 hours. No results for input(s): NA, K, CL, CO2, GLUCOSE, BUN, CREATININE, CALCIUM in the last 72 hours.  Intake/Output Summary (Last 24 hours) at 01/09/2019 0941 Last data filed at 01/09/2019 0824 Gross per 24 hour  Intake 956 ml  Output 850 ml  Net 106 ml     Physical Exam: Vital Signs Blood pressure 100/62, pulse (!) 50, temperature 98.1 F (36.7 C), temperature source Oral, resp. rate 16, height 5' 11"  (1.803 m), weight 93.7 kg, SpO2 98 %.   General: No acute distress Mood and affect are appropriate Heart: Regular rate and rhythm no rubs murmurs or extra sounds Lungs: Clear to auscultation, breathing unlabored, no rales or wheezes Abdomen: Positive bowel sounds, soft nontender to palpation, nondistended Extremities: No clubbing, cyanosis, or edema Skin: No evidence of breakdown, no evidence of rash Neurologic: Cranial nerves II through XII intact, motor strength is 5/5 in LEFT  deltoid, bicep, tricep, grip, hip flexor, knee extensors, ankle dorsiflexor and plantar flexor 3- RIght biceps and finger flexors,2- sup/pron, trace thumb ext  forearm  3- Left hip flexion and 3/5 hip /knee ext synergy, 0 at ankle  Cerebellar exam normal finger to nose to finger as well as heel to shin in left upper and lower extremities Musculoskeletal: Full range of motion in all 4 extremities. No joint swelling   Assessment/Plan: 1. 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  Physiatrist is providing close team supervision and 24 hour management of active medical  problems listed below.  Physiatrist and rehab team continue to assess barriers to discharge/monitor patient progress toward functional and medical goals  Care Tool:  Bathing  Bathing activity did not occur: Refused Body parts bathed by patient: Right arm, Chest, Abdomen, Front perineal area, Buttocks, Right upper leg, Left upper leg, Right lower leg, Left lower leg, Face   Body parts bathed by helper: Left arm     Bathing assist Assist Level: Minimal Assistance - Patient > 75%     Upper Body Dressing/Undressing Upper body dressing   What is the patient wearing?: Pull over shirt    Upper body assist Assist Level: Supervision/Verbal cueing    Lower Body Dressing/Undressing Lower body dressing      What is the patient wearing?: Pants     Lower body assist Assist for lower body dressing: Minimal Assistance - Patient > 75%     Toileting Toileting Toileting Activity did not occur (Clothing management and hygiene only): Refused  Toileting assist Assist for toileting: Moderate Assistance - Patient 50 - 74%     Transfers Chair/bed transfer  Transfers assist  Chair/bed transfer activity did not occur: Safety/medical concerns  Chair/bed transfer assist level: Contact Guard/Touching assist     Locomotion Ambulation   Ambulation assist   Ambulation activity did not occur: Safety/medical concerns  Assist level: Minimal Assistance - Patient > 75% Assistive device: Walker-rolling Max distance: 120'   Walk 10 feet activity   Assist  Walk 10 feet activity did not occur: Safety/medical concerns  Assist level: Minimal Assistance - Patient > 75% Assistive device: Walker-rolling   Walk 50 feet activity  Assist Walk 50 feet with 2 turns activity did not occur: Safety/medical concerns  Assist level: Minimal Assistance - Patient > 75% Assistive device: Walker-rolling    Walk 150 feet activity   Assist Walk 150 feet activity did not occur: Safety/medical  concerns  Assist level: Minimal Assistance - Patient > 75% Assistive device: Walker-rolling    Walk 10 feet on uneven surface  activity   Assist Walk 10 feet on uneven surfaces activity did not occur: Safety/medical concerns         Wheelchair     Assist Will patient use wheelchair at discharge?: Yes Type of Wheelchair: Manual    Wheelchair assist level: Supervision/Verbal cueing Max wheelchair distance: 200    Wheelchair 50 feet with 2 turns activity    Assist        Assist Level: Supervision/Verbal cueing   Wheelchair 150 feet activity     Assist     Assist Level: Supervision/Verbal cueing    Medical Problem List and Plan: 1.Right side weakness with facial droop and dysarthriasecondary to 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 Status post TPA. Patient refused loop recorder Team conference today please see physician documentation under team conference tab, met with team face-to-face to discuss problems,progress, and goals. Formulized individual treatment plan based on medical history, underlying problem and comorbidities.  2. Antithrombotics: -DVT/anticoagulation:SCDs -antiplatelet therapy: Aspirin 81 mg daily 3. Pain Management:Tylenol as needed 4. Mood:Provide emotional support -antipsychotic agents: N/A 5. Neuropsych: This patientiscapable of making decisions on hisown behalf. 6. Skin/Wound Care:Routine skin checks 7. Fluids/Electrolytes/Nutrition:Routine in and outs with follow-up chemistries -encourage PO 8.Diabetes mellitus. Hemoglobin A1c 13.3. Novolog7units TID,Lantus insulin 30units daily.  CBG (last 3)  Recent Labs    01/08/19 1649 01/08/19 2126 01/09/19 0631  GLUCAP 181* 160* 104*  Overall in good range,  -diabetic education 9. Permissive Hypertension. Monitor with increased mobility. Patient on no prescription  medications prior to admission Vitals:   01/09/19 0442 01/09/19 0444  BP: 100/62   Pulse: (!) 42 (!) 50  Resp: 16   Temp: 98.1 F (36.7 C)   SpO2: 98%   Controlled 6/17 on no antihypertensive meds 10.Medical noncompliance. Counseling 11.  Constipation improved, >1BM per day   LOS: 16 days A FACE TO FACE EVALUATION WAS PERFORMED  [name] 01/09/2019, 9:41 AM

## 2019-01-09 NOTE — Progress Notes (Signed)
Physical Therapy Session Note  Patient Details  Name: Robert Brewer MRN: [id number] Date of Birth: Aug 20, 1945  Today's Date: 01/09/2019 PT Individual Time: 0800-0843 PT Individual Time Calculation (min): 43 min   Short Term Goals: Week 2:  PT Short Term Goal 1 (Week 2): Pt will perform bed mobility with no more than min assist WITHOUT bed features PT Short Term Goal 1 - Progress (Week 2): Met PT Short Term Goal 2 (Week 2): Pt will perform bed<>chair transfers with CGA using LRAD PT Short Term Goal 2 - Progress (Week 2): Met PT Short Term Goal 3 (Week 2): Pt will ambulate 101f using LRAD with min assist PT Short Term Goal 3 - Progress (Week 2): Met PT Short Term Goal 4 (Week 2): Pt will ascend/descend 4 steps using handrails with min assist PT Short Term Goal 4 - Progress (Week 2): Partly met  Skilled Therapeutic Interventions/Progress Updates:  Pt in bed on arrival to room. Agreeable to therapy at this time. Denies any pain.  While supine in bed pt donned shirt and shorts on own once PTA handed them to him with increased time needed to button shorts. Supine to sitting edge of bed with supervision, HOB 30 degrees and bed rail used. Supervision for sitting edge of bed to don shoes (socks already on) with assist needed to fully clear heel on right with AFO in shoe. Pt able to secure laces due to loop and straps of brace. Min guard assist to stand from bed to RW with right hand orthotic. Stand pivot transfer with min guard assist to wheelchair with min guard assist to sit to wheelchair. Pt able to self maneuver wheelchair features and propelled himself from room to far end of rehab gym.  Min assist to stand stairs in gym: had pt stand with left UE support (attempted right as well however not enough grip strength to stay on rail) for left stance with right foot taps up/down bottom 2 steps. Emphasis placed on hip/knee flexion with each rep vs sliding foot to advance it. Assist needed at times for  right foot clearance. Supervision to sit back in wheelchair and then pt rolling himself to mat table in gym. Min assist for stand pivot transfer with no AD to mat table.   [facility] worked on right LE strengthening/forced use with sit<>stands as follows- in staggered stance with right foot back/left foot forward for 10 reps. Min assist/faciliation for staying centered with each rep with left UE assist for power up and slow, controlled descent. Then with feet in hip width stance with mirror feedback, hand clasped together in center had pt perform 5 reps of sit<>stands with emphasis on maintaining midline and equal LE weight bearing. Min to mod assist with facilitation needed.   Standing next to mat table with chair on left for UE support as needed: initially worked on stepping strategies as follows- right LE forward/backward stepping over foam beam (low height) for 10 reps with emphasis on increased step length and weight shifting. Progressed to higher foam wedge to step over/back for 10 reps with continued emphasis on increased hip/knee flexion and increased step length for clearance of wedge. Pt able to fully clear wedge for 7/10 reps. Min guard assist to min assist for balance. Working on single leg stance/weight shifting with the following activities: right stance with left foot taps to 6 inch box , then left double foot taps to 6 inch box, then left toe>heel>toe taps to 6 inch box, all  for 10 reps each with rest break between. Assist/faciliation for increased weight shifting to right side with guarding/support at right knee for safety. Ended with alternating foot taps to 6 inch box for 10 reps each side with min assist for balance and weight shifting. Min guard assist for squat pivot transfer back to wheelchair from mat table.  Pt propelled himself back to room with supervision. Pt left in room with RN in room, pad alarm on and all needs in reach.   Therapy Documentation Precautions:   Precautions Precautions: Fall Restrictions Weight Bearing Restrictions: No    Therapy/Group: Individual Therapy  Willow Ora, PTA 01/09/2019, 9:41 AM

## 2019-01-10 ENCOUNTER — Inpatient Hospital Stay (HOSPITAL_COMMUNITY): Payer: Medicare HMO | Admitting: Rehabilitation

## 2019-01-10 ENCOUNTER — Inpatient Hospital Stay (HOSPITAL_COMMUNITY): Payer: Medicare HMO | Admitting: Occupational Therapy

## 2019-01-10 ENCOUNTER — Inpatient Hospital Stay (HOSPITAL_COMMUNITY): Payer: Medicare HMO | Admitting: Physical Therapy

## 2019-01-10 LAB — GLUCOSE, CAPILLARY
Glucose-Capillary: 80 mg/dL (ref 70–99)
Glucose-Capillary: 103 mg/dL — ABNORMAL HIGH (ref 70–99)
Glucose-Capillary: 135 mg/dL — ABNORMAL HIGH (ref 70–99)
Glucose-Capillary: 172 mg/dL — ABNORMAL HIGH (ref 70–99)

## 2019-01-10 NOTE — Progress Notes (Signed)
[place] PHYSICAL MEDICINE & REHABILITATION PROGRESS NOTE   Subjective/Complaints:  Discussed bradycardia , pt states he has had HR in 45-50 range.  He is not a runner.  States his brother and father have same HR   Review of systems denies chest pain shortness of breath, nausea vomiting diarrhea,  Objective:   No results found. No results for input(s): WBC, HGB, HCT, PLT in the last 72 hours. No results for input(s): NA, K, CL, CO2, GLUCOSE, BUN, CREATININE, CALCIUM in the last 72 hours.  Intake/Output Summary (Last 24 hours) at 01/10/2019 0823 Last data filed at 01/10/2019 0818 Gross per 24 hour  Intake 944 ml  Output 575 ml  Net 369 ml     Physical Exam: Vital Signs Blood pressure 111/72, pulse (!) 44, temperature 98.3 F (36.8 C), resp. rate 20, height 5\' 11"  (1.803 m), weight 92.8 kg, SpO2 96 %.   General: No acute distress Mood and affect are appropriate Heart: Regular rate and rhythm no rubs murmurs or extra sounds Lungs: Clear to auscultation, breathing unlabored, no rales or wheezes Abdomen: Positive bowel sounds, soft nontender to palpation, nondistended Extremities: No clubbing, cyanosis, or edema Skin: No evidence of breakdown, no evidence of rash Neurologic: Cranial nerves II through XII intact, motor strength is 5/5 in LEFT  deltoid, bicep, tricep, grip, hip flexor, knee extensors, ankle dorsiflexor and plantar flexor 3- RIght biceps and finger flexors,2- sup/pron, trace thumb ext  forearm  3- Left hip flexion and 3/5 hip /knee ext synergy, 0 at ankle  Cerebellar exam normal finger to nose to finger as well as heel to shin in left upper and lower extremities Musculoskeletal: Full range of motion in all 4 extremities. No joint swelling   Assessment/Plan: 1. 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  Physiatrist is providing close team  supervision and 24 hour management of active medical problems listed below.  Physiatrist and rehab team continue to assess barriers to discharge/monitor patient progress toward functional and medical goals  Care Tool:  Bathing  Bathing activity did not occur: Refused Body parts bathed by patient: Right arm, Chest, Abdomen, Front perineal area, Buttocks, Right upper leg, Left upper leg, Right lower leg, Left lower leg, Face   Body parts bathed by helper: Left arm     Bathing assist Assist Level: Minimal Assistance - Patient > 75%     Upper Body Dressing/Undressing Upper body dressing   What is the patient wearing?: Pull over shirt    Upper body assist Assist Level: Supervision/Verbal cueing    Lower Body Dressing/Undressing Lower body dressing      What is the patient wearing?: Pants     Lower body assist Assist for lower body dressing: Minimal Assistance - Patient > 75%     Toileting Toileting Toileting Activity did not occur (Clothing management and hygiene only): Refused  Toileting assist Assist for toileting: Moderate Assistance - Patient 50 - 74%     Transfers Chair/bed transfer  Transfers assist  Chair/bed transfer activity did not occur: Safety/medical concerns  Chair/bed transfer assist level: Contact Guard/Touching assist     Locomotion Ambulation   Ambulation assist   Ambulation activity did not occur: Safety/medical concerns  Assist level: Minimal Assistance - Patient > 75% Assistive device: Walker-rolling Max distance: 240'   Walk 10 feet activity   Assist  Walk 10 feet activity did not occur: Safety/medical concerns  Assist  level: Minimal Assistance - Patient > 75% Assistive device: Walker-rolling   Walk 50 feet activity   Assist Walk 50 feet with 2 turns activity did not occur: Safety/medical concerns  Assist level: Minimal Assistance - Patient > 75% Assistive device: Walker-rolling    Walk 150 feet activity   Assist Walk  150 feet activity did not occur: Safety/medical concerns  Assist level: Minimal Assistance - Patient > 75% Assistive device: Walker-rolling    Walk 10 feet on uneven surface  activity   Assist Walk 10 feet on uneven surfaces activity did not occur: Safety/medical concerns         Wheelchair     Assist Will patient use wheelchair at discharge?: Yes Type of Wheelchair: Manual    Wheelchair assist level: Supervision/Verbal cueing Max wheelchair distance: 200    Wheelchair 50 feet with 2 turns activity    Assist        Assist Level: Supervision/Verbal cueing   Wheelchair 150 feet activity     Assist     Assist Level: Supervision/Verbal cueing    Medical Problem List and Plan: 1.Right side weakness with facial droop and dysarthriasecondary to 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 Status post TPA. Patient refused loop recorder Plan d/c 6/20  2. Antithrombotics: -DVT/anticoagulation:SCDs -antiplatelet therapy: Aspirin 81 mg daily 3. Pain Management:Tylenol as needed 4. Mood:Provide emotional support -antipsychotic agents: N/A 5. Neuropsych: This patientiscapable of making decisions on hisown behalf. 6. Skin/Wound Care:Routine skin checks 7. Fluids/Electrolytes/Nutrition:Routine in and outs with follow-up chemistries -encourage PO 8.Diabetes mellitus. Hemoglobin A1c 13.3. Novolog7units TID,Lantus insulin 30units daily.  CBG (last 3)  Recent Labs    01/09/19 1632 01/09/19 2149 01/10/19 0629  GLUCAP 96 152* 80  Overall in good range,  -diabetic education 9. Permissive Hypertension. Monitor with increased mobility. Patient on no prescription medications prior to admission Vitals:   01/09/19 1928 01/10/19 0532  BP: 116/66 111/72  Pulse: (!) 53 (!) 44  Resp: 20 20  Temp: 98.5 F (36.9 C) 98.3 F (36.8 C)  SpO2: 98% 96%  Controlled 6/18  no need for meds, asymptomatic brady  10.Medical noncompliance. Counseling 11.  Constipation improved, >1BM per day   LOS: 17 days A FACE TO FACE EVALUATION WAS PERFORMED  [name] 01/10/2019, 8:23 AM

## 2019-01-10 NOTE — Progress Notes (Signed)
Occupational Therapy Session Note  Patient Details  Name: Robert Brewer MRN: [id number] Date of Birth: February 12, 1946  Today's Date: 01/10/2019 OT Individual Time: 9169-4503 OT Individual Time Calculation (min): 59 min    Short Term Goals: Week 2:  OT Short Term Goal 1 (Week 2): STGs=LTGs secondary to upcoming discharge  Skilled Therapeutic Interventions/Progress Updates:    Pt completed functional mobility down to the dayroom with min assist using the RW with hand splint on the right.  Once in the dayroom had pt sit at the table and begin with AAROM table slides using washcloth under the right hand.  Mod facilitation needed for pt to maintain upright posture and avoid shoulder hike and trunk rotation when completing functional movement.  He was able to activate elbow extensors some but needed assist when having to reach forward and forward to the right.  Therapist applied NMES to the right digit extensors as well as pt demonstrates gross grasp and 50% release.  He was able to tolerate 20 mins of stimulation with intensity set on level 36 and PPS at 35 with pulse width at 300.  Time on 10 seconds, off 5 seconds.  Had pt work on assisting with digit extension along with the stimulation as well as performing digit flexion when stimulation was not active.  Also incorporated table slides as well with digit extension as pre-requisite to functional reaching to target.  Finished session with ambulation back to the room with min assist using the RW for support.  Pt left sitting up in the wheelchair with call button and phone in reach with safety belt in place.    Therapy Documentation Precautions:  Precautions Precautions: Fall Restrictions Weight Bearing Restrictions: No  Pain: Pain Assessment Pain Scale: Faces Pain Score: 0-No pain ADL: See Care Tool Section for some details of ADL  Therapy/Group: Individual Therapy  Harsha Yusko  OTR/L 01/10/2019, 4:02 PM

## 2019-01-10 NOTE — Progress Notes (Signed)
Physical Therapy Session Note  Patient Details  Name: Robert Brewer MRN: [id number] Date of Birth: 07/20/1946  Today's Date: 01/10/2019 PT Individual Time: 0800-0915 PT Individual Time Calculation (min): 75 min   Short Term Goals: Week 3:  PT Short Term Goal 1 (Week 3): STG=LTG due to ELOS  Skilled Therapeutic Interventions/Progress Updates:   Pt received sitting in WC and agreeable to PT. WC mobility x 254f with distant supervision assist from PT with min cues for safety through doors to avoid obstacles on the R.   Gait training with RW x 1836fwith close supervision assist with 1 instance of min assist to prevent R LOB due to poor hip and knee control. Cues for improved hip flexion to improve foot clearance and reduce compensations from trunk.   Stand pivot transfers completed without AD x 2 with CGA assist and moderate cues for gait pattern and posture. Sit<>supine with supervision assist with cues for improved isolation of RLE and reduced trunk compensation.   PT instructed pt in supine neuromuscular re-ed. SAQ x 12  Bridges x 15  Bridge through thighs on bolster x 8.  Clam shells with level 1 tband x 15  Reciprocal marching x 12 with level 1 tband.  Heel slides x 15  SLR x 10 BLE with AAROM on the RLE.  Cues for decreased speed of eccentric movement, decreased trunk compensations, and full, symmetrical ROM on R and L.   Standing NMR  Foot taps with the LLE on 4inch step to force improved WB through the RLE x 5. Pt required max assist to prevent fall on first attempt due to hip and knee instability.  Blocked practice sit<>stand with LLE elevated surface x 5 on 1.5 inch and x 6 from 2 inches. CGA-min assist for forced R improved WB on the RLE.   Patient returned to room and [facility] in reach and all needs met.             Therapy Documentation Precautions:  Precautions Precautions: Fall Restrictions Weight Bearing Restrictions:  No General:   Vital Signs: Therapy Vitals Temp: 98.3 F (36.8 C) Pulse Rate: (!) 44 Resp: 20 BP: 111/72 Patient Position (if appropriate): Lying Oxygen Therapy SpO2: 96 % O2 Device: Room Air Pain:   Mobility:   Locomotion :    Trunk/Postural Assessment :    Balance:   Exercises:   Other Treatments:      Therapy/Group: Individual Therapy  AuLorie Phenix/18/2020, 9:14 AM

## 2019-01-10 NOTE — Progress Notes (Signed)
Physical Therapy Session Note  Patient Details  Name: Robert Brewer MRN: [id number] Date of Birth: 1946-04-07  Today's Date: 01/10/2019 PT Individual Time: 1000-1100 PT Individual Time Calculation (min): 60 min   Short Term Goals: Week 1:  PT Short Term Goal 1 (Week 1): Pt will complete all bed mobility with mod assist without hospital bed features. PT Short Term Goal 1 - Progress (Week 1): Met PT Short Term Goal 2 (Week 1): Pt will ambulate 50 ft with LRAD & mod assist. PT Short Term Goal 2 - Progress (Week 1): Met PT Short Term Goal 3 (Week 1): Pt will negotiate 8 steps with B rails & mod assist. PT Short Term Goal 3 - Progress (Week 1): Progressing toward goal PT Short Term Goal 4 (Week 1): Pt will consistently complete bed<>w/c transfers with min assist. PT Short Term Goal 4 - Progress (Week 1): Met Week 2:  PT Short Term Goal 1 (Week 2): Pt will perform bed mobility with no more than min assist WITHOUT bed features PT Short Term Goal 1 - Progress (Week 2): Met PT Short Term Goal 2 (Week 2): Pt will perform bed<>chair transfers with CGA using LRAD PT Short Term Goal 2 - Progress (Week 2): Met PT Short Term Goal 3 (Week 2): Pt will ambulate 17f using LRAD with min assist PT Short Term Goal 3 - Progress (Week 2): Met PT Short Term Goal 4 (Week 2): Pt will ascend/descend 4 steps using handrails with min assist PT Short Term Goal 4 - Progress (Week 2): Partly met  Skilled Therapeutic Interventions/Progress Updates:   Pt received sitting in w/c, agreeable to therapy session.  Pt self propelled in w/c using L hemi technique at S level with min cues for safety.  Once in therapy gym, transferred to mat to engage in NBaltimoretasks for R LE/UE WB, forced use and improved RLE proximal control.  Assisted pt into tall kneeling and performed mini squats with LUE support x 10 reps with cues for improved posture and decreased R shoulder elevation.  Progressed to lateral weight shifting R and L x 10 reps  with 5 sec holds and tactile cues at R pelvis and rib cage for adequate postural control and weight shift.  Progressed to tall kneeling to L half kneeling-while in position, had pt lift LUE x 10 reps holding for 5 secs each-tactile facilitation at R pelvis to maintain protraction.  Performed transitions x 3 reps during tasks.  Finally progressed to quadruped position with PT assisting at RUE axilla to ensure adequate shoulder girdle support, elbow to encourage extension.  Pt able to maintain hand/wrist position without support.  Performed forward/backwards weight shifting x 2 sets of 10 reps, extending hold on forward weight shift during second bout.  Ended in quadruped elevating LUE x 10 reps, tactile and verbal cues for improved R UE activation and R lateral weight shift, then elevating LLE x 10 reps with tactile and verbal cues for core activation, weight shift and improved RUE/LE activation for stability.  Pt tolerated all very well.  Pt reporting he has 1 step up into house (pavers that are stacked). Simulated this during session with use of RW ascending and descending forwards.  Cues for sequencing and technique with min A for stability and assist to lift/lower RW.  Ended session with gait back to room with RW.  Provided min A in order to provide facilitation at R pelvis during stance for R hip protraction as pt tends to rotate  whole body to the R, therefore PT also provided assist at L pelvis to prevent rotation.  Also provided cues and demo for improved L step length and softer L step.  Max cues for forward gaze, however pt has max difficulty with balance when looking forward due to proprioceptive deficits.  Also note fatigue once closer to room as R foot would drag more significantly.  Pt left in room with chair alarm set and all needs in reach.   Therapy Documentation Precautions:  Precautions Precautions: Fall Restrictions Weight Bearing Restrictions: No  Pain: Pain Assessment Pain Scale:  0-10 Pain Score: 0-No pain    Therapy/Group: Individual Therapy  Denice Bors 01/10/2019, 10:30 AM

## 2019-01-11 ENCOUNTER — Inpatient Hospital Stay (HOSPITAL_COMMUNITY): Payer: Medicare HMO | Admitting: Physical Therapy

## 2019-01-11 ENCOUNTER — Inpatient Hospital Stay (HOSPITAL_COMMUNITY): Payer: Medicare HMO | Admitting: Occupational Therapy

## 2019-01-11 LAB — GLUCOSE, CAPILLARY
Glucose-Capillary: 83 mg/dL (ref 70–99)
Glucose-Capillary: 71 mg/dL (ref 70–99)
Glucose-Capillary: 108 mg/dL — ABNORMAL HIGH (ref 70–99)
Glucose-Capillary: 144 mg/dL — ABNORMAL HIGH (ref 70–99)

## 2019-01-11 NOTE — Progress Notes (Signed)
Occupational Therapy Session Note  Patient Details  Name: Robert Brewer MRN: [id number] Date of Birth: 1946/02/24  Today's Date: 01/11/2019 OT Individual Time: 1300-1410 OT Individual Time Calculation (min): 70 min   Short Term Goals: Week 2:  OT Short Term Goal 1 (Week 2): STGs=LTGs secondary to upcoming discharge  Skilled Therapeutic Interventions/Progress Updates:    Pt greeted in w/c, requesting to shower. Strongly encouraged pt to wear his AFO for shower transfer to reduce fall risk, however pt adamant that he plans to complete these transfers without AFO or footwear at home. Pt did so during session with CGA using RW. Bathing completed while sitting with supervision, using lateral and forward leans for perihygiene per home setup. Pt able to don his bath mitt and incorporate Rt hand during bathing tasks with assist from [place]. He used one handed techniques to thoroughly wash Rt side. Advised pt to have a chair in bathroom for dressing post d/c. However pt reports bathroom at home is too small to accommodate another chair. He said he planned to get dressed from EOB, and that it was about the same distance from shower<CIR bed. Once again, pt refused to wear footwear as he will not at home. Educated pt on safety/fall risk reduction with footwear, however pt still refused education. He ambulated with CGA using device once again. When he backed up to bed, OT provided supervision assist. He lost his balance backwards and the bed caught his fall. When asked what he'll do if he falls at home, pt states "I won't fall." Educated pt to call spouse's brother if he injures himself post fall at home (I.e. hits head or is bleeding) vs getting up himself. Pt verbalized understanding, however adamant that he will be able to get himself up. Provided close supervision for dynamic standing during LB dressing. Pt with 2 LOBs backwards onto bed. Educated pt to fasten pants while supine vs standing for safety, however pt  fastened pants while standing. CGA for ambulatory transfer to w/c using device. Reiterated safety recommendations, including wearing AFO (+RW) during all bathroom transfers, using RW for all functional tasks, and having spouse's brother assist with cat care vs attempting himself. Pt is adamant that he will be able to take care of cats and mow the lawn himself. He refused safety education with told this is not safe given his current deficits. Pt was left in w/c with all needs within reach and chair alarm set.    Therapy Documentation Precautions:  Precautions Precautions: Fall Restrictions Weight Bearing Restrictions: No Vital Signs: Therapy Vitals Temp: 97.8 F (36.6 C) Temp Source: Oral Pulse Rate: 61 Resp: 18 BP: 121/74 Patient Position (if appropriate): Sitting Oxygen Therapy SpO2: 99 % O2 Device: Room Air Pain: Pt denies pain    ADL:       Therapy/Group: Individual Therapy  Zygmunt Mcglinn A Haedyn Breau 01/11/2019, 4:44 PM

## 2019-01-11 NOTE — Progress Notes (Signed)
Physical Therapy Session Note  Patient Details  Name: Robert Brewer MRN: [id number] Date of Birth: 1946/01/04  Today's Date: 01/11/2019 PT Individual Time: 0800-0900 and 1600-1700 PT Individual Time Calculation (min): 60 min and 60 min   Short Term Goals: Week 2:  PT Short Term Goal 1 (Week 2): Pt will perform bed mobility with no more than min assist WITHOUT bed features PT Short Term Goal 1 - Progress (Week 2): Met PT Short Term Goal 2 (Week 2): Pt will perform bed<>chair transfers with CGA using LRAD PT Short Term Goal 2 - Progress (Week 2): Met PT Short Term Goal 3 (Week 2): Pt will ambulate 68f using LRAD with min assist PT Short Term Goal 3 - Progress (Week 2): Met PT Short Term Goal 4 (Week 2): Pt will ascend/descend 4 steps using handrails with min assist PT Short Term Goal 4 - Progress (Week 2): Partly met Week 3:  PT Short Term Goal 1 (Week 3): STG=LTG due to ELOS  Skilled Therapeutic Interventions/Progress Updates:   Pt received sitting in WC and agreeable to PT. WC mobility x 2056fwith supervision assist from PT for safety and cues for doorway management  Gait training with RW, AFO, and close supervision assist from PT for safety x [id number]. Min cues for step length, posture and improved hip flexion to advance the RLE.    Stair management training on 6 inch steps x 8 with rest break between sets. Step to gait pattern on fist bout and step through with ascent on second bout. Supervision assist with intermittent CGA assist from PT for safety on descend with moderate cues for LE placement and improved posture.   Car transfer training with RW with close supervision assist from PT and min cues for AD management and improved sequencing of movement to improved safety.   kinetron reciprocal movement training 3 x 1 min with rest break between bouts. Moderate cues for improved reciprocal activation.       Patient returned to room and [facility]  and all needs met.     Session 2.   Pt received sitting in WC and agreeable to PT. WC mobility x [id number] distant supervision assist from PT. Dynamic balance training on wii fit penguin slide x 3. Table tilt x 3. Improved control for COM with increased repetitions. Min assist progressing to close supervision assist with moderate cues for use of ankle strategy to improve COM control; BUE support on RW throughout. Min assist required to ascend and descend 2" step onto wii fit board.    Gait training with RW x [id number]-supervision assist from PT, for safety with moderate cues for posture, and step height on the R. Improved consistency of step through gait pattern on this day compared to previous session. Patient returned to room and [facility] in reach and all needs met.       Therapy Documentation Precautions:  Precautions Precautions: Fall Restrictions Weight Bearing Restrictions: No General:   Vital Signs:  Pain: Pain Assessment Pain Scale: 0-10 Pain Score: 0-No pain Mobility:   Locomotion :    Trunk/Postural Assessment :    Balance:   Exercises:   Other Treatments:      Therapy/Group: Individual Therapy  AustiLorie Phenix/2020, 10:04 AM

## 2019-01-11 NOTE — Progress Notes (Signed)
Social Work Patient ID: Robert Brewer, male   DOB: 12/31/1945, 73 y.o.   MRN: [id number]   CSW met with pt 01-10-19 and updated him on team conference discussion.  We tried to call pt's wife, but she was not available.  Pt is concerned that wife expects him to come back home better than he is and to jump back in to doing everything he was doing before.  CSW offered support.  Pt may need extra time on CIR to reach steady, full supervision level and not just close by supervision, as pt's wife will likely not provide close supervision.  CSW talked with team and MD about extending pt's stay to 11-18-18.  CSW has also placed this request with pt's insurance company.

## 2019-01-11 NOTE — Progress Notes (Signed)
[name] PHYSICAL MEDICINE & REHABILITATION PROGRESS NOTE   Subjective/Complaints:  Able to amb 4 steps with rail Sup  Review of systems denies chest pain shortness of breath, nausea vomiting diarrhea,  Objective:   No results found. No results for input(s): WBC, HGB, HCT, PLT in the last 72 hours. No results for input(s): NA, K, CL, CO2, GLUCOSE, BUN, CREATININE, CALCIUM in the last 72 hours.  Intake/Output Summary (Last 24 hours) at 01/11/2019 0837 Last data filed at 01/11/2019 7741 Gross per 24 hour  Intake 236 ml  Output 1500 ml  Net -1264 ml     Physical Exam: Vital Signs Blood pressure 109/66, pulse (!) 44, temperature 98.3 F (36.8 C), resp. rate 19, height 5\' 11"  (1.803 m), weight 92.4 kg, SpO2 94 %.   General: No acute distress Mood and affect are appropriate Heart: Regular rate and rhythm no rubs murmurs or extra sounds Lungs: Clear to auscultation, breathing unlabored, no rales or wheezes Abdomen: Positive bowel sounds, soft nontender to palpation, nondistended Extremities: No clubbing, cyanosis, or edema Skin: No evidence of breakdown, no evidence of rash Neurologic: Cranial nerves II through XII intact, motor strength is 5/5 in LEFT  deltoid, bicep, tricep, grip, hip flexor, knee extensors, ankle dorsiflexor and plantar flexor 3- RIght biceps and finger flexors,2- sup/pron, 2- thumb ext  forearm  3- Left hip flexion and 3/5 hip /knee ext synergy, 0 at ankle  Cerebellar exam normal finger to nose to finger as well as heel to shin in left upper and lower extremities Musculoskeletal: Full range of motion in all 4 extremities. No joint swelling   Assessment/Plan: 1. 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  Physiatrist is providing close team supervision and 24 hour management of active medical problems listed below.  Physiatrist and  rehab team continue to assess barriers to discharge/monitor patient progress toward functional and medical goals  Care Tool:  Bathing  Bathing activity did not occur: Refused Body parts bathed by patient: Right arm, Chest, Abdomen, Front perineal area, Buttocks, Right upper leg, Left upper leg, Right lower leg, Left lower leg, Face   Body parts bathed by helper: Left arm     Bathing assist Assist Level: Minimal Assistance - Patient > 75%     Upper Body Dressing/Undressing Upper body dressing   What is the patient wearing?: Pull over shirt    Upper body assist Assist Level: Supervision/Verbal cueing    Lower Body Dressing/Undressing Lower body dressing      What is the patient wearing?: Pants     Lower body assist Assist for lower body dressing: Minimal Assistance - Patient > 75%     Toileting Toileting Toileting Activity did not occur (Clothing management and hygiene only): Refused  Toileting assist Assist for toileting: Moderate Assistance - Patient 50 - 74%     Transfers Chair/bed transfer  Transfers assist  Chair/bed transfer activity did not occur: Safety/medical concerns  Chair/bed transfer assist level: Contact Guard/Touching assist     Locomotion Ambulation   Ambulation assist   Ambulation activity did not occur: Safety/medical concerns  Assist level: Minimal Assistance - Patient > 75% Assistive device: Walker-rolling Max distance: 150   Walk 10 feet activity   Assist  Walk 10 feet activity did not occur: Safety/medical concerns  Assist level: Supervision/Verbal cueing Assistive device: Walker-rolling   Walk 50 feet activity   Assist Walk 50 feet with 2  turns activity did not occur: Safety/medical concerns  Assist level: Supervision/Verbal cueing Assistive device: Walker-rolling    Walk 150 feet activity   Assist Walk 150 feet activity did not occur: Safety/medical concerns  Assist level: Contact Guard/Touching assist Assistive  device: Walker-rolling    Walk 10 feet on uneven surface  activity   Assist Walk 10 feet on uneven surfaces activity did not occur: Safety/medical concerns         Wheelchair     Assist Will patient use wheelchair at discharge?: Yes Type of Wheelchair: Manual    Wheelchair assist level: Supervision/Verbal cueing Max wheelchair distance: 200    Wheelchair 50 feet with 2 turns activity    Assist        Assist Level: Supervision/Verbal cueing   Wheelchair 150 feet activity     Assist     Assist Level: Supervision/Verbal cueing    Medical Problem List and Plan: 1.Right side weakness with facial droop and dysarthriasecondary to 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 Status post TPA. Patient refused loop recorder Plan d/c 6/20  2. Antithrombotics: -DVT/anticoagulation:SCDs -antiplatelet therapy: Aspirin 81 mg daily 3. Pain Management:Tylenol as needed 4. Mood:Provide emotional support -antipsychotic agents: N/A 5. Neuropsych: This patientiscapable of making decisions on hisown behalf. 6. Skin/Wound Care:Routine skin checks 7. Fluids/Electrolytes/Nutrition:Routine in and outs with follow-up chemistries -encourage PO 8.Diabetes mellitus. Hemoglobin A1c 13.3. Novolog7units TID,Lantus insulin 30units daily.  CBG (last 3)  Recent Labs    01/10/19 1626 01/10/19 2057 01/11/19 0626  GLUCAP 135* 172* 83  Overall in good range,  -diabetic education 9. Permissive Hypertension. Monitor with increased mobility. Patient on no prescription medications prior to admission Vitals:   01/10/19 1926 01/11/19 0511  BP: 108/75 109/66  Pulse: (!) 59 (!) 44  Resp: 20 19  Temp: 98.3 F (36.8 C) 98.3 F (36.8 C)  SpO2: 95% 94%  Controlled 6/18 no need for meds, asymptomatic brady  10.Medical noncompliance. Counseling 11.  Constipation improved, >1BM per  day   LOS: 18 days A FACE TO [place] E  01/11/2019, 8:37 AM

## 2019-01-11 NOTE — Patient Care Conference (Signed)
Inpatient RehabilitationTeam Conference and Plan of Care Update Date: 01/09/2019   Time: 10:45 AM    Patient Name: Robert Brewer      Medical Record Number: [id number]  Date of Birth: 1945/12/09 Sex: Male         Room/Bed: 4W22C/4W22C-01 Payor Info: Payor: [employer] MEDICARE / Plan: [employer] MEDICARE HMO / Product Type: *No Product type* /    Admitting Diagnosis: 3. CVA 1 Team  Lt CVA; 22-24days  Admit Date/Time:  12/24/2018  6:04 PM Admission Comments: No comment available   Primary Diagnosis:  <principal problem not specified> Principal Problem: <principal problem not specified>  Patient Active Problem List   Diagnosis Date Noted  . Reactive depression   . Left pontine cerebrovascular accident ([place]) 12/24/2018  . Cerebral thrombosis with cerebral infarction ([facility]) - L paramedian pontnine and L occipital, s/p  tPA   . S/P admn tPA in diff fac w/n last 24 hr bef adm to crnt fac   . Right hemiparesis ([place])   . Dysarthria   . Dysphagia, pharyngoesophageal phase   . Essential hypertension   . Patient non-compliant, refused service   . Non compliance w medication regimen   . Hyperglycemia   . Uncontrolled type 2 diabetes mellitus with hyperglycemia ([place])   . Hyperlipidemia LDL goal <70   . Ischemic cerebrovascular accident (CVA) ([place]) 12/17/2018    Expected Discharge Date: Expected Discharge Date: (? if pt needs a few more days to reach full supervision level)  Team Members Present: Physician leading conference: Dr. Alysia Penna Social Worker Present: Alfonse Alpers, LCSW Nurse Present: Frances Maywood, RN PT Present: Barrie Folk, PT;Rosita Dechalus, PTA OT Present: Darleen Crocker, OT SLP Present: Charolett Bumpers, SLP PPS Coordinator present : Gunnar Fusi, SLP     Current Status/Progress Goal Weekly Team Focus  Medical   improving with RUE weakness LE>UE, dysarthria improving , mild constipation  maintain medical stability , Blood sugar management  NM re education for RUE  and RLE   Bowel/Bladder   continent of bowel & bladder, LBM 01/08/19  remain continent  monitor & assist as needed   Swallow/Nutrition/ Hydration             ADL's   Supervision-Min A overall, Min A bathroom transfers using RW at ambulatory level with Rt AFO  supervision overall  Rt NMR, self care retraining, balance, trunk control, functional transfers, d/c planning   Mobility   supervision bed mobility with bed features, CGA transfers, CGA to minA gait up to 16ft with RW. minA stairs, continues to demonstrate R knee instability with all functional mobility  supervision overall  balance, R NMR, RLE strengthening family ed   Communication             Safety/Cognition/ Behavioral Observations            Pain   no c/o pain on shift, has tylenol prn  pain scale <4/10  assess & treat as needed   Skin   ecchymosis in various areas, no skin break down noted  no new skin break down  while on rehab  assess q shift    Rehab Goals Patient on target to meet rehab goals: No(Pt may need more time to reach full supervision.) Rehab Goals Revised: none *See Care Plan and progress notes for long and short-term goals.     Barriers to Discharge  Current Status/Progress Possible Resolutions Date Resolved   Physician    Medical stability     Progressing toward goals  cont rehab      Nursing                  PT                    OT                  SLP                SW                Discharge Planning/Teaching Needs:  Pt plans to return to his home with his wife to provide care for him as needed.  Wife may not come for family education, so CSW has requested that therapists call wife to talk through things over the phone.   Team Discussion:  Pt is getting some strength back and his dysarthria is improving.  He has no major medical issues and Dr. Letta Pate will adjust meds for decreased pulse and blood pressure.  Pt has refused senna and long acting insulin.  He is continent of bowel  and bladder.  Pt is supervision/min A overall with min guard/min A for higher level balance tasks.  Pt needs min guard A for toilet transfer.  Pt showering with bath mitt and getting some return in the arm, but is not very functional.  Pt and wife struggling somewhat with expectations of pt's recovery.  Pt is making progress with PT and is CGA for gait and fatigues easily.  He has less coordination.  He is min A with single rail on stairs for home entry.  Pt has right knee instability and is gaining awareness.  Revisions to Treatment Plan:  none    Continued Need for Acute Rehabilitation Level of Care: The patient requires daily medical management by a physician with specialized training in physical medicine and rehabilitation for the following conditions: Daily direction of a multidisciplinary physical rehabilitation program to ensure safe treatment while eliciting the highest outcome that is of practical value to the patient.: Yes Daily medical management of patient stability for increased activity during participation in an intensive rehabilitation regime.: Yes Daily analysis of laboratory values and/or radiology reports with any subsequent need for medication adjustment of medical intervention for : Neurological problems;Diabetes problems   I attest that I was present, lead the team conference, and concur with the assessment and plan of the team.Team conference was held via web/ teleconference due to [place] - 19.    Nadir Vasques, Silvestre Mesi 01/11/2019, 12:53 PM

## 2019-01-12 ENCOUNTER — Inpatient Hospital Stay (HOSPITAL_COMMUNITY): Payer: Medicare HMO | Admitting: Physical Therapy

## 2019-01-12 LAB — GLUCOSE, CAPILLARY
Glucose-Capillary: 85 mg/dL (ref 70–99)
Glucose-Capillary: 71 mg/dL (ref 70–99)
Glucose-Capillary: 149 mg/dL — ABNORMAL HIGH (ref 70–99)
Glucose-Capillary: 196 mg/dL — ABNORMAL HIGH (ref 70–99)

## 2019-01-12 NOTE — Progress Notes (Signed)
[facility] PHYSICAL MEDICINE & REHABILITATION PROGRESS NOTE   Subjective/Complaints:  No issues overnight, we discussed the cat situation at home he has 30 cats.  He states that they tend not to get underfoot but we discussed the concerns about this.  His wife is only able to provide supervision and he is currently requiring some min guard assistance  Review of systems denies chest pain shortness of breath, nausea vomiting diarrhea,  Objective:   No results found. No results for input(s): WBC, HGB, HCT, PLT in the last 72 hours. No results for input(s): NA, K, CL, CO2, GLUCOSE, BUN, CREATININE, CALCIUM in the last 72 hours.  Intake/Output Summary (Last 24 hours) at 01/12/2019 1144 Last data filed at 01/12/2019 0700 Gross per 24 hour  Intake 360 ml  Output 1000 ml  Net -640 ml     Physical Exam: Vital Signs Blood pressure 108/66, pulse (!) 44, temperature 98 F (36.7 C), resp. rate 18, height 5\' 11"  (1.803 m), weight 90.1 kg, SpO2 95 %.   General: No acute distress Mood and affect are appropriate Heart: Regular rate and rhythm no rubs murmurs or extra sounds Lungs: Clear to auscultation, breathing unlabored, no rales or wheezes Abdomen: Positive bowel sounds, soft nontender to palpation, nondistended Extremities: No clubbing, cyanosis, or edema Skin: No evidence of breakdown, no evidence of rash Neurologic: Cranial nerves II through XII intact, motor strength is 5/5 in LEFT  deltoid, bicep, tricep, grip, hip flexor, knee extensors, ankle dorsiflexor and plantar flexor 3- RIght biceps and finger flexors,2- sup/pron, 2- thumb ext  forearm  3- Left hip flexion and 3/5 hip /knee ext synergy, 0 at ankle  Cerebellar exam normal finger to nose to finger as well as heel to shin in left upper and lower extremities Musculoskeletal: Full range of motion in all 4 extremities. No joint swelling   Assessment/Plan: 1. 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  Physiatrist is providing close team supervision and 24 hour management of active medical problems listed below.  Physiatrist and rehab team continue to assess barriers to discharge/monitor patient progress toward functional and medical goals  Care Tool:  Bathing  Bathing activity did not occur: Refused Body parts bathed by patient: Right arm, Chest, Abdomen, Front perineal area, Buttocks, Right upper leg, Left upper leg, Right lower leg, Left lower leg, Face, Left arm   Body parts bathed by helper: Left arm     Bathing assist Assist Level: Supervision/Verbal cueing(sitting on shower chair, lateral leans for perihygiene)     Upper Body Dressing/Undressing Upper body dressing   What is the patient wearing?: Pull over shirt    Upper body assist Assist Level: Set up assist    Lower Body Dressing/Undressing Lower body dressing      What is the patient wearing?: Pants     Lower body assist Assist for lower body dressing: Supervision/Verbal cueing     Toileting Toileting Toileting Activity did not occur (Clothing management and hygiene only): Refused  Toileting assist Assist for toileting: Moderate Assistance - Patient 50 - 74%     Transfers Chair/bed transfer  Transfers assist  Chair/bed transfer activity did not occur: Safety/medical concerns  Chair/bed transfer assist level: Supervision/Verbal cueing     Locomotion Ambulation   Ambulation assist   Ambulation activity did not occur: Safety/medical concerns  Assist level: Supervision/Verbal cueing Assistive device: Walker-rolling Max distance: 160   Walk  10 feet activity   Assist  Walk 10 feet activity did not occur: Safety/medical concerns  Assist level: Supervision/Verbal cueing Assistive device: Orthosis, Walker-rolling   Walk 50 feet activity   Assist Walk 50 feet with 2 turns activity did not  occur: Safety/medical concerns  Assist level: Supervision/Verbal cueing Assistive device: Walker-rolling, Orthosis    Walk 150 feet activity   Assist Walk 150 feet activity did not occur: Safety/medical concerns  Assist level: Supervision/Verbal cueing Assistive device: Walker-rolling, Orthosis    Walk 10 feet on uneven surface  activity   Assist Walk 10 feet on uneven surfaces activity did not occur: Safety/medical concerns   Assist level: Minimal Assistance - Patient > 75% Assistive device: Aeronautical engineer Will patient use wheelchair at discharge?: Yes Type of Wheelchair: Manual    Wheelchair assist level: Independent Max wheelchair distance: 200    Wheelchair 50 feet with 2 turns activity    Assist        Assist Level: Independent   Wheelchair 150 feet activity     Assist     Assist Level: Independent    Medical Problem List and Plan: 1.Right side weakness with facial droop and dysarthriasecondary to 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 Status post TPA. Patient refused loop recorder Will try to extend stay to improve functional mobility given his home environment  2. Antithrombotics: -DVT/anticoagulation:SCDs -antiplatelet therapy: Aspirin 81 mg daily 3. Pain Management:Tylenol as needed 4. Mood:Provide emotional support -antipsychotic agents: N/A 5. Neuropsych: This patientiscapable of making decisions on hisown behalf. 6. Skin/Wound Care:Routine skin checks 7. Fluids/Electrolytes/Nutrition:Routine in and outs with follow-up chemistries -encourage PO 8.Diabetes mellitus. Hemoglobin A1c 13.3.Lantus insulin 30units daily. Meal coverage has been discontinued CBG (last 3)  Recent Labs    01/11/19 1701 01/11/19 2137 01/12/19 0630  GLUCAP 108* 144* 85  Overall in good range, no change in  medications -diabetic education 9. Permissive Hypertension. Monitor with increased mobility. Patient on no prescription medications prior to admission Vitals:   01/11/19 1927 01/12/19 0409  BP: 123/75 108/66  Pulse: 64 (!) 44  Resp: 20 18  Temp: 98 F (36.7 C) 98 F (36.7 C)  SpO2: 96% 95%  Controlled 6/18 no need for meds, asymptomatic brady  10.Medical noncompliance. Counseling 11.  Constipation improved, >1BM per day   LOS: 19 days A FACE TO [place] E  01/12/2019, 11:44 AM

## 2019-01-12 NOTE — Progress Notes (Signed)
Physical Therapy Session Note  Patient Details  Name: Robert Brewer MRN: [id number] Date of Birth: 10/11/1945  Today's Date: 01/12/2019 PT Individual Time: 1010-1105 PT Individual Time Calculation (min): 55 min   Short Term Goals: Week 3:  PT Short Term Goal 1 (Week 3): STG=LTG due to ELOS  Skilled Therapeutic Interventions/Progress Updates:   Pt received sitting in WC and agreeable to PT. WC mobility through rehab unit with hemi technique 2 x 200ft; no cues for assist required from PT throughout WC mobility.   Gait training with RW x 160ft and supervision assist for safety. 2 near LOB due to poor foot clearance on the R, but no additional assist required for pt to correct.   Dynamic standing balance while engage in wii sports x 10 minutes with R UE supported on RE. Supervision assist from PT with min cues for equal WB in BLE. No LOB noted throughout.   Step negotiation training up/down 2 curb steps with RW. Min assist on the first bout and CGA on second bout. Moderate-min multimodal cues for step to gait pattern and improved AD management.  Pt continues to reference going up/down steps at house to feed cats in basement with only 1 rail. Repeatedly educated and instructed not to attempt to go up/down steps at house without 2nd rail or improved Neuromotor control in the RUE/RLE.   PT instructed pt in RUE Neuromotor re-ed. Gravity eliminated shoulder horizontal adduction/adductoin 2 x 40 with moderate multimodal cues for decreased accessory movements through upper trap.   Patient returned to room and left sitting in WC with call bell in reach and all needs met.        Therapy Documentation Precautions:  Precautions Precautions: Fall Restrictions Weight Bearing Restrictions: No Pain:   denies    Therapy/Group: Individual Therapy  Austin E Tucker 01/12/2019, 11:05 AM  

## 2019-01-13 ENCOUNTER — Inpatient Hospital Stay (HOSPITAL_COMMUNITY): Payer: Medicare HMO | Admitting: Occupational Therapy

## 2019-01-13 ENCOUNTER — Inpatient Hospital Stay (HOSPITAL_COMMUNITY): Payer: Medicare HMO

## 2019-01-13 LAB — GLUCOSE, CAPILLARY
Glucose-Capillary: 98 mg/dL (ref 70–99)
Glucose-Capillary: 83 mg/dL (ref 70–99)
Glucose-Capillary: 109 mg/dL — ABNORMAL HIGH (ref 70–99)
Glucose-Capillary: 145 mg/dL — ABNORMAL HIGH (ref 70–99)

## 2019-01-13 NOTE — Progress Notes (Signed)
Occupational Therapy Weekly Progress Note  Patient Details  Name: Robert Brewer MRN: [id number] Date of Birth: 04-26-1946  Beginning of progress report period: 01/02/19 End of progress report period: 01/13/19  Today's Date: 01/13/2019 OT Individual Time: 6720-9470 OT Individual Time Calculation (min): 56 min   No STGs set during this report period due to ELOS. Pt will have supervision vs close supervision assist from spouse at home, therefore LOS has been extended. At present, pt requires CGA-Min A for ambulatory bathroom transfers using device. Extensive safety education has been provided regarding ADL/IADL participation at home. Pt continues to refuse this education, adamant that he will continue engaging in IADL tasks that OT recommends to delegate to family members for safety. He is also refusing to wear his AFO for functional bathroom transfers at home, though OT strongly advises against this due to fall risk. Pt verbalizes understanding when given safety education, but politely disagrees. Prior to d/c, OT will continue providing safety education to increase pts insight into his functional deficits.     Patient continues to demonstrate the following deficits: muscle weakness and muscle paralysis, decreased cardiorespiratoy endurance, impaired timing and sequencing, abnormal tone, unbalanced muscle activation and decreased coordination, decreased safety awareness and decreased standing balance, decreased postural control, hemiplegia and decreased balance strategies and therefore will continue to benefit from skilled OT intervention to enhance overall performance with BADL.  Patient progressing toward long term goals..  Continue plan of care.  OT Short Term Goals Week 2:  OT Short Term Goal 1 (Week 2): STGs=LTGs secondary to upcoming discharge OT Short Term Goal 1 - Progress (Week 2): Progressing toward goal Week 3:  OT Short Term Goal 1 (Week 3): STGs=LTGs due to ELOS  Skilled Therapeutic  Interventions/Progress Updates:    Pt greeted in w/c with ADL needs met. Requesting to work on his Rt arm. He self propelled w/c using hemi technique to dayroom, and for remainder of session we focused on R UE NMR. Started with using forearm arm skate. He completed forward/backward glides, circles (moving into external rotation), and IR/ER to target. Provided pt with mirror to decrease compensatory strategies of using trunk and pushing through Lt side. Tactile cues to minimize Rt shoulder hike. Pt required Mod A for controlling direction of generated force with Rt. After, he completed 3 minutes each of forward/backward rotations with Rt arm strapped to small arm bike. Vcs for decreasing compensation with Lt. Pt wanted to attempt pedaling without arm strapped, and able to make 1 full rotation before his Rt hand slipped off. 10 reps 2 sets of bringing unweighted cup to mouth with R UE. OT provided assist for keeping Rt hand supinated during, but pt able to flex elbow enough against gravity to reach chin. After 1st set, had pt provide assist for Rt supination in prep for self NMR at home. Continued education regarding joint protection as he tends to lift R UE by the digits still. While standing, R UE weightbearing on table while pt lifted L LE off of floor and hovered foot above targets. Also had pt lift and lower L LE as well. Min-Mod A to support Rt LE with noted knee hyperextension. Able to hover Lt foot while maintaining WB through R LE for 1 minute 30 seconds at most. Afterwards pt self propelled back to room and was left with all needs within reach and chair alarm set.   Pt continues to be adamant that he is going to mow the lawn when he gets home.  OT continued education about why this is unsafe given his current deficits. Pt verbalized understanding, however states he thinks he will be fine mowing the lawn still.   Therapy Documentation Precautions:  Precautions Precautions: Fall Restrictions Weight  Bearing Restrictions: No Vital Signs: Therapy Vitals Temp: 98.4 F (36.9 C) Pulse Rate: (!) 44 Resp: 16 BP: 116/67 Patient Position (if appropriate): Lying Oxygen Therapy SpO2: 96 % O2 Device: Room Air Pain: No c/o pain during tx   ADL:       Therapy/Group: Individual Therapy  Jaleyah Longhi A Shirle Provencal 01/13/2019, 7:14 AM

## 2019-01-13 NOTE — Progress Notes (Signed)
[place] PHYSICAL MEDICINE & REHABILITATION PROGRESS NOTE   Subjective/Complaints:  Pt able to supinate better   Review of systems denies chest pain shortness of breath, nausea vomiting diarrhea,  Objective:   No results found. No results for input(s): WBC, HGB, HCT, PLT in the last 72 hours. No results for input(s): NA, K, CL, CO2, GLUCOSE, BUN, CREATININE, CALCIUM in the last 72 hours.  Intake/Output Summary (Last 24 hours) at 01/13/2019 0900 Last data filed at 01/13/2019 0800 Gross per 24 hour  Intake 580 ml  Output 1250 ml  Net -670 ml     Physical Exam: Vital Signs Blood pressure 116/67, pulse (!) 44, temperature 98.4 F (36.9 C), resp. rate 16, height 5\' 11"  (1.803 m), weight 92.6 kg, SpO2 96 %.   General: No acute distress Mood and affect are appropriate Heart: Regular rate and rhythm no rubs murmurs or extra sounds Lungs: Clear to auscultation, breathing unlabored, no rales or wheezes Abdomen: Positive bowel sounds, soft nontender to palpation, nondistended Extremities: No clubbing, cyanosis, or edema Skin: No evidence of breakdown, no evidence of rash Neurologic: Cranial nerves II through XII intact, motor strength is 5/5 in LEFT  deltoid, bicep, tricep, grip, hip flexor, knee extensors, ankle dorsiflexor and plantar flexor 3- RIght biceps and finger flexors,3-  sup/pron, 2- thumb ext  forearm  3- Left hip flexion and 3/5 hip /knee ext synergy, 0 at ankle  Cerebellar exam normal finger to nose to finger as well as heel to shin in left upper and lower extremities Musculoskeletal: Full range of motion in all 4 extremities. No joint swelling   Assessment/Plan: 1. 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  Physiatrist is providing close team supervision and 24 hour management of active medical problems listed below.  Physiatrist and rehab  team continue to assess barriers to discharge/monitor patient progress toward functional and medical goals  Care Tool:  Bathing  Bathing activity did not occur: Refused Body parts bathed by patient: Right arm, Chest, Abdomen, Front perineal area, Buttocks, Right upper leg, Left upper leg, Right lower leg, Left lower leg, Face, Left arm   Body parts bathed by helper: Left arm     Bathing assist Assist Level: Supervision/Verbal cueing(sitting on shower chair, lateral leans for perihygiene)     Upper Body Dressing/Undressing Upper body dressing   What is the patient wearing?: Pull over shirt    Upper body assist Assist Level: Set up assist    Lower Body Dressing/Undressing Lower body dressing      What is the patient wearing?: Pants     Lower body assist Assist for lower body dressing: Supervision/Verbal cueing     Toileting Toileting Toileting Activity did not occur (Clothing management and hygiene only): Refused  Toileting assist Assist for toileting: Moderate Assistance - Patient 50 - 74%     Transfers Chair/bed transfer  Transfers assist  Chair/bed transfer activity did not occur: Safety/medical concerns  Chair/bed transfer assist level: Supervision/Verbal cueing     Locomotion Ambulation   Ambulation assist   Ambulation activity did not occur: Safety/medical concerns  Assist level: Supervision/Verbal cueing Assistive device: Walker-rolling Max distance: 160   Walk 10 feet activity   Assist  Walk 10 feet activity did not occur: Safety/medical concerns  Assist level: Supervision/Verbal cueing Assistive device: Orthosis, Walker-rolling   Walk 50 feet activity   Assist Walk 50 feet with 2 turns activity did  not occur: Safety/medical concerns  Assist level: Supervision/Verbal cueing Assistive device: Walker-rolling, Orthosis    Walk 150 feet activity   Assist Walk 150 feet activity did not occur: Safety/medical concerns  Assist level:  Supervision/Verbal cueing Assistive device: Walker-rolling, Orthosis    Walk 10 feet on uneven surface  activity   Assist Walk 10 feet on uneven surfaces activity did not occur: Safety/medical concerns   Assist level: Minimal Assistance - Patient > 75% Assistive device: Aeronautical engineer Will patient use wheelchair at discharge?: Yes Type of Wheelchair: Manual    Wheelchair assist level: Independent Max wheelchair distance: 200    Wheelchair 50 feet with 2 turns activity    Assist        Assist Level: Independent   Wheelchair 150 feet activity     Assist     Assist Level: Independent    Medical Problem List and Plan: 1.Right side weakness with facial droop and dysarthriasecondary to 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 Status post TPA. Patient refused loop recorder Will try to extend stay to improve functional mobility given his home environment  2. Antithrombotics: -DVT/anticoagulation:SCDs -antiplatelet therapy: Aspirin 81 mg daily 3. Pain Management:Tylenol as needed 4. Mood:Provide emotional support -antipsychotic agents: N/A 5. Neuropsych: This patientiscapable of making decisions on hisown behalf. 6. Skin/Wound Care:Routine skin checks 7. Fluids/Electrolytes/Nutrition:Routine in and outs with follow-up chemistries -encourage PO 8.Diabetes mellitus. Hemoglobin A1c 13.3.Lantus insulin 30units daily. Meal coverage has been discontinued CBG (last 3)  Recent Labs    01/12/19 1712 01/12/19 2140 01/13/19 0617  GLUCAP 149* 196* 98  Overall in good range, no change in medications -diabetic education 9. Permissive Hypertension. Monitor with increased mobility. Patient on no prescription medications prior to admission Vitals:   01/12/19 1945 01/13/19 0504  BP: 122/69 116/67  Pulse: (!) 52 (!) 44  Resp: 18 16   Temp: 97.9 F (36.6 C) 98.4 F (36.9 C)  SpO2: 95% 96%  Controlled 6/21 no need for meds, asymptomatic brady  10.Medical noncompliance. Counseling 11.  Constipation improved, >1BM per day   LOS: 20 days A FACE TO FACE EVALUATION WAS PERFORMED  Robert Brewer 01/13/2019, 9:00 AM

## 2019-01-13 NOTE — Progress Notes (Signed)
Physical Therapy Session Note  Patient Details  Name: Robert Brewer MRN: [id number] Date of Birth: Feb 26, 1946  Today's Date: 01/13/2019 PT Individual Time: 0802-0900 PT Individual Time Calculation (min): 58 min   Short Term Goals: Week 3:  PT Short Term Goal 1 (Week 3): STG=LTG due to ELOS  Skilled Therapeutic Interventions/Progress Updates:    Pt seated in w/c upon PT arrival, agreeable to therapy tx and denies pain. Pt propelled w/c to the gym with supervision using L hemi technique. Pt ambulated x 100 ft this session with RW, R AFO and CGA, cues for increased L step length. Pt transferred to mat with CGA. Seated edge of mat doffed AFO, performed R LE seated hamstring curls against manual resistance for neuro re-ed, 2 x 10. Pt worked on R knee control to perform 2 x 10 mini squats without UE support and min assist, mirror for visual feedback of symmetric LE weightbearing, manual facilitation for increased R lateral weightshift. Pt worked on R LE weightbearing and stance control without AD to perform 2 x 10 forward/backwards steps in place with L LE and x 10 sidesteps in place with L LE. Pt transferred to tall kneeling on the mat with min assist to get into position and UE support on bench. Once in tall kneeling worked on postural control and symmetric LE weightbearing while therapist applied manual perturbations. Pt transferred to modified quadruped on elbows using the bench, performed scapular push ups in this position x 10 with cues for techniques. While in tall kneeling pt performed x 10 R scapular retraction for neuro re-ed with cues for techniques. Pt transferred to sitting with min assist, pt donned R AFO with supervision. In standing pt performed R LE hamstring curls active assisted for neuro re-ed x 8 and x 5 with cues for techniques and eccentric control. Pt ambulated x 80 ft with RW and CGA, verbal cues for increased L step length and tactile cues for timing of hamstring activation during  swing. Pt worked on forwards/backwards ambulation with RW 2 x 10 ft in each direction with min assist, emphasis on hamstring and hip extensor activation with R backwards stepping, verbal cues for techniques. Pt transferred to w/c with supervision and propelled back to room with supervision. Pt left seated in w/c with needs in reach and chair alarm set.   Therapy Documentation Precautions:  Precautions Precautions: Fall Restrictions Weight Bearing Restrictions: No    Therapy/Group: Individual Therapy  Netta Corrigan, PT, DPT 01/13/2019, 7:52 AM

## 2019-01-14 ENCOUNTER — Inpatient Hospital Stay (HOSPITAL_COMMUNITY): Payer: Medicare HMO | Admitting: Physical Therapy

## 2019-01-14 ENCOUNTER — Inpatient Hospital Stay (HOSPITAL_COMMUNITY): Payer: Medicare HMO

## 2019-01-14 ENCOUNTER — Inpatient Hospital Stay (HOSPITAL_COMMUNITY): Payer: Medicare HMO | Admitting: Occupational Therapy

## 2019-01-14 LAB — GLUCOSE, CAPILLARY
Glucose-Capillary: 91 mg/dL (ref 70–99)
Glucose-Capillary: 87 mg/dL (ref 70–99)
Glucose-Capillary: 134 mg/dL — ABNORMAL HIGH (ref 70–99)
Glucose-Capillary: 122 mg/dL — ABNORMAL HIGH (ref 70–99)

## 2019-01-14 NOTE — Progress Notes (Signed)
Physical Therapy Session Note  Patient Details  Name: BOYSIE BONEBRAKE MRN: [id number] Date of Birth: 1945-11-10  Today's Date: 01/14/2019 PT Individual Time: 1104-1200 PT Individual Time Calculation (min): 56 min   Short Term Goals: Week 3:  PT Short Term Goal 1 (Week 3): STG=LTG due to ELOS  Skilled Therapeutic Interventions/Progress Updates:    w/c propulsion with hemi technique at mod I level for functional mobility training and general strengthening/endurance. Pt managing legrests and brakes independently.   Gait training with RW and R hand orthosis at CGA level including turning (close Supervision if no obstacle negotiation) with cues for heel strike, posture, widening BOS, and step length to improve gait pattern.   NMR to address RLE coordination and strength and overall balance training during obstacle course and uneven surface gait including over mat surface, mat with items underneath to increase difficulty (requiring min assist) to simulate home environment (gravel), toe tap to cone and then stepping over cone (addressed on both R and LLE) during gait trial, retro gait and lateral sidestepping through cones. Pt ranged from Naplate to min assist for these dynamic activities due to mild LOB or decreased foot clearance on R. Cues for safer positioning of RW during turning. Stair negotiation for home access as well as functional strengthening with single rail and CGA - intermittent cues for sequence.   Performed TUG for fall risk assessment indicating high fall risk. Normal TUG (with RW and R AFO) average of 3 trials = 55 sec   Education provided on stroke recovery and continued therapy/movement interventions. Pt positive about what he has regained so far and able to demonstrate improved active movement in RUE.  NMR for reciprocal movement pattern retraining I BLE during w/c propulsion back to room x 150' going forwards and backwards to target muscle groups and focus on coordination and timing  of RLE activation.   Therapy Documentation Precautions:  Precautions Precautions: Fall Restrictions Weight Bearing Restrictions: No  Pain: Denies pain.   Therapy/Group: Individual Therapy  Canary Brim Ivory Broad, PT, DPT, CBIS  01/14/2019, 12:16 PM

## 2019-01-14 NOTE — Progress Notes (Signed)
Occupational Therapy Session Note  Patient Details  Name: Robert Brewer MRN: [id number] Date of Birth: May 10, 1946  Today's Date: 01/14/2019 OT Individual Time: 1300-1329 OT Individual Time Calculation (min): 29 min   Short Term Goals: Week 3:  OT Short Term Goal 1 (Week 3): STGs=LTGs due to ELOS  Skilled Therapeutic Interventions/Progress Updates:    Pt greeted in w/c with no c/o pain. ADL needs met. Wanting to work on strengthening of Rt side. He completed squats at the sink, 15 reps 2 sets, holding squatted position for 2 seconds. Manual facilitation provided for increasing weightbearing through R UE/LE. Sit<stands completed without UE support to work on trunk and LE strengthening. Pt needed Min-Mod A for power ups, and Min A for control while lowering as he held onto R UE. Vcs provided for scooting towards edge of chair, rocking, and breathing out with exertion. Discussed OT recommendation of 3:1 for nighttime toileting at home, with pt refusing to use it. He stated:"I'm going to only use the toilet." Pt left in w/c with all needs within reach and chair alarm set.   Therapy Documentation Precautions:  Precautions Precautions: Fall Restrictions Weight Bearing Restrictions: No ADL:  :     Therapy/Group: Individual Therapy  Rachard Isidro A Kiko Ripp 01/14/2019, 3:52 PM

## 2019-01-14 NOTE — Progress Notes (Signed)
Physical Therapy Session Note  Patient Details  Name: Robert Brewer MRN: [id number] Date of Birth: 05-Apr-1946  Today's Date: 01/14/2019 PT Individual Time: 7253-6644 PT Individual Time Calculation (min): 40 min   Short Term Goals: Week 3:  PT Short Term Goal 1 (Week 3): STG=LTG due to ELOS  Skilled Therapeutic Interventions/Progress Updates:  Pt received in w/c & agreeable to tx. Pt denies c/o pain. Pt propels w/c around unit with mod I & L hemi technique. Pt transfers w/c>mat table via stand pivot with min assist without AD with difficulty advancing RLE; therapist educates pt not to transfer in this manner at home as his wife is unable to assist him & he currently needs help. From EOM pt performs sit<>stands with min assist with LLE elevated on 6" step with focus on RLE strengthening & NMR, and eccentric control with stand>sit; pt completes 2 sets x 5 reps with rest break in between 2/2 fatigue. Pt transfers sit<>supine with supervision and from prone to quadruped requires min assist to assist at R hips & stabilize R elbow. Pt requires multimodal cuing & assistance to achieve quadruped position then engage alternating lifting LUE/LLE to focus on core strengthening & control and RUE/RLE weight bearing for strengthening & NMR. Pt transfers supine>sit with supervision and squat pivot mat>w/c with min assist with instructional cuing for technique. At end of session pt left sitting in w/c with chair alarm donned & call bell in reach.  Therapy Documentation Precautions:  Precautions Precautions: Fall Restrictions Weight Bearing Restrictions: No      Therapy/Group: Individual Therapy  [name] 01/14/2019, 10:31 AM

## 2019-01-14 NOTE — Progress Notes (Signed)
Patient reports having CBG machine and BP machine at home. Unsure if patient will be able to check CBG with RUE weakness. Continent B & B, using urinal with assist to empty only. Refused scheduled protonix. Robert Brewer A

## 2019-01-14 NOTE — Progress Notes (Signed)
Physical Therapy Session Note  Patient Details  Name: Robert Brewer MRN: [id number] Date of Birth: 1945-09-04  Today's Date: 01/14/2019 PT Individual Time: 1430-1515 PT Individual Time Calculation (min): 45 min   Short Term Goals: Week 3:  PT Short Term Goal 1 (Week 3): STG=LTG due to ELOS  Skilled Therapeutic Interventions/Progress Updates:    Pt seated in w/c upon PT arrival, agreeable to therapy tx and denies pain. Pt propelled w/c to the gym with supervision using L hemi technique. Pt ambulated x 120 ft this session with RW and min assist, decreased R lateral weightshift noted with cues for correction. Pt transferred to the mat. Pt worked on dynamic standing balance and R lateral weightshifting to perform reaching task on R side while tossing horseshoes, x 1 with feet on level surface and x 1 trial with 2 inch step under L LE for increased R lateral weightshift. Pt standing without AD worked on Air cabin crew for visual feedback and theraband used for resistance, therapist pulling pt with theraband to the L with cues to "push in to the band, don't let me pull you" in order to facilitate increased R lateral weightshift. Pt then worked on timing of R lateral weightshift and L stepping in place with tactile/verbal cues to push into therapists hand on R hip to facilitate R lateral shift before each step, min assist x 2 trials stepping in place and x 1 trial for toe taps on 2 inch step. Pt transferred to w/c and propelled back to room with supervision. Pt left in w/c with needs in reach and chair alarm set.   Therapy Documentation Precautions:  Precautions Precautions: Fall Restrictions Weight Bearing Restrictions: No    Therapy/Group: Individual Therapy  Netta Corrigan, PT, DPT 01/14/2019, 7:59 AM

## 2019-01-14 NOTE — Progress Notes (Signed)
PHYSICAL MEDICINE & REHABILITATION PROGRESS NOTE   Subjective/Complaints:  No new complaints. Denies pain. Right foot still weak.   ROS: Patient denies fever, rash, sore throat, blurred vision, nausea, vomiting, diarrhea, cough, shortness of breath or chest pain, joint or back pain, headache, or mood change.    Objective:   No results found. No results for input(s): WBC, HGB, HCT, PLT in the last 72 hours. No results for input(s): NA, K, CL, CO2, GLUCOSE, BUN, CREATININE, CALCIUM in the last 72 hours.  Intake/Output Summary (Last 24 hours) at 01/14/2019 1055 Last data filed at 01/14/2019 1049 Gross per 24 hour  Intake 720 ml  Output 1250 ml  Net -530 ml     Physical Exam: Vital Signs Blood pressure 115/65, pulse (!) 52, temperature 98.7 F (37.1 C), temperature source Oral, resp. rate 18, height 5\' 11"  (1.803 m), weight 92.7 kg, SpO2 96 %.   Constitutional: No distress . Vital signs reviewed. HEENT: EOMI, oral membranes moist Neck: supple Cardiovascular: RRR without murmur. No JVD    Respiratory: CTA Bilaterally without wheezes or rales. Normal effort    GI: BS +, non-tender, non-distended  Extremities: No clubbing, cyanosis, or edema Skin: No evidence of breakdown, no evidence of rash Neurologic: good insight and awareness. Cranial nerves II through XII intact, motor strength is 5/5 in LEFT  deltoid, bicep, tricep, grip, hip flexor, knee extensors, ankle dorsiflexor and plantar flexor 3- RIght biceps and finger flexors,3-  sup/pron, 2- thumb ext  forearm  3- Left hip flexion and 3/5 hip /knee ext synergy, 0 at ankle--no real change  Cerebellar exam normal finger to nose to finger as well as heel to shin in left upper and lower extremities Musculoskeletal: Full range of motion in all 4 extremities. No edema Psych: pleasant and up beat   Assessment/Plan: 1. 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 which require 3+ hours per day of  interdisciplinary therapy in a comprehensive inpatient rehab setting.  Physiatrist is providing close team supervision and 24 hour management of active medical problems listed below.  Physiatrist and rehab team continue to assess barriers to discharge/monitor patient progress toward functional and medical goals  Care Tool:  Bathing  Bathing activity did not occur: Refused Body parts bathed by patient: Right arm, Chest, Abdomen, Front perineal area, Buttocks, Right upper leg, Left upper leg, Right lower leg, Left lower leg, Face, Left arm   Body parts bathed by helper: Left arm     Bathing assist Assist Level: Supervision/Verbal cueing(sitting on shower chair, lateral leans for perihygiene)     Upper Body Dressing/Undressing Upper body dressing   What is the patient wearing?: Pull over shirt    Upper body assist Assist Level: Set up assist    Lower Body Dressing/Undressing Lower body dressing      What is the patient wearing?: Pants     Lower body assist Assist for lower body dressing: Supervision/Verbal cueing     Toileting Toileting Toileting Activity did not occur (Clothing management and hygiene only): Refused  Toileting assist Assist for toileting: Moderate Assistance - Patient 50 - 74%     Transfers Chair/bed transfer  Transfers assist  Chair/bed transfer activity did not occur: Safety/medical concerns  Chair/bed transfer assist level: Minimal Assistance - Patient > 75%     Locomotion Ambulation   Ambulation assist   Ambulation activity did not occur: Safety/medical concerns  Assist level: Contact Guard/Touching assist Assistive device: Walker-rolling Max distance: 100 ft   Walk  10 feet activity   Assist  Walk 10 feet activity did not occur: Safety/medical concerns  Assist level: Contact Guard/Touching assist Assistive device: Walker-rolling, Orthosis   Walk 50 feet activity   Assist Walk 50 feet with 2 turns activity did not occur:  Safety/medical concerns  Assist level: Contact Guard/Touching assist Assistive device: Walker-rolling, Orthosis    Walk 150 feet activity   Assist Walk 150 feet activity did not occur: Safety/medical concerns  Assist level: Supervision/Verbal cueing Assistive device: Walker-rolling, Orthosis    Walk 10 feet on uneven surface  activity   Assist Walk 10 feet on uneven surfaces activity did not occur: Safety/medical concerns   Assist level: Minimal Assistance - Patient > 75% Assistive device: Aeronautical engineer Will patient use wheelchair at discharge?: Yes Type of Wheelchair: Manual    Wheelchair assist level: Independent Max wheelchair distance: 150 ft    Wheelchair 50 feet with 2 turns activity    Assist        Assist Level: Independent   Wheelchair 150 feet activity     Assist     Assist Level: Independent    Medical Problem List and Plan: 1.Right side weakness with facial droop and dysarthriasecondary to 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 Status post TPA. Patient refused loop recorder  ELOS now 6/26 2. Antithrombotics: -DVT/anticoagulation:SCDs -antiplatelet therapy: Aspirin 81 mg daily 3. Pain Management:Tylenol as needed 4. Mood:Provide emotional support -antipsychotic agents: N/A 5. Neuropsych: This patientiscapable of making decisions on hisown behalf. 6. Skin/Wound Care:Routine skin checks 7. Fluids/Electrolytes/Nutrition:Routine in and outs with follow-up chemistries -encourage PO 8.Diabetes mellitus. Hemoglobin A1c 13.3.Lantus insulin 30units daily. Meal coverage has been discontinued CBG (last 3)  Recent Labs    01/13/19 1644 01/13/19 2101 01/14/19 0600  GLUCAP 109* 145* 91  fair control today  -diabetic education 9. Permissive Hypertension. Monitor with increased mobility. Patient on no prescription  medications prior to admission Vitals:   01/13/19 1925 01/14/19 0602  BP: 126/72 115/65  Pulse: 61 (!) 52  Resp: 17 18  Temp: 98.6 F (37 C) 98.7 F (37.1 C)  SpO2: 98% 96%  Controlled 6/22    10.Medical noncompliance. Counseling 11.  Constipation improved,   Moved bowels on 6/20  LOS: 21 days A FACE TO FACE EVALUATION WAS PERFORMED  Robert Brewer 01/14/2019, 10:55 AM

## 2019-01-15 ENCOUNTER — Inpatient Hospital Stay (HOSPITAL_COMMUNITY): Payer: Medicare HMO | Admitting: Physical Therapy

## 2019-01-15 ENCOUNTER — Inpatient Hospital Stay (HOSPITAL_COMMUNITY): Payer: Medicare HMO | Admitting: Occupational Therapy

## 2019-01-15 LAB — GLUCOSE, CAPILLARY
Glucose-Capillary: 71 mg/dL (ref 70–99)
Glucose-Capillary: 90 mg/dL (ref 70–99)
Glucose-Capillary: 92 mg/dL (ref 70–99)
Glucose-Capillary: 97 mg/dL (ref 70–99)

## 2019-01-15 NOTE — Progress Notes (Signed)
[facility] PHYSICAL MEDICINE & REHABILITATION PROGRESS NOTE   Subjective/Complaints:  Discussed CBG, Foot drop.  Pt excited about d/c this week   ROS: Patient denies CP, SOB, N/V/D   Objective:   No results found. No results for input(s): WBC, HGB, HCT, PLT in the last 72 hours. No results for input(s): NA, K, CL, CO2, GLUCOSE, BUN, CREATININE, CALCIUM in the last 72 hours.  Intake/Output Summary (Last 24 hours) at 01/15/2019 0851 Last data filed at 01/15/2019 0631 Gross per 24 hour  Intake 720 ml  Output 1375 ml  Net -655 ml     Physical Exam: Vital Signs Blood pressure 118/71, pulse (!) 41, temperature 97.9 F (36.6 C), resp. rate 17, height 5\' 11"  (1.803 m), weight 92.2 kg, SpO2 96 %.   Constitutional: No distress . Vital signs reviewed. HEENT: EOMI, oral membranes moist Neck: supple Cardiovascular: RRR without murmur. No JVD    Respiratory: CTA Bilaterally without wheezes or rales. Normal effort    GI: BS +, non-tender, non-distended  Extremities: No clubbing, cyanosis, or edema Skin: No evidence of breakdown, no evidence of rash Neurologic: good insight and awareness. Cranial nerves II through XII intact, motor strength is 5/5 in LEFT  deltoid, bicep, tricep, grip, hip flexor, knee extensors, ankle dorsiflexor and plantar flexor 3- RIght biceps and finger flexors,3-  sup/pron, 2- thumb ext  forearm  3- Left hip flexion and 3/5 hip /knee ext synergy, 0 at ankle--no real change  Cerebellar exam normal finger to nose to finger as well as heel to shin in left upper and lower extremities Musculoskeletal: Full range of motion in all 4 extremities. No edema Psych: pleasant and up beat   Assessment/Plan: 1. 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  Physiatrist is providing close team supervision and 24 hour management of active medical problems  listed below.  Physiatrist and rehab team continue to assess barriers to discharge/monitor patient progress toward functional and medical goals  Care Tool:  Bathing  Bathing activity did not occur: Refused Body parts bathed by patient: Right arm, Chest, Abdomen, Front perineal area, Buttocks, Right upper leg, Left upper leg, Right lower leg, Left lower leg, Face, Left arm   Body parts bathed by helper: Left arm     Bathing assist Assist Level: Supervision/Verbal cueing(sitting on shower chair, lateral leans for perihygiene)     Upper Body Dressing/Undressing Upper body dressing   What is the patient wearing?: Pull over shirt    Upper body assist Assist Level: Set up assist    Lower Body Dressing/Undressing Lower body dressing      What is the patient wearing?: Pants     Lower body assist Assist for lower body dressing: Supervision/Verbal cueing     Toileting Toileting Toileting Activity did not occur (Clothing management and hygiene only): Refused  Toileting assist Assist for toileting: Moderate Assistance - Patient 50 - 74%     Transfers Chair/bed transfer  Transfers assist  Chair/bed transfer activity did not occur: Safety/medical concerns  Chair/bed transfer assist level: Supervision/Verbal cueing     Locomotion Ambulation   Ambulation assist   Ambulation activity did not occur: Safety/medical concerns  Assist level: Minimal Assistance - Patient > 75% Assistive device: Walker-rolling Max distance: 120 ft   Walk 10 feet activity   Assist  Walk 10 feet activity did not occur: Safety/medical concerns  Assist level: Minimal Assistance - Patient >  75% Assistive device: Walker-rolling   Walk 50 feet activity   Assist Walk 50 feet with 2 turns activity did not occur: Safety/medical concerns  Assist level: Minimal Assistance - Patient > 75% Assistive device: Walker-rolling    Walk 150 feet activity   Assist Walk 150 feet activity did not  occur: Safety/medical concerns  Assist level: Supervision/Verbal cueing Assistive device: Walker-rolling, Orthosis    Walk 10 feet on uneven surface  activity   Assist Walk 10 feet on uneven surfaces activity did not occur: Safety/medical concerns   Assist level: Minimal Assistance - Patient > 75% Assistive device: Walker-rolling, Orthosis   Wheelchair     Assist Will patient use wheelchair at discharge?: Yes Type of Wheelchair: Manual    Wheelchair assist level: Independent Max wheelchair distance: 150 ft    Wheelchair 50 feet with 2 turns activity    Assist        Assist Level: Independent   Wheelchair 150 feet activity     Assist     Assist Level: Independent    Medical Problem List and Plan: 1.Right side weakness with facial droop and dysarthriasecondary to 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 Status post TPA. Patient refused loop recorder  ELOS now 6/26- team conf in am 2. Antithrombotics: -DVT/anticoagulation:SCDs -antiplatelet therapy: Aspirin 81 mg daily 3. Pain Management:Tylenol as needed 4. Mood:Provide emotional support -antipsychotic agents: N/A 5. Neuropsych: This patientiscapable of making decisions on hisown behalf. 6. Skin/Wound Care:Routine skin checks 7. Fluids/Electrolytes/Nutrition:Routine in and outs with follow-up chemistries -encourage PO 8.Diabetes mellitus. Hemoglobin A1c 13.3.Lantus insulin 30units daily. Meal coverage has been discontinued CBG (last 3)  Recent Labs    01/14/19 1613 01/14/19 2151 01/15/19 0629  GLUCAP 134* 122* 71  good control today , am CBG on low side reduce Lantus to 25U  -diabetic education 9. BP  controlled no med Vitals:   01/14/19 1940 01/15/19 0634  BP: 122/77 118/71  Pulse: 64 (!) 41  Resp: 18 17  Temp: 98.2 F (36.8 C) 97.9 F (36.6 C)  SpO2: 93% 96%  Controlled 6/23   10.Medical noncompliance. Counseling 11.  Constipation improved,   Moved bowels on 6/20  LOS: 22 days A FACE TO FACE EVALUATION WAS PERFORMED  [name] 01/15/2019, 8:51 AM

## 2019-01-15 NOTE — Progress Notes (Signed)
Occupational Therapy Session Note  Patient Details  Name: Robert Brewer MRN: [id number] Date of Birth: 20-May-1946  Today's Date: 01/15/2019 OT Individual Time: 0945-1100 OT Individual Time Calculation (min): 75 min    Short Term Goals: Week 3:  OT Short Term Goal 1 (Week 3): STGs=LTGs due to ELOS  Skilled Therapeutic Interventions/Progress Updates:    Pt seen for OT session focusing on neuro-re- ed and movement of R UE. Pt sitting up in w/c upon arrival, agreeable to tx session and denying pain. He request to work on his R arm this session He self propelled w/c throughout unit mod I using hemi-technique.  Completed attended e-stim as follows. 1:1 NMES applied to R forearm to facilitate wrist and finger extension.   Ratio 1:3 Rate 35 pps Waveform- Asymmetric Ramp 1.0 Pulse 300 Intensity-  32 Duration -   12  No adverse reactions after treatment and is skin intact.  Pt with improved active wrist extension in gravity eliminated position following tx.  He returned to supine on mat. Completed R UE ROM exercises from supported supine position. Focus on controlled movements of shoulder ABduction, elbow flexion/extention, and internal/external rotation with VCs for technique. Self hand held ROM for shoulder flexion. Completed x10 with VCs for controlled and normalized movement patterns.  Pt ambulated back to room at end of session with RW and min A with VCs for safety.  Stand pivot transfers completed throughout session with CGA using RW, required VCs to initiate use of RW for transfers as pt desiring to complete without AD. Discussed d/c planning, pt cont with poor insight into deficits, stating he plans to mow grass with walking mower at d/c. Education and examples provided regarding why this is so unsafe, pt voiced understanding, however, cont to state "we'll I think I'm doing good enough to be able to do that".   Pt left seated in w/c at end of session, all needs in reach.   Therapy  Documentation Precautions:  Precautions Precautions: Fall Restrictions Weight Bearing Restrictions: No   Therapy/Group: Individual Therapy  Emilene Roma L 01/15/2019, 7:02 AM

## 2019-01-15 NOTE — Progress Notes (Signed)
Physical Therapy Session Note  Patient Details  Name: Robert Brewer MRN: [id number] Date of Birth: 30-Jul-1945  Today's Date: 01/15/2019 PT Individual Time: 1135-1200 PT Individual Time Calculation (Robert): 25 Robert   Short Term Goals: Week 3:  PT Short Term Goal 1 (Week 3): STG=LTG due to ELOS  Skilled Therapeutic Interventions/Progress Updates:    Pt received seated in w/c in room, agreeable to extra therapy session. Manual w/c mobility with use of L UE/LE at mod I level from room to therapy gym. Stand pivot transfer w/c to/from mat table with RW and close Supervision. Session focus on RLE NMR: alt L/R 1" step-taps, 4" step-taps, cane forward and lateral step-overs x 10-15 reps with RW and Robert A for balance. Pt exhibits decreased control of RLE with onset of fatigue. Pt left seated in w/c in room with needs in reach, chair alarm in place at end of session.  Therapy Documentation Precautions:  Precautions Precautions: Fall Restrictions Weight Bearing Restrictions: No   Therapy/Group: Individual Therapy   Excell Seltzer, PT, DPT  01/15/2019, 12:07 PM

## 2019-01-15 NOTE — Progress Notes (Signed)
Physical Therapy Session Note  Patient Details  Name: DEVONDRE GUZZETTA MRN: [id number] Date of Birth: [birth year]  Today's Date: 01/15/2019 PT Individual Time: 0800-0900 PT Individual Time Calculation (min): 60 min   Short Term Goals: Week 3:  PT Short Term Goal 1 (Week 3): STG=LTG due to ELOS  Skilled Therapeutic Interventions/Progress Updates:    Pt received seated in w/c in room, agreeable to PT session. No complaints of pain. Manual w/c propulsion x 150 ft with use of L UE/LE at mod I level. Sit to stand with Supervision to RW. Ambulation x 180 ft with RW and CGA with R AFO, one instance of near LOB requiring min A to recover due to RLE catching on the floor. Forward/backward amb with RW and CGA 2 x 10 ft each direction. Ambulation across uneven surface (mat on floor/mat on floor with horseshoes underneath) with RW and min A for uneven surface gait training. Focus on safe RW management across uneven surface as well as safety with slow turns with RW. Sit to stand from mat table with no AD and min A with focus on RLE control in standing and eccentric control when sitting; sit to stand with no AD and min A with 1" step under LLE to encourage weight shift to the R. Ambulation with RW navigating through cones with CGA and focus on being safe and slow with obstacle navigation. Pt asking about using a SPC, education with pt that safest to continue with RW at this time. Pt left seated in w/c in room with needs in reach, chair alarm in place at end of session.  Therapy Documentation Precautions:  Precautions Precautions: Fall Restrictions Weight Bearing Restrictions: No Pain: Pain Assessment Pain Scale: 0-10 Pain Score: 0-No pain    Therapy/Group: Individual Therapy   Excell Seltzer, PT, DPT  01/15/2019, 10:06 AM

## 2019-01-15 NOTE — Progress Notes (Addendum)
Physical Therapy Session Note  Patient Details  Name: Robert Brewer MRN: [id number] Date of Birth: 02/26/1946  Today's Date: 01/15/2019 PT Individual Time: 1345-1455 PT Individual Time Calculation (min): 70 min   Short Term Goals: Week 3:  PT Short Term Goal 1 (Week 3): STG=LTG due to ELOS  Skilled Therapeutic Interventions/Progress Updates:   Pt in w/c and agreeable to therapy, no c/o pain. Pt self-propelled w/c to/from therapy gym, mod/i via L hemi technique. Ambulated 100' w/ RW, CGA w/ emphasis on decreasing R hip hike to clear RLE during swing. Worked on [employer] emphasizing R abductor activation. Standing R hip abduction swings within available range and within range that would not require him to lift R pelvis, tactile and visual cues for technique. R lateral step ups w/ rail support, unable to perform w/o heavy LUE support on rail to compensate. Worked on higher level gait this session in anticipation of d/c home w/ distant supervision. Negotiated cones both forwards and sideways w/ RW, discussed environmental situations are home where he would need to negotiate sideways w/ RW. Ambulated in hallway to collect bean bags, emphasis on maintaining balance while reaching and holding onto RW w/ RUE. Bean bags also placed on ground, min assist to squat to reach to floor and verbal cues for safety/precautions. Otherwise, CGA for dynamic gait tasks. Education on household mobility that may require single UE support on walker to reach for items, discussed importance of setting up RW correctly to optimize BOS while reaching. When reminded that he would be performing these tasks while taking care of his cats and he will likely not be safe to do so as he needed min assist, pt stated "I will be able to do it when I get home". Ongoing education regarding his fall risk w/ household mobility.  Intermittent brief seated rest breaks throughout session 2/2 fatigue. Ambulated back to room w/ CGA and increased time,  ended session in w/c and all needs in reach.   Therapy Documentation Precautions:  Precautions Precautions: Fall Restrictions Weight Bearing Restrictions: No Vital Signs:   Therapy/Group: Individual Therapy  [name] 01/15/2019, 5:53 PM

## 2019-01-16 ENCOUNTER — Inpatient Hospital Stay (HOSPITAL_COMMUNITY): Payer: Medicare HMO | Admitting: Physical Therapy

## 2019-01-16 ENCOUNTER — Inpatient Hospital Stay (HOSPITAL_COMMUNITY): Payer: Medicare HMO | Admitting: Occupational Therapy

## 2019-01-16 LAB — GLUCOSE, CAPILLARY
Glucose-Capillary: 84 mg/dL (ref 70–99)
Glucose-Capillary: 87 mg/dL (ref 70–99)
Glucose-Capillary: 95 mg/dL (ref 70–99)
Glucose-Capillary: 126 mg/dL — ABNORMAL HIGH (ref 70–99)

## 2019-01-16 MED ORDER — INSULIN GLARGINE 100 UNIT/ML ~~LOC~~ SOLN
25.0000 [IU] | Freq: Every day | SUBCUTANEOUS | Status: DC
  Administered 2019-01-17 – 2019-01-18 (×2): 25 [IU] via SUBCUTANEOUS
  Filled 2019-01-16 (×3): qty 0.25

## 2019-01-16 NOTE — Progress Notes (Signed)
Physical Therapy Session Note  Patient Details  Name: Robert Brewer MRN: [id number] Date of Birth: [birth year]  Today's Date: 01/16/2019 PT Individual Time: 1200-1230 AND 1405-1515 PT Individual Time Calculation (min): 30 min 70 min   Short Term Goals: Week 3:  PT Short Term Goal 1 (Week 3): STG=LTG due to ELOS  Skilled Therapeutic Interventions/Progress Updates:  Session 1.  Pt received sitting in WC and agreeable to PT. Gait trainnig with RW x 140f with supervision assist overall from PT with occasion CGA for safety in turns and when distracted by conversations. Nustep reciprocal movement training. x 6 min with BUE and BLE and x 6 min with BLE only min cues for proper speed to prevent excessive fatigue as well as improved hip control .  [facility] returned to [facility] and [facility] and all needs met.      Session 2.  Pt received sitting in WC and agreeable to PT. WC mobility 2 x 2576fwithout cues or assist from PT.  Dynamic gait training with RW across mulched surface 2x1220fnd up/down curb step x 2. With close supervision assist-CGA. Weave through 8 cones x 2 with RW, supervision assist throughout  Dynamic balance training to perform foot taps over 1 inch cane 2 x 8 with min assist from PT. 5xSTS x 2. Push from L thigh on first bout and from R thigh on the second bout to force improved WB through the RLE.   Gait training over level surface x 200f7fth RW and supervision assist from PT. No near LOB noted from PT, but required cues for step length on the L to prevent fooyt drag on the R.   Patient returned to room and left sitting in [facility] in reach and all needs met.            Therapy Documentation Precautions:  Precautions Precautions: Fall Precaution Comments: right hemiparesis Restrictions Weight Bearing Restrictions: No Pain: denies   Therapy/Group: Individual Therapy  AustLorie Phenix4/2020, 1:49 PM

## 2019-01-16 NOTE — Patient Care Conference (Signed)
Inpatient RehabilitationTeam Conference and Plan of Care Update Date: 01/16/2019   Time: 10:30 AM    Patient Name: Robert Brewer      Medical Record Number: [id number]  Date of Birth: 01-28-1946 Sex: Male         Room/Bed: 4W22C/4W22C-01 Payor Info: Payor: [employer] MEDICARE / Plan: [employer] MEDICARE HMO / Product Type: *No Product type* /    Admitting Diagnosis: 3. CVA 1 Team  Lt CVA; 22-24days  Admit Date/Time:  12/24/2018  6:04 PM Admission Comments: No comment available   Primary Diagnosis:  <principal problem not specified> Principal Problem: <principal problem not specified>  Patient Active Problem List   Diagnosis Date Noted  . Reactive depression   . Left pontine cerebrovascular accident ([place]) 12/24/2018  . Cerebral thrombosis with cerebral infarction ([facility]) - L paramedian pontnine and L occipital, s/p  tPA   . S/P admn tPA in diff fac w/n last 24 hr bef adm to crnt fac   . Right hemiparesis ([place])   . Dysarthria   . Dysphagia, pharyngoesophageal phase   . Essential hypertension   . Patient non-compliant, refused service   . Non compliance w medication regimen   . Hyperglycemia   . Uncontrolled type 2 diabetes mellitus with hyperglycemia ([place])   . Hyperlipidemia LDL goal <70   . Ischemic cerebrovascular accident (CVA) ([place]) 12/17/2018    Expected Discharge Date: Expected Discharge Date: 01/18/19  Team Members Present: Physician leading conference: Dr. Alysia Penna Social Worker Present: Alfonse Alpers, LCSW Nurse Present: Rayetta Pigg, RN PT Present: Phylliss Bob, PTA;Barrie Folk, PT OT Present: Darleen Crocker, OT SLP Present: Stormy Fabian, SLP PPS Coordinator present : Gunnar Fusi, SLP     Current Status/Progress Goal Weekly Team Focus  Medical   Right upper extremity strength improving slightly, starting to get spasms right lower extremity.  Blood sugars are lower  Maintain medical stability ,neuromuscular reeducation, reduce fall risk  Discharge  planning   Bowel/Bladder   Patient continent of bowel and bladder. Last BM 01/14/2019  Remain continent.  Continue to assist with toileting needs when requested.   Swallow/Nutrition/ Hydration             ADL's   Supervision-Min A overall, CGA-Min A for ambulatory bathroom transfers using RW at ambulatory level. Pt still exhibits poor insight into deficits and decreased safety awareness  supervision overall  Rt NMR, self care retraining, balance, trunk control, functional transfers, d/c planning   Mobility   supervision gait level surfaces, CGA uneven surfaces, CGA stairs continues to demonstrate R knee instability with decreased awarenss of R foot during ambulation but improving, minA for fall recovery  supervision overall  R NMR, fall recovery, gait   Communication             Safety/Cognition/ Behavioral Observations            Pain   No complaints of pain.  Continue to have pain 0/10.  Assess for pain qshift.   Skin   No skin break down noted.  Maintain skin integrity.  Assess skin qshift.    Rehab Goals Patient on target to meet rehab goals: Yes Rehab Goals Revised: none *See Care Plan and progress notes for long and short-term goals.     Barriers to Discharge  Current Status/Progress Possible Resolutions Date Resolved   Physician    Medical stability     Progressing towards goals  Plan discharge in 2 days, caregiver education although hands-on will not be performed, due to  wife's concerns about COVID exposure      Nursing                  PT                    OT Decreased caregiver support;Home environment access/layout;Lack of/limited family support                SLP                SW                Discharge Planning/Teaching Needs:  Pt plans to return to his home with his wife to provide supervision for him as needed.  Wife will not come for family education, so CSW has requested that therapists call wife as needed to talk through things over the phone.    Team Discussion:  Pt's blood sugars are a little low and MD to reduce lantus.  Blood pressure is controlled and pt is stable for d/c.  Pt is doing well for nursing, continent x 2.  He declined Miralax and senna today.  Pt is supervision overall this morning for all ADLs and walked to shower without AFO at supervision level.  Plans to go to the grocery store with his wife when he d/c's despite our recommendations.  Pt is supervision with ambulation on level surfaces and CGA on uneven surfaces.  PT has practiced fall recovery and is almost at goal level.  Revisions to Treatment Plan:  none    Continued Need for Acute Rehabilitation Level of Care: The patient requires daily medical management by a physician with specialized training in physical medicine and rehabilitation for the following conditions: Daily direction of a multidisciplinary physical rehabilitation program to ensure safe treatment while eliciting the highest outcome that is of practical value to the patient.: Yes Daily medical management of patient stability for increased activity during participation in an intensive rehabilitation regime.: Yes Daily analysis of laboratory values and/or radiology reports with any subsequent need for medication adjustment of medical intervention for : Neurological problems;Diabetes problems   I attest that I was present, lead the team conference, and concur with the assessment and plan of the team.Team conference was held via web/ teleconference due to [place] - 19.   Gil Ingwersen, Silvestre Mesi 01/16/2019, 10:52 AM

## 2019-01-16 NOTE — Progress Notes (Signed)
Physical Therapy Session Note  Patient Details  Name: Robert Brewer MRN: [id number] Date of Birth: Apr 07, 1946  Today's Date: 01/16/2019 PT Individual Time: 0950-1030 PT Individual Time Calculation (min): 40 min   Short Term Goals: Week 3:  PT Short Term Goal 1 (Week 3): STG=LTG due to ELOS  Skilled Therapeutic Interventions/Progress Updates: Pt presented in w/c agreeable to therapy and denies pain during session. Session focused on fall recovery and functional mobility on uneven surfaces. Pt indicated had laundry in washer, pt propelled to laundry closet and with door propped open performed ambulatory transfer to washer with RW. Performed reaching in washer and placing in dryer with CGA and use of RW. Pt then able to ambulate backwards to return to w/c. Pt then propelled to rehab gym and performed fall recovery. Pt was able to pull up LLE to high kneeling but unable to push using LUE and LLE to standing. Pt instead leaned into bed and "rolled" onto mat. PTA discussed with pt that this would not be a suitable way to get up if fell in basement (area which poses most risk in house). Pt indicated able to use shelf in basement and also if fell wife has already indicated would call 911. Pt then participated on gait on compliant surface. Pt ambulated on mat, then on mat with bean bags placed under to simulate "ruts/holes" in ground. Pt was able to complete with CGA and increased time. Pt noted not to have any occurences of R foot catching/dragging with this activity. Pt returned to w/c and propelled to ortho gym and participated in ambulation on mat with close S, also performed gait on mulch continuing to require CGA however no LOB noted. Pt also performed car transfer at compact SUV height, pt initially entered car in unsafe manner (leaning into car leading with LLE. PTA advised safer practice currently to back into seat and turn body once seated. Pt verbalized understanding and got out of car in manner  described with CGA. PT propelled back to room mod I and remained in w/c at end of session with current needs met.       Therapy Documentation Precautions:  Precautions Precautions: Fall Precaution Comments: right hemiparesis Restrictions Weight Bearing Restrictions: No General:   Vital Signs: Therapy Vitals Temp: (!) 97.4 F (36.3 C) Temp Source: Oral Pulse Rate: 62 Resp: 20 BP: 120/72 Patient Position (if appropriate): Sitting Oxygen Therapy SpO2: 97 % O2 Device: Room Air    Therapy/Group: Individual Therapy  Danaysha Kirn  Airam Runions, PTA  01/16/2019, 4:11 PM

## 2019-01-16 NOTE — Plan of Care (Signed)
  Problem: RH SKIN INTEGRITY Goal: RH STG MAINTAIN SKIN INTEGRITY WITH ASSISTANCE Description: STG Maintain Skin Integrity With minimal  Assistance.  Outcome: Completed/Met   Problem: RH SAFETY Goal: RH STG ADHERE TO SAFETY PRECAUTIONS W/ASSISTANCE/DEVICE Description: STG Adhere to Safety Precautions With minimal Assistance/Device.  Outcome: Completed/Met Goal: RH STG DECREASED RISK OF FALL WITH ASSISTANCE Description: STG Decreased Risk of Fall With minimal Assistance.  Outcome: Completed/Met   Problem: RH KNOWLEDGE DEFICIT Goal: RH STG INCREASE KNOWLEDGE OF DIABETES Description: independently Outcome: Completed/Met Goal: RH STG INCREASE KNOWLEDGE OF STROKE PROPHYLAXIS Description: Pt will be able to explain secondary stroke prevention independently Outcome: Completed/Met

## 2019-01-16 NOTE — Progress Notes (Signed)
[name] PHYSICAL MEDICINE & REHABILITATION PROGRESS NOTE   Subjective/Complaints:  Discussed CBG management as well as D/C plans  No new c/os  ROS: Patient denies CP, SOB, N/V/D   Objective:   No results found. No results for input(s): WBC, HGB, HCT, PLT in the last 72 hours. No results for input(s): NA, K, CL, CO2, GLUCOSE, BUN, CREATININE, CALCIUM in the last 72 hours.  Intake/Output Summary (Last 24 hours) at 01/16/2019 0916 Last data filed at 01/16/2019 0900 Gross per 24 hour  Intake 840 ml  Output 1000 ml  Net -160 ml     Physical Exam: Vital Signs Blood pressure 125/79, pulse 88, temperature 98 F (36.7 C), temperature source Oral, resp. rate 16, height _0  (1.803 m), weight 92.2 kg, SpO2 98 %.   Constitutional: No distress . Vital signs reviewed. HEENT: EOMI, oral membranes moist Neck: supple Cardiovascular: RRR without murmur. No JVD    Respiratory: CTA Bilaterally without wheezes or rales. Normal effort    GI: BS +, non-tender, non-distended  Extremities: No clubbing, cyanosis, or edema Skin: No evidence of breakdown, no evidence of rash Neurologic: good insight and awareness.motor strength is 5/5 in LEFT  deltoid, bicep, tricep, grip, hip flexor, knee extensors, ankle dorsiflexor and plantar flexor 3- RIght biceps and finger flexors,3-  sup/pron, 2- thumb ext  forearm  3- Left hip flexion and 3/5 hip /knee ext synergy, 0 at ankle--no real change   Musculoskeletal: Full range of motion in all 4 extremities. No edema Psych:no lability or agitation    Assessment/Plan: 1. 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  Physiatrist is providing close team supervision and 24 hour management of active medical problems listed below.  Physiatrist and rehab team continue to assess barriers to discharge/monitor patient progress toward  functional and medical goals  Care Tool:  Bathing  Bathing activity did not occur: Refused Body parts bathed by patient: Right arm, Chest, Abdomen, Front perineal area, Buttocks, Right upper leg, Left upper leg, Right lower leg, Left lower leg, Face, Left arm   Body parts bathed by helper: Left arm     Bathing assist Assist Level: Supervision/Verbal cueing     Upper Body Dressing/Undressing Upper body dressing   What is the patient wearing?: Pull over shirt    Upper body assist Assist Level: Supervision/Verbal cueing    Lower Body Dressing/Undressing Lower body dressing      What is the patient wearing?: Pants     Lower body assist Assist for lower body dressing: Supervision/Verbal cueing     Toileting Toileting Toileting Activity did not occur (Clothing management and hygiene only): Refused  Toileting assist Assist for toileting: Supervision/Verbal cueing     Transfers Chair/bed transfer  Transfers assist  Chair/bed transfer activity did not occur: Safety/medical concerns  Chair/bed transfer assist level: Supervision/Verbal cueing     Locomotion Ambulation   Ambulation assist   Ambulation activity did not occur: Safety/medical concerns  Assist level: Contact Guard/Touching assist Assistive device: Walker-rolling Max distance: 100'   Walk 10 feet activity   Assist  Walk 10 feet activity did not occur: Safety/medical concerns  Assist level: Contact Guard/Touching assist Assistive device: Walker-rolling   Walk 50 feet activity   Assist Walk 50 feet with 2 turns activity did not occur: Safety/medical concerns  Assist level: Contact Guard/Touching assist Assistive device: Walker-rolling    Walk 150 feet activity   Assist  Walk 150 feet activity did not occur: Safety/medical concerns  Assist level: Contact Guard/Touching assist Assistive device: Walker-rolling    Walk 10 feet on uneven surface  activity   Assist Walk 10 feet on uneven  surfaces activity did not occur: Safety/medical concerns   Assist level: Minimal Assistance - Patient > 75% Assistive device: Walker-rolling, Orthosis   Wheelchair     Assist Will patient use wheelchair at discharge?: Yes Type of Wheelchair: Manual    Wheelchair assist level: Independent Max wheelchair distance: 150'    Wheelchair 50 feet with 2 turns activity    Assist        Assist Level: Independent   Wheelchair 150 feet activity     Assist     Assist Level: Independent    Medical Problem List and Plan: 1.Right side weakness with facial droop and dysarthriasecondary to 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 Status post TPA. Patient refused loop recorder  ELOS now 6/26- Team conference today please see physician documentation under team conference tab, met with team face-to-face to discuss problems,progress, and goals. Formulized individual treatment plan based on medical history, underlying problem and comorbidities. 2. Antithrombotics: -DVT/anticoagulation:SCDs -antiplatelet therapy: Aspirin 81 mg daily 3. Pain Management:Tylenol as needed 4. Mood:Provide emotional support -antipsychotic agents: N/A 5. Neuropsych: This patientiscapable of making decisions on hisown behalf. 6. Skin/Wound Care:Routine skin checks 7. Fluids/Electrolytes/Nutrition:Routine in and outs with follow-up chemistries -encourage PO 8.Diabetes mellitus. Hemoglobin A1c 13.3.Lantus insulin 30units daily. Meal coverage has been discontinued CBG (last 3)  Recent Labs    01/15/19 1640 01/15/19 2156 01/16/19 0600  GLUCAP 92 97 84  good control today , am CBG on low side reduce Lantus to 25U 6/24 -diabetic education 9. BP  controlled no med Vitals:   01/15/19 1949 01/16/19 0548  BP: 116/73 125/79  Pulse: (!) 55 88  Resp:  16  Temp: 99.1 F (37.3 C) 98 F (36.7 C)  SpO2: 96% 98%   Controlled 6/24  10.Medical noncompliance. Counseling 11.  Constipation improved,   Moved bowels on 6/20  LOS: 23 days A FACE TO FACE EVALUATION WAS PERFORMED  [name] 01/16/2019, 9:16 AM

## 2019-01-16 NOTE — Progress Notes (Signed)
Occupational Therapy Session Note  Patient Details  Name: Robert Brewer MRN: [id number] Date of Birth: 11/02/1945  Today's Date: 01/16/2019 OT Individual Time: 0800-0911 OT Individual Time Calculation (min): 71 min    Short Term Goals: Week 3:  OT Short Term Goal 1 (Week 3): STGs=LTGs due to ELOS  Skilled Therapeutic Interventions/Progress Updates:    Upon entering the room, pt seated in wheelchair with no c/o pain this session. Pt agreeable to OT intervention and reports, " I guess I ought to take a shower this morning." Pt ambulating with grip socks only and use of RW 10' into bathroom with close supervision to shower seat. Pt bathing at shower level from seated position with close supervision for safety. Pt transferring onto commode chair to don clothing items with close supervision and min cuing for safety awareness. Pt was able to button and zip pants with one handed technique. Pt propelled wheelchair with dirty clothing in lap towards laundry room with hemiplegic technique. Pt standing and ambulating into laundry room with use of RW and putting all clothing in washing machine and adding turning on before returning back to wheelchair at supervision level. Pt returning to room in same manner and donning shoes and AFO with supervision and use of modifications as needed for independence. Pt remained in wheelchair with chair alarm activated and all needs within reach. Pt does verbalize plan to walk around grocery store on discharge day to buy groceries and OT requesting pt not perform this task or utilize motorized scooter for safety.  Therapy Documentation Precautions:  Precautions Precautions: Fall Precaution Comments: right hemiparesis Restrictions Weight Bearing Restrictions: No General:   Vital Signs: Therapy Vitals Temp: 98 F (36.7 C) Temp Source: Oral Pulse Rate: 88 Resp: 16 BP: 125/79 Patient Position (if appropriate): Lying Oxygen Therapy SpO2: 98 % O2 Device: Room  Air Pain: Pain Assessment Pain Scale: 0-10 Pain Score: 0-No pain ADL:   Vision Baseline Vision/History: No visual deficits Patient Visual Report: No change from baseline;Other (comment) Vision Assessment?: No apparent visual deficits Perception    Praxis   Exercises:   Other Treatments:     Therapy/Group: Individual Therapy  [name] 01/16/2019, 9:17 AM

## 2019-01-17 ENCOUNTER — Inpatient Hospital Stay (HOSPITAL_COMMUNITY): Payer: Medicare HMO | Admitting: Occupational Therapy

## 2019-01-17 ENCOUNTER — Inpatient Hospital Stay (HOSPITAL_COMMUNITY): Payer: Medicare HMO | Admitting: Physical Therapy

## 2019-01-17 LAB — GLUCOSE, CAPILLARY
Glucose-Capillary: 85 mg/dL (ref 70–99)
Glucose-Capillary: 81 mg/dL (ref 70–99)
Glucose-Capillary: 149 mg/dL — ABNORMAL HIGH (ref 70–99)
Glucose-Capillary: 200 mg/dL — ABNORMAL HIGH (ref 70–99)

## 2019-01-17 MED ORDER — INSULIN GLARGINE 100 UNIT/ML SOLOSTAR PEN: 25.0000 [IU] | PEN_INJECTOR | Freq: Every day | SUBCUTANEOUS | 11 refills | Status: DC

## 2019-01-17 NOTE — Discharge Summary (Signed)
Physician Discharge Summary  Patient ID: Robert Brewer MRN: [id number] DOB/AGE: March 21, 1946 73 y.o.  Admit date: 12/24/2018 Discharge date: 01/18/2019  Discharge Diagnoses:  Active Problems:   Left pontine cerebrovascular accident [facility])   Reactive depression DVT prophylaxis Diabetes mellitus Hypertension Medical noncompliance Constipation Discharged Condition: Stable  Significant Diagnostic Studies: No results found.  Labs:  Basic Metabolic Panel: No results for input(s): NA, K, CL, CO2, GLUCOSE, BUN, CREATININE, CALCIUM, MG, PHOS in the last 168 hours.  CBC: No results for input(s): WBC, NEUTROABS, HGB, HCT, MCV, PLT in the last 168 hours.  CBG: Recent Labs  Lab 01/16/19 2140 01/17/19 0629 01/17/19 1152 01/17/19 1653 01/17/19 2132  GLUCAP 126* 85 81 149* 200*   Family history.  Positive for hypertension and hyperlipidemia as well as diabetes mellitus.  Denies any colon cancer.  Brief HPI:  Robert Brewer is a 73 year old right-handed male with history of diabetes mellitus, hypertension and medical noncompliance.  On no prescription medications.  Lives with spouse.  Independent prior to admission.  Presented to [facility] 12/17/2018 with acute onset of right side weakness slurred speech and facial droop while mowing the grass.  CT of the head at outside hospital showed no hemorrhage.  Patient did receive TPA was transferred to [facility].  CT angiogram of head and neck showed 1 cm hypodensity involving the left paramedian pons and suspicious for evolving acute ischemic infarction.  CTA head and neck with no large vessel occlusion no significant intraluminal thrombus or dissection identified.  MRI confirms acute infarct left paramedian pons small acute cortical infarct left occipital lobe.  Negative for hemorrhage.  Maintained on aspirin for CVA prophylaxis.  Echocardiogram with ejection fraction of 31% normal systolic function negative bubble study.   Recommendations were made for loop recorder but patient refused.  Hemoglobin A1c 13.3 insulin therapy as directed.  Patient was admitted for a comprehensive rehab program  Physical exam.  Blood pressure 130/79 pulse 57 temperature 98.4 respirations 16 oxygen saturations 95% room air Constitutional.  Well-developed no distress HEENT Head.  Normocephalic and atraumatic Eyes.  Pupils round and reactive to light without discharge no nystagmus Neck.  Normal range of motion supple nontender no thyromegaly Cardiovascular.  Normal rate exam reveals no friction rub or murmur heard Respiratory.  Effort normal no respiratory distress without wheeze GI.  Soft exhibits no distention nontender positive bowel sounds Neurological.  Alert dysarthric but intelligible follows commands fair awareness of deficits right upper extremity 0 out of 5 proximal to distal right lower extremity 3- out of 5 proximal to trace 0 out of 5 ankle dorsi plantarflexion left upper left lower extremity 4- 4 out of 5  Hospital Course: Robert Brewer was admitted to rehab 12/24/2018 for inpatient therapies to consist of PT, ST and OT at least three hours five days a week. Past admission physiatrist, therapy team and rehab RN have worked together to provide customized collaborative inpatient rehab.  Pertaining to patient left paramedian pontine infarction remained stable maintained on aspirin therapy he would follow-up with neurology services.  SCDs for DVT prophylaxis.  Noted hemoglobin A1c of 13.3 insulin therapy as directed full diabetic teaching.  Blood pressure overall remained controlled there were recommendations to follow with PCP.  Bouts of constipation resolved with laxative assistance.  Patient with history of medical noncompliance on no prescription medications at time of admission recommendations were for PCP to follow-up medical wellness.   Rehab course: During patient's stay in rehab weekly team conferences were  held to monitor  patient's progress, set goals and discuss barriers to discharge. At admission, patient required moderate assist ambulate 6 feet rolling walker, moderate assist stand pivot transfers, moderate assist sit to stand.  Minimal assist upper body bathing max assist lower body bathing moderate assist upper body dressing max assist lower body dressing  He  has had improvement in activity tolerance, balance, postural control as well as ability to compensate for deficits. He has had improvement in functional use RUE/LUE  and RLE/LLE as well as improvement in awareness.  Patient ambulating 180 feet supervision occasional contact-guard for safety using a rolling walker.  Energy conservation techniques.  Wheelchair mobility without cues or assistance.  Dynamic gait training with rolling walker across mulched surface 2 x 12 feet and up and down curb step x2 with close supervision assist.  Ambulating with grip socks with ADLs using rolling walker into the bathroom close supervision to shower seat.  Patient bathing and shower level from seated position with close supervision for safety.  Transferring onto commode chair to don clothing items with close supervision and minimal cueing for safety awareness.  Patient could return to his room he was wearing an AFO brace with supervision use of modifications as needed for independence.  Full family teaching completed plan discharge to home       Disposition: Discharge disposition: 01-Home or Self Care     Discharge to home   Diet: Diabetic diet  Special Instructions: No smoking drinking or alcohol  Referral obtained with Endocrinology Dr Renne Crigler  Medications at discharge. 1.  Tylenol as needed 2.  Aspirin 81 mg p.o. daily 3.  Lantus insulin 25 units daily 4.  MiraLAX daily hold for loose stools 5.  Senokot S2 tablets p.o. twice daily  Discharge Instructions    Ambulatory referral to Internal Medicine   Complete by: As directed    Ambulatory referral to  Neurology   Complete by: As directed    An appointment is requested in approximately 4 weeks left paramedian pontine infarction   Ambulatory referral to Physical Medicine Rehab   Complete by: As directed    Moderate complexity follow-up 1 to 2 weeks left pontine infarction      Follow-up Information    Kirsteins, Luanna Salk, MD Follow up.   Specialty: Physical Medicine and Rehabilitation Why: Office to call for appointment Contact information: [place] [address] [phone]        Cher Nakai, MD. Go on 01/24/2019.   Specialty: Internal Medicine Why: @ 2pm - Please wear your mask before entering the building.  Anyone coming with you needs to do the same. Contact information: [place] [address] [phone]           Signed: Lavon Paganini [place] 01/18/2019, 5:20 AM

## 2019-01-17 NOTE — Progress Notes (Signed)
Physical Therapy Session Note  Patient Details  Name: Robert Brewer MRN: [id number] Date of Birth: Aug 01, 1945  Today's Date: 01/17/2019 PT Individual Time: 0906-0959 PT Individual Time Calculation (min): 53 min   Short Term Goals: Week 3:  PT Short Term Goal 1 (Week 3): STG=LTG due to ELOS  Skilled Therapeutic Interventions/Progress Updates:   Pt received sitting in w/c and agreeable to therapy session. Performed w/c management and L hemi-technique w/c propulsion for >341ft throughout session mod-I. Stepped up/down 1 step x2 using RW with CGA for steadying and education/cuing for proper AD management and getting closer to step. Ambulated over ~28ft of mulch x4 using RW with varying CGA/close supervision for safety with education on the importance of focusing on the task and increasing R LE foot clearance for increased safety/balance. Pt educated on fall risk safety in the home and how to perform floor transfer. Performed 2x floor transfer: on 1st trial required min assist for R LE management and pivoting hips to EOM with pt able to achieve half kneeling position with L LE anterior and pivot hips onto mat to replicate getting onto the couch at home; on 2nd trial pt required close supervision but was able to complete entire transfer without hands-on assistance and supervision for safety, achieved half kneeling position with R LE anterior and then pivoted hips onto EOM. Pt able to recall fall risk education during both floor transfers without cuing. Ambulated ~14ft back to room using RW (R LE AFO donned) with CGA due to intermittent minor R knee buckling and intermittent unsteadiness with turning. Pt educated on using w/c as primary means of mobility in the home for increased safety. Pt left sitting in w/c with needs in reach and chair alarm on.   Therapy Documentation Precautions:  Precautions Precautions: Fall Precaution Comments: right hemiparesis Restrictions Weight Bearing Restrictions:  No  Pain:   Denies pain during session.   Therapy/Group: Individual Therapy  Tawana Scale, PT, DPT 01/17/2019, 7:56 AM

## 2019-01-17 NOTE — Discharge Instructions (Signed)
Inpatient Rehab Discharge Instructions  Robert Brewer Discharge date and time: No discharge date for patient encounter.   Activities/Precautions/ Functional Status: Activity: activity as tolerated Diet: diabetic diet Wound Care: none needed Functional status:  ___ No restrictions     ___ Walk up steps independently ___ 24/7 supervision/assistance   ___ Walk up steps with assistance ___ Intermittent supervision/assistance  ___ Bathe/dress independently ___ Walk with walker     _x__ Bathe/dress with assistance ___ Walk Independently    ___ Shower independently ___ Walk with assistance    ___ Shower with assistance ___ No alcohol     ___ Return to work/school ________  COMMUNITY REFERRALS UPON DISCHARGE:   Outpatient: PT     OT  Agency:  [facility] Phone:  [phone]  Appointment Date/Time:  01-22-19 @ 1pm (PT) and 2pm (OT) - arrive at 12:45pm Medical Equipment/Items Ordered:  20"x18" wheelchair with basic cushion; rolling walker  Agency/Supplier:  [employer]        Phone:  [phone]  GENERAL COMMUNITY RESOURCES FOR PATIENT/FAMILY: Support Groups:  [facility] Stroke Support Group - on hold for COVID-19 currently                              Meets the second Thursday of each month from 6 - 7 PM (except June, July, August)                              [facility], [address], [place]                               For more information, call [phone]  No smoking driving or alcohol STROKE/TIA DISCHARGE INSTRUCTIONS SMOKING Cigarette smoking nearly doubles your risk of having a stroke & is the single most alterable risk factor  If you smoke or have smoked in the last 12 months, you are advised to quit smoking for your health.  Most of the excess cardiovascular risk related to smoking disappears within a year of stopping.  Ask you doctor about anti-smoking medications  [place] Quit  Line: 1-800-QUIT NOW  Free Smoking Cessation Classes [phone]  CHOLESTEROL Know your levels; limit fat & cholesterol in your diet  Lipid Panel     Component Value Date/Time   CHOL 227 (H) 12/17/2018 0212   TRIG 81 12/17/2018 0212   HDL 45 12/17/2018 0212   CHOLHDL 5.0 12/17/2018 0212   VLDL 16 12/17/2018 0212   LDLCALC 166 (H) 12/17/2018 0212      Many patients benefit from treatment even if their cholesterol is at goal.  Goal: Total Cholesterol (CHOL) less than 160  Goal:  Triglycerides (TRIG) less than 150  Goal:  HDL greater than 40  Goal:  LDL (LDLCALC) less than 100   BLOOD PRESSURE American Stroke Association blood pressure target is less that 120/80 mm/Hg  Your discharge blood pressure is:  BP: (!) 141/80  Monitor your blood pressure  Limit your salt and alcohol intake  Many individuals will require more than one medication for high blood pressure  DIABETES (A1c is a blood sugar average for last 3 months) Goal HGBA1c is under 7% (HBGA1c is blood sugar average for last 3 months)  Diabetes:   Lab Results  Component Value Date  HGBA1C 13.3 (H) 12/17/2018     Your HGBA1c can be lowered with medications, healthy diet, and exercise.  Check your blood sugar as directed by your physician  Call your physician if you experience unexplained or low blood sugars.  PHYSICAL ACTIVITY/REHABILITATION Goal is 30 minutes at least 4 days per week  Activity: Increase activity slowly, Therapies: Physical Therapy: Home Health Return to work:   Activity decreases your risk of heart attack and stroke and makes your heart stronger.  It helps control your weight and blood pressure; helps you relax and can improve your mood.  Participate in a regular exercise program.  Talk with your doctor about the best form of exercise for you (dancing, walking, swimming, cycling).  DIET/WEIGHT Goal is to maintain a healthy weight  Your discharge diet is:  Diet Order            Diet  Carb Modified Fluid consistency: Thin; Room service appropriate? Yes with Assist  Diet effective now              liquids Your height is:  Height: 5\' 11"  (180.3 cm) Your current weight is: Weight: 97.3 kg Your Body Mass Index (BMI) is:  BMI (Calculated): 29.93  Following the type of diet specifically designed for you will help prevent another stroke.  Your goal weight range is:    Your goal Body Mass Index (BMI) is 19-24.  Healthy food habits can help reduce 3 risk factors for stroke:  High cholesterol, hypertension, and excess weight.  RESOURCES Stroke/Support Group:  Call [phone]   STROKE EDUCATION PROVIDED/REVIEWED AND GIVEN TO PATIENT Stroke warning signs and symptoms How to activate emergency medical system (call 911). Medications prescribed at discharge. Need for follow-up after discharge. Personal risk factors for stroke. Pneumonia vaccine given:  Flu vaccine given:  My questions have been answered, the writing is legible, and I understand these instructions.  I will adhere to these goals & educational materials that have been provided to me after my discharge from the hospital.      My questions have been answered and I understand these instructions. I will adhere to these goals and the provided educational materials after my discharge from the hospital.  Patient/Caregiver Signature _______________________________ Date __________  Clinician Signature _______________________________________ Date __________  Please bring this form and your medication list with you to all your follow-up doctor's appointments.

## 2019-01-17 NOTE — Progress Notes (Signed)
Physical Therapy Discharge Summary  Patient Details  Name: Robert Brewer MRN: [id number] Date of Birth: [birth year]  Today's Date: 01/17/2019 PT Individual Time: 1300-1410 PT Individual Time Calculation (min): 70 min    Patient has met 9 of 9 long term goals due to improved activity tolerance, improved balance, improved postural control, increased strength, increased range of motion, ability to compensate for deficits, functional use of  right upper extremity and right lower extremity, improved attention, improved awareness and improved coordination.  Patient to discharge at an ambulatory level Supervision.   Patient's care partner unavailable to provide the necessary physical assistance at discharge.  Reasons goals not met: All PT goals met   Recommendation:  Patient will benefit from ongoing skilled PT services in outpatient setting to continue to advance safe functional mobility, address ongoing impairments in balance, transfers, gait, stairs, R sided Neuromotor control, and minimize fall risk.  Equipment: WC and RW  Reasons for discharge: treatment goals met and discharge from hospital  Patient/family agrees with progress made and goals achieved: Yes   PT treatment:  Pt received sitting in WC and agreeable to PT. WC mobility throughout treatment without cues or assist from PT 3 x 258f. PT instructed pt in Grad day assessment to measure progress toward goals. See below for details. Gait training, basic and car transfers with RW and supervision assist as listed below.  Stair management training x 12 with supervision assist as listed below.  Modified [place] level A instructed by PT with hand out provided. [facility] and all [facility] met.       PT Discharge Precautions/Restrictions Precautions Precautions: Fall Precaution Comments: right hemiparesis Restrictions Weight Bearing Restrictions: No Pain Pain Assessment Pain  Scale: 0-10 Pain Score: 0-No pain Vision/Perception  Perception Perception: Within Functional Limits Praxis Praxis: Intact  Cognition Overall Cognitive Status: Within Functional Limits for tasks assessed Arousal/Alertness: Awake/alert Orientation Level: Oriented X4 Attention: Sustained;Selective Focused Attention: Appears intact Sustained Attention: Appears intact Selective Attention: Appears intact Awareness: Impaired Awareness Impairment: Anticipatory impairment Sequencing: Appears intact Safety/Judgment: Impaired Comments: Pt with some decreased awareness noted with regards to activities he wants to be able to do at home, such as tend to his cats outside, even though this has been discussed in therapy and recommended that he have assist with it until his balance and strength improve more. Sensation Sensation Light Touch: Appears Intact Hot/Cold: Appears Intact Proprioception: Appears Intact Stereognosis: Not tested Coordination Gross Motor Movements are Fluid and Coordinated: No Fine Motor Movements are Fluid and Coordinated: No Coordination and Movement Description: RUE / R LE hemiplegia still present. Motor  Motor Motor: Hemiplegia Motor - Discharge Observations: Pt sitll with significant RUE> RLE hemiparesis  Mobility Bed Mobility Bed Mobility: Supine to Sit Rolling Right: Independent Rolling Left: Independent Supine to Sit: Independent Sit to Supine: Independent Transfers Transfers: Sit to Stand;Stand to Sit;Stand Pivot Transfers Sit to Stand: Supervision/Verbal cueing Stand to Sit: Supervision/Verbal cueing Stand Pivot Transfers: Supervision/Verbal cueing Transfer (Assistive device): Rolling walker Locomotion  Gait Ambulation: Yes Gait Assistance: Supervision/Verbal cueing Gait Distance (Feet): 150 Feet Assistive device: Rolling walker;Other (Comment)(AFO) Gait Gait Pattern: Right steppage;Right circumduction;Lateral hip instability Stairs / Additional  Locomotion Stairs: Yes Stairs Assistance: Supervision/Verbal cueing Stair Management Technique: One rail Left Number of Stairs: 12 Height of Stairs: 6 Wheelchair Mobility Wheelchair Mobility: Yes Wheelchair Assistance: Independent with aCamera operator Both lower extermities;Left upper extremity Wheelchair Parts Management: Independent Distance: 150  Trunk/Postural Assessment  Cervical Assessment Cervical Assessment: Within Functional Limits Thoracic Assessment Thoracic Assessment: Exceptions to WFL(thoracic rounding) Lumbar Assessment Lumbar Assessment: Exceptions to WFL(posterior pelvic tilt at rest) Postural Control Protective Responses: delayed  Balance Balance Balance Assessed: Yes Static Sitting Balance Static Sitting - Balance Support: Feet supported Static Sitting - Level of Assistance: 7: Independent Dynamic Sitting Balance Dynamic Sitting - Balance Support: During functional activity;No upper extremity supported Dynamic Sitting - Level of Assistance: 5: Stand by assistance Static Standing Balance Static Standing - Balance Support: During functional activity;Left upper extremity supported Static Standing - Level of Assistance: 5: Stand by assistance Dynamic Standing Balance Dynamic Standing - Balance Support: During functional activity Dynamic Standing - Level of Assistance: 5: Stand by assistance Extremity Assessment  RUE Assessment RUE Assessment: Exceptions to [facility] Passive Range of Motion (PROM) Comments: [facility] Strength Comments: Brunnstrum stage III movement in the right shoulder and elbow with Brunnstrum stage IV movement in the hand.  Gross grasp of 90% with extension approximately 35%.   Noted one finger anterior inferior subluxation present as well.  He is able to use the RUE as a gross assist for holding objects to be open with supervsion.  Max hand over hand assistance for use as an active assist for bathing or dressing. LUE  Assessment LUE Assessment: Within Functional Limits RLE Assessment Passive Range of Motion (PROM) Comments: WFL RLE Strength Right Hip Flexion: 4-/5 Right Knee Flexion: 4-/5 Right Knee Extension: 4/5 Right Ankle Dorsiflexion: 0/5      Lorie Phenix 01/17/2019, 2:13 PM

## 2019-01-17 NOTE — Progress Notes (Signed)
Patient ID: Robert Brewer, male   DOB: 1946-01-01, 73 y.o.   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  A cane or walker will not resolve issue with performing activities of daily living.  A wheelchair will allow patient to safely perform daily activities.  Patient is not able to propel himself in the home using a standard weight wheelchair due to arm weakness and endurance.  Patient can self propel in the lightweight wheelchair.

## 2019-01-17 NOTE — Progress Notes (Signed)
[facility] PHYSICAL MEDICINE & REHABILITATION PROGRESS NOTE   Subjective/Complaints:  No issues overnite   ROS: Patient denies CP, SOB, N/V/D   Objective:   No results found. No results for input(s): WBC, HGB, HCT, PLT in the last 72 hours. No results for input(s): NA, K, CL, CO2, GLUCOSE, BUN, CREATININE, CALCIUM in the last 72 hours.  Intake/Output Summary (Last 24 hours) at 01/17/2019 0847 Last data filed at 01/17/2019 0528 Gross per 24 hour  Intake 600 ml  Output 900 ml  Net -300 ml     Physical Exam: Vital Signs Blood pressure 121/70, pulse (!) 48, temperature 98.1 F (36.7 C), resp. rate 18, height 5\' 11"  (1.803 m), weight 95.3 kg, SpO2 95 %.   Constitutional: No distress . Vital signs reviewed. HEENT: EOMI, oral membranes moist Neck: supple Cardiovascular: RRR without murmur. No JVD    Respiratory: CTA Bilaterally without wheezes or rales. Normal effort    GI: BS +, non-tender, non-distended  Extremities: No clubbing, cyanosis, or edema Skin: No evidence of breakdown, no evidence of rash Neurologic: good insight and awareness.motor strength is 5/5 in LEFT  deltoid, bicep, tricep, grip, hip flexor, knee extensors, ankle dorsiflexor and plantar flexor 3- RIght biceps and finger flexors,3-  sup/pron, 2- thumb ext  forearm  3- Left hip flexion and 3/5 hip /knee ext synergy, 0 at ankle--no real change   Musculoskeletal: Full range of motion in all 4 extremities. No edema Psych:no lability or agitation    Assessment/Plan: 1. 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  Physiatrist is providing close team supervision and 24 hour management of active medical problems listed below.  Physiatrist and rehab team continue to assess barriers to discharge/monitor patient progress toward functional and medical goals  Care Tool:  Bathing  Bathing  activity did not occur: Refused Body parts bathed by patient: Right arm, Chest, Abdomen, Front perineal area, Buttocks, Right upper leg, Left upper leg, Right lower leg, Left lower leg, Face, Left arm   Body parts bathed by helper: Left arm     Bathing assist Assist Level: Supervision/Verbal cueing     Upper Body Dressing/Undressing Upper body dressing   What is the patient wearing?: Pull over shirt    Upper body assist Assist Level: Supervision/Verbal cueing    Lower Body Dressing/Undressing Lower body dressing      What is the patient wearing?: Pants     Lower body assist Assist for lower body dressing: Supervision/Verbal cueing     Toileting Toileting Toileting Activity did not occur (Clothing management and hygiene only): Refused  Toileting assist Assist for toileting: Supervision/Verbal cueing     Transfers Chair/bed transfer  Transfers assist  Chair/bed transfer activity did not occur: Safety/medical concerns  Chair/bed transfer assist level: Supervision/Verbal cueing     Locomotion Ambulation   Ambulation assist   Ambulation activity did not occur: Safety/medical concerns  Assist level: Contact Guard/Touching assist Assistive device: Walker-rolling Max distance: 100'   Walk 10 feet activity   Assist  Walk 10 feet activity did not occur: Safety/medical concerns  Assist level: Contact Guard/Touching assist Assistive device: Walker-rolling   Walk 50 feet activity   Assist Walk 50 feet with 2 turns activity did not occur: Safety/medical concerns  Assist level: Contact Guard/Touching assist Assistive device: Walker-rolling    Walk 150 feet activity   Assist Walk 150 feet activity did not occur: Safety/medical concerns  Assist level: Contact Guard/Touching assist Assistive device: Walker-rolling    Walk 10 feet on uneven surface  activity   Assist Walk 10 feet on uneven surfaces activity did not occur: Safety/medical  concerns   Assist level: Minimal Assistance - Patient > 75% Assistive device: Walker-rolling, Orthosis   Wheelchair     Assist Will patient use wheelchair at discharge?: Yes Type of Wheelchair: Manual    Wheelchair assist level: Independent Max wheelchair distance: 150'    Wheelchair 50 feet with 2 turns activity    Assist        Assist Level: Independent   Wheelchair 150 feet activity     Assist     Assist Level: Independent    Medical Problem List and Plan: 1.Right side weakness with facial droop and dysarthriasecondary to 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 Status post TPA. Patient refused loop recorder CIR PT, OT,  SLP, d/c in am  2. Antithrombotics: -DVT/anticoagulation:SCDs -antiplatelet therapy: Aspirin 81 mg daily 3. Pain Management:Tylenol as needed 4. Mood:Provide emotional support -antipsychotic agents: N/A 5. Neuropsych: This patientiscapable of making decisions on hisown behalf. 6. Skin/Wound Care:Routine skin checks 7. Fluids/Electrolytes/Nutrition:Routine in and outs with follow-up chemistries -encourage PO 8.Diabetes mellitus. Hemoglobin A1c 13.3.Lantus insulin 30units daily. Meal coverage has been discontinued CBG (last 3)  Recent Labs    01/16/19 1733 01/16/19 2140 01/17/19 0629  GLUCAP 95 126* 85  good control today , am CBG on low side reduce Lantus to 25U 6/24 -diabetic education 9. BP  controlled no med Vitals:   01/16/19 1943 01/17/19 0525  BP: 122/73 121/70  Pulse: 63 (!) 48  Resp: 19 18  Temp: 98.1 F (36.7 C) 98.1 F (36.7 C)  SpO2: 94% 95%  Controlled 6/25  10.Medical noncompliance. Counseling 11.  Constipation improved,   Moved bowels on 6/20  LOS: 24 days A FACE TO FACE EVALUATION WAS PERFORMED  [name] 01/17/2019, 8:47 AM

## 2019-01-17 NOTE — Progress Notes (Signed)
Occupational Therapy Discharge Summary  Patient Details  Name: Robert Brewer MRN: [id number] Date of Birth: 10-Apr-1946  Today's Date: 01/17/2019 OT Individual Time: 2542-7062 OT Individual Time Calculation (min): 60 min   Session Note:  Pt completed donning of right sock, shoe, and AFO with supervision to start the session.  He also completed toilet transfer with use of the RW for support.  Min instructional cueing for keeping the walker in front of him when turning around to the toilet, as he wanted to leave it to the side and use the grab bars.  He currently does not have grab bars at home, but may get one installed.  Supervision was then needed for managing clothing.  Next pt rolled himself down to the ADL apartment where he completed simulated walk-in shower transfer using the simulated shower edge and small seat.  He was able to step over posteriorly and then step out anteriorly with supervision using the RW for support.  Transitioned to the therapy session for remainder of session with focus on RUE neuromuscular re-education.  Therapist appled NMES to the dorsal right digit extensors with intensity set on level 34, PPS at 35 with pulse width at 300.  He was able to tolerate 20 mins of simulation for 10 seconds on and 5 seconds off, while working on elbow extension with RUE on table for support.  Had pt work on sliding his arm forward on the table to push a bean bag off of the end while stimulation was active.  Min to mod assist for elbow extension with shoulder flexion during activity.  Finished session with return to the room with call button and phone in reach and pt staying up in the wheelchair.  Chair alarm in place as well.     Patient has met 12 of 12 long term goals due to improved activity tolerance, improved balance, postural control, ability to compensate for deficits, functional use of  RIGHT upper and RIGHT lower extremity, improved attention, improved awareness and improved  coordination.  Patient to discharge at overall Supervision level.  Patient's care partner unavailable to provide the necessary physical and cognitive assistance at discharge.  Spouse declined to come in for education and was only educated on pt's current performance and assist level via phone conversations.  Reasons goals not met: NA  Recommendation:  Patient will benefit from ongoing skilled OT services in home health setting to continue to advance functional skills in the area of BADL and Reduce care partner burden.  Pt is a significant fall risk at home and still needs close supervision 24 hr for all selfcare tasks, toilet transfers, shower transfers, and functional mobility.  Per chart his spouse is aware of this and will provide assist at the home.  Feel he will benefit from continued HHOT to further progress independence level and increase safety so he is less dependent on his spouse to assist.   Equipment: No equipment provided  Reasons for discharge: treatment goals met and discharge from hospital  Patient/family agrees with progress made and goals achieved: Yes  OT Discharge Precautions/Restrictions  Precautions Precautions: Fall Precaution Comments: right hemiparesis Restrictions Weight Bearing Restrictions: No  Pain Pain Assessment Pain Scale: 0-10 Pain Score: 0-No pain ADL ADL Eating: Independent Where Assessed-Eating: Wheelchair Grooming: Supervision/safety Where Assessed-Grooming: Wheelchair Upper Body Bathing: Supervision/safety Where Assessed-Upper Body Bathing: Shower Lower Body Bathing: Supervision/safety Where Assessed-Lower Body Bathing: Shower Upper Body Dressing: Supervision/safety Where Assessed-Upper Body Dressing: Wheelchair Lower Body Dressing: Supervision/safety Where Assessed-Lower Body Dressing:  Wheelchair Toileting: Supervision/safety Where Assessed-Toileting: Medical laboratory scientific officer: Close Engineer, maintenance (IT) Method:  Counselling psychologist: Grab bars, Raised toilet seat Tub/Shower Equipment: Civil engineer, contracting with back Social research officer, government: Close supervision Social research officer, government Method: Stand pivot(anterior posterior transfer) Youth worker: Civil engineer, contracting with back Vision Baseline Vision/History: No visual deficits Wears Glasses: Reading only Patient Visual Report: No change from baseline;Other (comment) Vision Assessment?: No apparent visual deficits Visual Fields: No apparent deficits Perception  Perception: Within Functional Limits Praxis Praxis: Intact Cognition Overall Cognitive Status: Within Functional Limits for tasks assessed Arousal/Alertness: Awake/alert Orientation Level: Oriented X4 Attention: Sustained;Selective Focused Attention: Appears intact Sustained Attention: Appears intact Selective Attention: Appears intact Awareness: Impaired Awareness Impairment: Anticipatory impairment Sequencing: Appears intact Safety/Judgment: Impaired Comments: Pt with some decreased awareness noted with regards to activities he wants to be able to do at home, such as tend to his cats outside, even though this has been discussed in therapy and recommended that he have assist with it until his balance and strength improve more. Sensation Sensation Light Touch: Appears Intact Hot/Cold: Appears Intact Proprioception: Appears Intact Stereognosis: Not tested Coordination Gross Motor Movements are Fluid and Coordinated: No Fine Motor Movements are Fluid and Coordinated: No Coordination and Movement Description: RUE / R LE hemiplegia still present.  Brunnstrum stage III in the arm with stage IV in the hand.  He needs max hand over hand for integration into selfcare tasks as an active assist such as washing the LUE. Motor  Motor Motor: Hemiplegia Motor - Discharge Observations: Pt sitll with significant RUE and RLE hemiparesis Mobility  Transfers Sit to Stand:  Supervision/Verbal cueing Stand to Sit: Supervision/Verbal cueing  Trunk/Postural Assessment  Cervical Assessment Cervical Assessment: Within Functional Limits Thoracic Assessment Thoracic Assessment: Exceptions to WFL(thoracic rounding) Lumbar Assessment Lumbar Assessment: Exceptions to WFL(posterior pelvic tilt at rest) Postural Control Protective Responses: delayed  Balance Balance Balance Assessed: Yes Static Sitting Balance Static Sitting - Balance Support: Feet supported Static Sitting - Level of Assistance: 7: Independent Dynamic Sitting Balance Dynamic Sitting - Balance Support: During functional activity;No upper extremity supported Dynamic Sitting - Level of Assistance: 5: Stand by assistance Static Standing Balance Static Standing - Balance Support: During functional activity;Left upper extremity supported Static Standing - Level of Assistance: 5: Stand by assistance Dynamic Standing Balance Dynamic Standing - Balance Support: During functional activity Dynamic Standing - Level of Assistance: 5: Stand by assistance Extremity/Trunk Assessment RUE Assessment RUE Assessment: Exceptions to [facility] Passive Range of Motion (PROM) Comments: [facility] Strength Comments: Brunnstrum stage III movement in the right shoulder and elbow with Brunnstrum stage IV movement in the hand.  Gross grasp of 90% with extension approximately 35%.   Noted one finger anterior inferior subluxation present as well.  He is able to use the RUE as a gross assist for holding objects to be open with supervsion.  Max hand over hand assistance for use as an active assist for bathing or dressing. LUE Assessment LUE Assessment: Within Functional Limits   Cerria Randhawa OTR/L 01/17/2019, 12:19 PM

## 2019-01-18 LAB — GLUCOSE, CAPILLARY
Glucose-Capillary: 69 mg/dL — ABNORMAL LOW (ref 70–99)
Glucose-Capillary: 122 mg/dL — ABNORMAL HIGH (ref 70–99)
Glucose-Capillary: 98 mg/dL (ref 70–99)

## 2019-01-18 NOTE — Progress Notes (Signed)
Social Work Discharge Note   The overall goal for the admission was met for:   Discharge location: Yes - home with wife  Length of Stay: Yes - 24 days  Discharge activity level: Yes - supervision  Home/community participation: Yes  Services provided included: MD, RD, PT, OT, SLP, RN, Pharmacy, [place]: Private Insurance: [employer]  Follow-up services arranged: Outpatient: PT/OT/ST, DME: 20"x18" wheelchair with basic cushion; rolling walker and Patient/Family request agency [facility]: [facility], DME: [employer]  Comments (or additional information): Wife did not come in for family education and therapists talked with her, as needed over the phone.  Pt will be coordinating his own f/u.  Patient/Family verbalized understanding of follow-up arrangements: Yes  Individual responsible for coordination of the follow-up plan: pt  Confirmed correct DME delivered: Trey Sailors 01/18/2019    Saryna Kneeland, Silvestre Mesi

## 2019-01-18 NOTE — Progress Notes (Signed)
[place] PHYSICAL MEDICINE & REHABILITATION PROGRESS NOTE   Subjective/Complaints:  Patient feels ready to go home today.  ROS: Patient denies CP, SOB, N/V/D   Objective:   No results found. No results for input(s): WBC, HGB, HCT, PLT in the last 72 hours. No results for input(s): NA, K, CL, CO2, GLUCOSE, BUN, CREATININE, CALCIUM in the last 72 hours.  Intake/Output Summary (Last 24 hours) at 01/18/2019 1247 Last data filed at 01/18/2019 0506 Gross per 24 hour  Intake 240 ml  Output 850 ml  Net -610 ml     Physical Exam: Vital Signs Blood pressure 109/69, pulse (!) 40, temperature 97.6 F (36.4 C), resp. rate 18, height 5\' 11"  (1.803 m), weight 95.3 kg, SpO2 95 %.   Constitutional: No distress . Vital signs reviewed. HEENT: EOMI, oral membranes moist Neck: supple Cardiovascular: RRR without murmur. No JVD    Respiratory: CTA Bilaterally without wheezes or rales. Normal effort    GI: BS +, non-tender, non-distended  Extremities: No clubbing, cyanosis, or edema Skin: No evidence of breakdown, no evidence of rash Neurologic: good insight and awareness.motor strength is 5/5 in LEFT  deltoid, bicep, tricep, grip, hip flexor, knee extensors, ankle dorsiflexor and plantar flexor 3- RIght biceps and finger flexors,3-  sup/pron, 2- thumb ext  forearm  3- Left hip flexion and 3/5 hip /knee ext synergy, 0 at ankle--no real change   Musculoskeletal: Full range of motion in all 4 extremities. No edema Psych:no lability or agitation    Assessment/Plan: 1. 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  Stable for D/C today F/u PCP in 3-4 weeks F/u PM&R 2 weeks See D/C summary See D/C instructions Care Tool:  Bathing  Bathing activity did not occur: Refused Body parts bathed by patient: Right arm, Chest, Abdomen, Front perineal area, Buttocks, Right upper leg, Left upper leg, Right lower leg, Left lower leg, Face, Left arm   Body parts bathed by helper:  Left arm     Bathing assist Assist Level: Supervision/Verbal cueing     Upper Body Dressing/Undressing Upper body dressing   What is the patient wearing?: Pull over shirt    Upper body assist Assist Level: Supervision/Verbal cueing    Lower Body Dressing/Undressing Lower body dressing      What is the patient wearing?: Pants     Lower body assist Assist for lower body dressing: Supervision/Verbal cueing     Toileting Toileting Toileting Activity did not occur (Clothing management and hygiene only): Refused  Toileting assist Assist for toileting: Supervision/Verbal cueing     Transfers Chair/bed transfer  Transfers assist  Chair/bed transfer activity did not occur: Safety/medical concerns  Chair/bed transfer assist level: Supervision/Verbal cueing     Locomotion Ambulation   Ambulation assist   Ambulation activity did not occur: Safety/medical concerns  Assist level: Supervision/Verbal cueing Assistive device: Orthosis Max distance: 150   Walk 10 feet activity   Assist  Walk 10 feet activity did not occur: Safety/medical concerns  Assist level: Supervision/Verbal cueing Assistive device: Walker-rolling   Walk 50 feet activity   Assist Walk 50 feet with 2 turns activity did not occur: Safety/medical concerns  Assist level: Supervision/Verbal cueing Assistive device: Walker-rolling    Walk 150 feet activity   Assist Walk 150 feet activity did not occur: Safety/medical concerns  Assist level: Supervision/Verbal cueing Assistive device: Walker-rolling    Walk 10 feet on uneven surface  activity   Assist Walk 10 feet on uneven surfaces activity did not occur:  Safety/medical concerns   Assist level: Contact Guard/Touching assist Assistive device: Aeronautical engineer Will patient use wheelchair at discharge?: Yes Type of Wheelchair: Manual    Wheelchair assist level: Independent Max wheelchair distance: 200     Wheelchair 50 feet with 2 turns activity    Assist        Assist Level: Independent   Wheelchair 150 feet activity     Assist     Assist Level: Independent    Medical Problem List and Plan: 1.Right side weakness with facial droop and dysarthriasecondary to 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 Status post TPA. Patient refused loop recorder CIR PT, OT,  SLP, discharge home today with wife 2. Antithrombotics: -DVT/anticoagulation:SCDs -antiplatelet therapy: Aspirin 81 mg daily 3. Pain Management:Tylenol as needed 4. Mood:Provide emotional support -antipsychotic agents: N/A 5. Neuropsych: This patientiscapable of making decisions on hisown behalf. 6. Skin/Wound Care:Routine skin checks 7. Fluids/Electrolytes/Nutrition:Routine in and outs with follow-up chemistries -encourage PO 8.Diabetes mellitus. Hemoglobin A1c 13.3.Lantus insulin 30units daily. Meal coverage has been discontinued CBG (last 3)  Recent Labs    01/18/19 0612 01/18/19 0728 01/18/19 1145  GLUCAP 69* 122* 98  Mildly low in a.m. will likely have a more liberalized diet at home we will not make any changes prior to discharge -diabetic education 9. BP  controlled no med Vitals:   01/17/19 2005 01/18/19 0506  BP: 111/64 109/69  Pulse: (!) 51 (!) 40  Resp: 19 18  Temp: 98.6 F (37 C) 97.6 F (36.4 C)  SpO2: 97% 95%  Controlled 6/26  10.Medical noncompliance. Counseling 11.  Constipation improved,   Moved bowels on 6/20  LOS: 25 days A FACE TO FACE EVALUATION WAS PERFORMED  [name] 01/18/2019, 12:47 PM

## 2019-01-18 NOTE — Progress Notes (Signed)
Social Work Patient ID: Robert Brewer, male   DOB: Jul 27, 1945, 73 y.o.   MRN: [id number]   CSW met with pt to update him on team conference and to confirm d/c plans.  Pt is ready to go and is glad to be going for outpt rehab.  CSW gave him signed handicapped placard application.  Pt has no other needs at this time, but CSW remains available to assist as needed.

## 2019-01-22 ENCOUNTER — Telehealth: Payer: Self-pay

## 2019-01-22 NOTE — Telephone Encounter (Signed)
Patient recently discharged from rehab floor, was given insulin solostar medication but not given the pen needles to go with the pins.  Can he be prescribed these needles please.  PCP will not be able to until he sees them on 01-24-2019 when they will be able to take over the insulin prescriptions.

## 2019-01-22 NOTE — Telephone Encounter (Signed)
Transitional Care Questions   Questions for our staff to ask patients on Transitional care 48 hour phone call:   1. Are you/is patient experiencing any problems since coming home? Are there any questions regarding any aspect of care?  States has all over pain but tylenol helps.  2. Are there any questions regarding medications administration/dosing? Are meds being taken as prescribed? Patient should review meds with caller to confirm : did not receive pen needles for the solostar insulin pens that he recieved at discharge.  Contacted potential primary care for him and was informed that until he has an appointment with them scheduled on 01-24-2019 that they will be unable to prescribe the pen needles.  Sent this message to discharging provider.  3. Have there been any falls? No  4. Has Home Health been to the house and/or have they contacted you? If not, have you tried to contact them? Can we help you contact them? No, Outpatient therapies, already have appointments.  5. Are bowels and bladder emptying properly? Are there any unexpected incontinence issues? If applicable, is patient following bowel/bladder programs? No  6. Any fevers, problems with breathing, unexpected pain? No  7. Are there any skin problems or new areas of breakdown? No  8. Has the patient/family member arranged specialty MD follow up (ie cardiology/neurology/renal/surgical/etc)? Can we help arrange? Yes / no  9. Does the patient need any other services or support that we can help arrange? No  10. Are caregivers following through as expected in assisting the patient? Yes  11. Has the patient quit smoking, drinking alcohol, or using drugs as recommended? Na

## 2019-01-24 DIAGNOSIS — E118 Type 2 diabetes mellitus with unspecified complications: Secondary | ICD-10-CM | POA: Diagnosis not present

## 2019-01-24 DIAGNOSIS — Z8673 Personal history of transient ischemic attack (TIA), and cerebral infarction without residual deficits: Secondary | ICD-10-CM | POA: Diagnosis not present

## 2019-01-24 DIAGNOSIS — E663 Overweight: Secondary | ICD-10-CM | POA: Diagnosis not present

## 2019-01-24 DIAGNOSIS — I10 Essential (primary) hypertension: Secondary | ICD-10-CM | POA: Diagnosis not present

## 2019-01-24 DIAGNOSIS — Z6829 Body mass index (BMI) 29.0-29.9, adult: Secondary | ICD-10-CM | POA: Diagnosis not present

## 2019-01-24 DIAGNOSIS — R5382 Chronic fatigue, unspecified: Secondary | ICD-10-CM | POA: Diagnosis not present

## 2019-01-24 DIAGNOSIS — K5909 Other constipation: Secondary | ICD-10-CM | POA: Diagnosis not present

## 2019-01-24 DIAGNOSIS — E7800 Pure hypercholesterolemia, unspecified: Secondary | ICD-10-CM | POA: Diagnosis not present

## 2019-01-28 DIAGNOSIS — I69351 Hemiplegia and hemiparesis following cerebral infarction affecting right dominant side: Secondary | ICD-10-CM | POA: Diagnosis not present

## 2019-01-28 DIAGNOSIS — I69322 Dysarthria following cerebral infarction: Secondary | ICD-10-CM | POA: Diagnosis not present

## 2019-01-28 DIAGNOSIS — R2681 Unsteadiness on feet: Secondary | ICD-10-CM | POA: Diagnosis not present

## 2019-01-28 DIAGNOSIS — I69392 Facial weakness following cerebral infarction: Secondary | ICD-10-CM | POA: Diagnosis not present

## 2019-01-28 DIAGNOSIS — M6281 Muscle weakness (generalized): Secondary | ICD-10-CM | POA: Diagnosis not present

## 2019-01-28 DIAGNOSIS — R2689 Other abnormalities of gait and mobility: Secondary | ICD-10-CM | POA: Diagnosis not present

## 2019-01-28 DIAGNOSIS — R278 Other lack of coordination: Secondary | ICD-10-CM | POA: Diagnosis not present

## 2019-01-29 ENCOUNTER — Encounter: Payer: Medicare HMO | Admitting: Registered Nurse

## 2019-01-30 ENCOUNTER — Telehealth: Payer: Self-pay | Admitting: *Deleted

## 2019-01-30 NOTE — Telephone Encounter (Signed)
I left a voicemail for Robert Brewer that we have resent the referral for his diabetic management to Dr Thayer Ohm office. I think it was lacking his diabetic diagnosis so hey thought it was stroke management.

## 2019-02-01 DIAGNOSIS — Z8673 Personal history of transient ischemic attack (TIA), and cerebral infarction without residual deficits: Secondary | ICD-10-CM | POA: Diagnosis not present

## 2019-02-01 DIAGNOSIS — I10 Essential (primary) hypertension: Secondary | ICD-10-CM | POA: Diagnosis not present

## 2019-02-01 DIAGNOSIS — Z6829 Body mass index (BMI) 29.0-29.9, adult: Secondary | ICD-10-CM | POA: Diagnosis not present

## 2019-02-01 DIAGNOSIS — E7800 Pure hypercholesterolemia, unspecified: Secondary | ICD-10-CM | POA: Diagnosis not present

## 2019-02-01 DIAGNOSIS — E118 Type 2 diabetes mellitus with unspecified complications: Secondary | ICD-10-CM | POA: Diagnosis not present

## 2019-02-01 DIAGNOSIS — K5909 Other constipation: Secondary | ICD-10-CM | POA: Diagnosis not present

## 2019-02-01 DIAGNOSIS — E663 Overweight: Secondary | ICD-10-CM | POA: Diagnosis not present

## 2019-02-01 DIAGNOSIS — H6123 Impacted cerumen, bilateral: Secondary | ICD-10-CM | POA: Diagnosis not present

## 2019-02-05 DIAGNOSIS — I69392 Facial weakness following cerebral infarction: Secondary | ICD-10-CM | POA: Diagnosis not present

## 2019-02-05 DIAGNOSIS — R2681 Unsteadiness on feet: Secondary | ICD-10-CM | POA: Diagnosis not present

## 2019-02-05 DIAGNOSIS — I10 Essential (primary) hypertension: Secondary | ICD-10-CM | POA: Diagnosis not present

## 2019-02-05 DIAGNOSIS — M6281 Muscle weakness (generalized): Secondary | ICD-10-CM | POA: Diagnosis not present

## 2019-02-05 DIAGNOSIS — I69322 Dysarthria following cerebral infarction: Secondary | ICD-10-CM | POA: Diagnosis not present

## 2019-02-05 DIAGNOSIS — I69351 Hemiplegia and hemiparesis following cerebral infarction affecting right dominant side: Secondary | ICD-10-CM | POA: Diagnosis not present

## 2019-02-05 DIAGNOSIS — Z8673 Personal history of transient ischemic attack (TIA), and cerebral infarction without residual deficits: Secondary | ICD-10-CM | POA: Diagnosis not present

## 2019-02-05 DIAGNOSIS — E663 Overweight: Secondary | ICD-10-CM | POA: Diagnosis not present

## 2019-02-05 DIAGNOSIS — R278 Other lack of coordination: Secondary | ICD-10-CM | POA: Diagnosis not present

## 2019-02-05 DIAGNOSIS — I6930 Unspecified sequelae of cerebral infarction: Secondary | ICD-10-CM | POA: Diagnosis not present

## 2019-02-05 DIAGNOSIS — K5909 Other constipation: Secondary | ICD-10-CM | POA: Diagnosis not present

## 2019-02-05 DIAGNOSIS — Z6829 Body mass index (BMI) 29.0-29.9, adult: Secondary | ICD-10-CM | POA: Diagnosis not present

## 2019-02-05 DIAGNOSIS — R600 Localized edema: Secondary | ICD-10-CM | POA: Diagnosis not present

## 2019-02-05 DIAGNOSIS — E118 Type 2 diabetes mellitus with unspecified complications: Secondary | ICD-10-CM | POA: Diagnosis not present

## 2019-02-05 DIAGNOSIS — E7800 Pure hypercholesterolemia, unspecified: Secondary | ICD-10-CM | POA: Diagnosis not present

## 2019-02-05 DIAGNOSIS — R2689 Other abnormalities of gait and mobility: Secondary | ICD-10-CM | POA: Diagnosis not present

## 2019-02-06 ENCOUNTER — Encounter: Payer: Self-pay | Admitting: Registered Nurse

## 2019-02-06 ENCOUNTER — Encounter: Payer: Medicare HMO | Attending: Registered Nurse | Admitting: Registered Nurse

## 2019-02-06 ENCOUNTER — Other Ambulatory Visit: Payer: Self-pay

## 2019-02-06 VITALS — BP 138/77 | HR 58 | Temp 97.7°F | Ht 71.0 in | Wt 204.0 lb

## 2019-02-06 DIAGNOSIS — G8191 Hemiplegia, unspecified affecting right dominant side: Secondary | ICD-10-CM

## 2019-02-06 DIAGNOSIS — I639 Cerebral infarction, unspecified: Secondary | ICD-10-CM

## 2019-02-06 DIAGNOSIS — F329 Major depressive disorder, single episode, unspecified: Secondary | ICD-10-CM | POA: Insufficient documentation

## 2019-02-06 DIAGNOSIS — I6350 Cerebral infarction due to unspecified occlusion or stenosis of unspecified cerebral artery: Secondary | ICD-10-CM | POA: Insufficient documentation

## 2019-02-06 NOTE — Progress Notes (Signed)
Subjective:    Patient ID: Robert Brewer, male    DOB: [birth year], 73 y.o.   MRN: [id number]  YIA:[id number] A Hur is a 73 y.o. male who is here for transitional care visit in follow up of his left pontine CVA, right hemiparesis, essential hypertension and reactive depression. He presented to [facility] on 12/17/2018, with right sided weakness and facial droop. Robert Brewer was mowing his lawn when his symptoms started and his wife called 104. He received IV TPA, he was transferred to [facility]. Neurology was consulted.  CT Angio Head W or WO Contrast: IMPRESSION: CT HEAD IMPRESSION:  1. Subtle approximate 1 cm hypodensity involving the left paramedian pons, increased in prominence from previous, and suspicious for evolving acute ischemic infarct. 2. Otherwise stable appearance of the head with associated age-related cerebral atrophy. No other acute abnormality identified.  CTA HEAD AND NECK IMPRESSION:  1. 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 No significant intraluminal thrombus or raised dissection flap identified. 2. No large vessel occlusion. No other hemodynamically significant or correctable stenosis.  MR Brain WO Contrast: IMPRESSION: Acute infarct left paramedian pons. Small acute cortical infarct left occipital lobe. Negative for hemorrhage. He was maintained on ASA for CVA prophylaxis. Robert Brewer refused loop recorder.   Robert Brewer was admitted to inpatient rehabilitation on 12/24/2018 and discharged home on 01/18/2019. He is receiving outpatient therapy ar [facility].. Robert Brewer denies any pain. He did not rate his pain. Also reports he has a good appetite.  Spent time educating Robert Brewer on no driving until cleared by Dr. Letta Pate or Neurology, he verbalizes understanding.   Pain  Inventory Average Pain 2 Pain Right Now n/a My pain is aching  In the last 24 hours, has pain interfered with the following? General activity 2 Relation with others 2 Enjoyment of life 4 What TIME of day is your pain at its worst? evening, night Sleep (in general) Fair  Pain is worse with: bending, sitting and some activites Pain improves with: rest and pacing activities Relief from Meds:   Mobility use a walker ability to climb steps?  no do you drive?  no  Function not employed: date last employed 2013  Neuro/Psych weakness trouble walking confusion loss of taste or smell  Prior Studies no  Physicians involved in your care no   Family History  Problem Relation Age of Onset  . Other Mother        healthy, lived to her 55's  . Prostate cancer Father        lived to his 89's  . Congestive Heart Failure Father   . Valvular heart disease Sister   . Prostate cancer Brother    Social History   Socioeconomic History  . Marital status: Married    Spouse name: Not on file  . Number of children: Not on file  . Years of education: Not on file  . Highest education level: Not on file  Occupational History  . Not on file  Social Needs  . Financial resource strain: Not on file  . Food insecurity    Worry: Not on file    Inability: Not on file  . Transportation needs    Medical: Not on file    Non-medical: Not on file  Tobacco Use  . Smoking status: Never Smoker  . Smokeless tobacco: Never Used  Substance and Sexual  Activity  . Alcohol use: Never    Frequency: Never  . Drug use: Not on file  . Sexual activity: Not on file  Lifestyle  . Physical activity    Days per week: Not on file    Minutes per session: Not on file  . Stress: Not on file  Relationships  . Social Herbalist on phone: Not on file    Gets together: Not on file    Attends religious service: Not on file    Active member of club or organization: Not on file    Attends  meetings of clubs or organizations: Not on file    Relationship status: Not on file  Other Topics Concern  . Not on file  Social History Narrative  . Not on file   No past surgical history on file. No past medical history on file. BP 138/77   Pulse (!) 58   Ht 5\' 11"  (1.803 m)   Wt 204 lb (92.5 kg)   SpO2 96%   BMI 28.45 kg/m   Opioid Risk Score:   Fall Risk Score:  `1  Depression screen PHQ 2/9  No flowsheet data found.    Review of Systems     Objective:   Physical Exam Vitals signs and nursing note reviewed.  Constitutional:      Appearance: Normal appearance.  Neck:     Musculoskeletal: Normal range of motion and neck supple.  Cardiovascular:     Rate and Rhythm: Normal rate and regular rhythm.     Pulses: Normal pulses.     Heart sounds: Normal heart sounds.  Pulmonary:     Effort: Pulmonary effort is normal.     Breath sounds: Normal breath sounds.  Musculoskeletal:     Comments: Normal Muscle Bulk and Muscle Testing Reveals:  Upper Extremities: Right: Decreased ROM  45 Degrees and Muscle Strength 1/5 and Left: Full ROM and Muscle Strength 5/5  Lower Extremities: Right: Decreased ROM and Muscle Strength 4/5 Left: Full ROM and Muscle Strength 5/5 Arises from Table with ease using walker for support Narrow Based Gait   Skin:    General: Skin is warm and dry.  Neurological:     Mental Status: He is alert and oriented to person, place, and time.  Psychiatric:        Mood and Affect: Mood normal.        Behavior: Behavior normal.           Assessment & Plan:  1. Left Pontine CVA/ Right Hemiparesis: Continue Outpatient Therapy. Has HFU appointment scheduled for Neurology.  2.Reactive Depression: Continue to Monitor. PCP Following.   20 minutes of face to face patient care time was spent during this visit. All questions were encouraged and answered.

## 2019-02-07 DIAGNOSIS — I69392 Facial weakness following cerebral infarction: Secondary | ICD-10-CM | POA: Diagnosis not present

## 2019-02-07 DIAGNOSIS — R2689 Other abnormalities of gait and mobility: Secondary | ICD-10-CM | POA: Diagnosis not present

## 2019-02-07 DIAGNOSIS — M6281 Muscle weakness (generalized): Secondary | ICD-10-CM | POA: Diagnosis not present

## 2019-02-07 DIAGNOSIS — I69351 Hemiplegia and hemiparesis following cerebral infarction affecting right dominant side: Secondary | ICD-10-CM | POA: Diagnosis not present

## 2019-02-07 DIAGNOSIS — R278 Other lack of coordination: Secondary | ICD-10-CM | POA: Diagnosis not present

## 2019-02-07 DIAGNOSIS — I69322 Dysarthria following cerebral infarction: Secondary | ICD-10-CM | POA: Diagnosis not present

## 2019-02-07 DIAGNOSIS — R2681 Unsteadiness on feet: Secondary | ICD-10-CM | POA: Diagnosis not present

## 2019-02-08 DIAGNOSIS — R600 Localized edema: Secondary | ICD-10-CM | POA: Diagnosis not present

## 2019-02-12 DIAGNOSIS — I69322 Dysarthria following cerebral infarction: Secondary | ICD-10-CM | POA: Diagnosis not present

## 2019-02-12 DIAGNOSIS — R2689 Other abnormalities of gait and mobility: Secondary | ICD-10-CM | POA: Diagnosis not present

## 2019-02-12 DIAGNOSIS — R2681 Unsteadiness on feet: Secondary | ICD-10-CM | POA: Diagnosis not present

## 2019-02-12 DIAGNOSIS — I69392 Facial weakness following cerebral infarction: Secondary | ICD-10-CM | POA: Diagnosis not present

## 2019-02-12 DIAGNOSIS — M6281 Muscle weakness (generalized): Secondary | ICD-10-CM | POA: Diagnosis not present

## 2019-02-12 DIAGNOSIS — R278 Other lack of coordination: Secondary | ICD-10-CM | POA: Diagnosis not present

## 2019-02-12 DIAGNOSIS — I69351 Hemiplegia and hemiparesis following cerebral infarction affecting right dominant side: Secondary | ICD-10-CM | POA: Diagnosis not present

## 2019-02-14 DIAGNOSIS — I69351 Hemiplegia and hemiparesis following cerebral infarction affecting right dominant side: Secondary | ICD-10-CM | POA: Diagnosis not present

## 2019-02-14 DIAGNOSIS — M6281 Muscle weakness (generalized): Secondary | ICD-10-CM | POA: Diagnosis not present

## 2019-02-14 DIAGNOSIS — R2681 Unsteadiness on feet: Secondary | ICD-10-CM | POA: Diagnosis not present

## 2019-02-14 DIAGNOSIS — R278 Other lack of coordination: Secondary | ICD-10-CM | POA: Diagnosis not present

## 2019-02-14 DIAGNOSIS — I69322 Dysarthria following cerebral infarction: Secondary | ICD-10-CM | POA: Diagnosis not present

## 2019-02-14 DIAGNOSIS — I69392 Facial weakness following cerebral infarction: Secondary | ICD-10-CM | POA: Diagnosis not present

## 2019-02-14 DIAGNOSIS — R2689 Other abnormalities of gait and mobility: Secondary | ICD-10-CM | POA: Diagnosis not present

## 2019-02-15 DIAGNOSIS — E118 Type 2 diabetes mellitus with unspecified complications: Secondary | ICD-10-CM | POA: Diagnosis not present

## 2019-02-15 DIAGNOSIS — H6121 Impacted cerumen, right ear: Secondary | ICD-10-CM | POA: Diagnosis not present

## 2019-02-15 DIAGNOSIS — K5909 Other constipation: Secondary | ICD-10-CM | POA: Diagnosis not present

## 2019-02-15 DIAGNOSIS — Z8673 Personal history of transient ischemic attack (TIA), and cerebral infarction without residual deficits: Secondary | ICD-10-CM | POA: Diagnosis not present

## 2019-02-15 DIAGNOSIS — I10 Essential (primary) hypertension: Secondary | ICD-10-CM | POA: Diagnosis not present

## 2019-02-15 DIAGNOSIS — Z6828 Body mass index (BMI) 28.0-28.9, adult: Secondary | ICD-10-CM | POA: Diagnosis not present

## 2019-02-15 DIAGNOSIS — E663 Overweight: Secondary | ICD-10-CM | POA: Diagnosis not present

## 2019-02-15 DIAGNOSIS — I6930 Unspecified sequelae of cerebral infarction: Secondary | ICD-10-CM | POA: Diagnosis not present

## 2019-02-15 DIAGNOSIS — E7800 Pure hypercholesterolemia, unspecified: Secondary | ICD-10-CM | POA: Diagnosis not present

## 2019-02-16 DIAGNOSIS — I6350 Cerebral infarction due to unspecified occlusion or stenosis of unspecified cerebral artery: Secondary | ICD-10-CM | POA: Diagnosis not present

## 2019-02-19 DIAGNOSIS — R278 Other lack of coordination: Secondary | ICD-10-CM | POA: Diagnosis not present

## 2019-02-19 DIAGNOSIS — I69351 Hemiplegia and hemiparesis following cerebral infarction affecting right dominant side: Secondary | ICD-10-CM | POA: Diagnosis not present

## 2019-02-19 DIAGNOSIS — R2681 Unsteadiness on feet: Secondary | ICD-10-CM | POA: Diagnosis not present

## 2019-02-19 DIAGNOSIS — R2689 Other abnormalities of gait and mobility: Secondary | ICD-10-CM | POA: Diagnosis not present

## 2019-02-19 DIAGNOSIS — M6281 Muscle weakness (generalized): Secondary | ICD-10-CM | POA: Diagnosis not present

## 2019-02-19 DIAGNOSIS — I69322 Dysarthria following cerebral infarction: Secondary | ICD-10-CM | POA: Diagnosis not present

## 2019-02-19 DIAGNOSIS — I69392 Facial weakness following cerebral infarction: Secondary | ICD-10-CM | POA: Diagnosis not present

## 2019-02-21 DIAGNOSIS — M6281 Muscle weakness (generalized): Secondary | ICD-10-CM | POA: Diagnosis not present

## 2019-02-21 DIAGNOSIS — R2689 Other abnormalities of gait and mobility: Secondary | ICD-10-CM | POA: Diagnosis not present

## 2019-02-21 DIAGNOSIS — I69351 Hemiplegia and hemiparesis following cerebral infarction affecting right dominant side: Secondary | ICD-10-CM | POA: Diagnosis not present

## 2019-02-21 DIAGNOSIS — R278 Other lack of coordination: Secondary | ICD-10-CM | POA: Diagnosis not present

## 2019-02-21 DIAGNOSIS — R2681 Unsteadiness on feet: Secondary | ICD-10-CM | POA: Diagnosis not present

## 2019-02-21 DIAGNOSIS — I69392 Facial weakness following cerebral infarction: Secondary | ICD-10-CM | POA: Diagnosis not present

## 2019-02-21 DIAGNOSIS — I69322 Dysarthria following cerebral infarction: Secondary | ICD-10-CM | POA: Diagnosis not present

## 2019-02-26 DIAGNOSIS — I69322 Dysarthria following cerebral infarction: Secondary | ICD-10-CM | POA: Diagnosis not present

## 2019-02-26 DIAGNOSIS — R2681 Unsteadiness on feet: Secondary | ICD-10-CM | POA: Diagnosis not present

## 2019-02-26 DIAGNOSIS — I69392 Facial weakness following cerebral infarction: Secondary | ICD-10-CM | POA: Diagnosis not present

## 2019-02-26 DIAGNOSIS — R2689 Other abnormalities of gait and mobility: Secondary | ICD-10-CM | POA: Diagnosis not present

## 2019-02-26 DIAGNOSIS — I69351 Hemiplegia and hemiparesis following cerebral infarction affecting right dominant side: Secondary | ICD-10-CM | POA: Diagnosis not present

## 2019-02-26 DIAGNOSIS — M6281 Muscle weakness (generalized): Secondary | ICD-10-CM | POA: Diagnosis not present

## 2019-02-28 DIAGNOSIS — R2681 Unsteadiness on feet: Secondary | ICD-10-CM | POA: Diagnosis not present

## 2019-02-28 DIAGNOSIS — I69322 Dysarthria following cerebral infarction: Secondary | ICD-10-CM | POA: Diagnosis not present

## 2019-02-28 DIAGNOSIS — I69392 Facial weakness following cerebral infarction: Secondary | ICD-10-CM | POA: Diagnosis not present

## 2019-02-28 DIAGNOSIS — M6281 Muscle weakness (generalized): Secondary | ICD-10-CM | POA: Diagnosis not present

## 2019-02-28 DIAGNOSIS — R2689 Other abnormalities of gait and mobility: Secondary | ICD-10-CM | POA: Diagnosis not present

## 2019-02-28 DIAGNOSIS — I69351 Hemiplegia and hemiparesis following cerebral infarction affecting right dominant side: Secondary | ICD-10-CM | POA: Diagnosis not present

## 2019-03-05 DIAGNOSIS — I69322 Dysarthria following cerebral infarction: Secondary | ICD-10-CM | POA: Diagnosis not present

## 2019-03-05 DIAGNOSIS — R2681 Unsteadiness on feet: Secondary | ICD-10-CM | POA: Diagnosis not present

## 2019-03-05 DIAGNOSIS — I69351 Hemiplegia and hemiparesis following cerebral infarction affecting right dominant side: Secondary | ICD-10-CM | POA: Diagnosis not present

## 2019-03-05 DIAGNOSIS — R2689 Other abnormalities of gait and mobility: Secondary | ICD-10-CM | POA: Diagnosis not present

## 2019-03-05 DIAGNOSIS — I69392 Facial weakness following cerebral infarction: Secondary | ICD-10-CM | POA: Diagnosis not present

## 2019-03-05 DIAGNOSIS — M6281 Muscle weakness (generalized): Secondary | ICD-10-CM | POA: Diagnosis not present

## 2019-03-07 DIAGNOSIS — I69322 Dysarthria following cerebral infarction: Secondary | ICD-10-CM | POA: Diagnosis not present

## 2019-03-07 DIAGNOSIS — R2681 Unsteadiness on feet: Secondary | ICD-10-CM | POA: Diagnosis not present

## 2019-03-07 DIAGNOSIS — I69351 Hemiplegia and hemiparesis following cerebral infarction affecting right dominant side: Secondary | ICD-10-CM | POA: Diagnosis not present

## 2019-03-07 DIAGNOSIS — I69392 Facial weakness following cerebral infarction: Secondary | ICD-10-CM | POA: Diagnosis not present

## 2019-03-07 DIAGNOSIS — R2689 Other abnormalities of gait and mobility: Secondary | ICD-10-CM | POA: Diagnosis not present

## 2019-03-07 DIAGNOSIS — M6281 Muscle weakness (generalized): Secondary | ICD-10-CM | POA: Diagnosis not present

## 2019-03-12 DIAGNOSIS — M6281 Muscle weakness (generalized): Secondary | ICD-10-CM | POA: Diagnosis not present

## 2019-03-12 DIAGNOSIS — R2681 Unsteadiness on feet: Secondary | ICD-10-CM | POA: Diagnosis not present

## 2019-03-12 DIAGNOSIS — I69322 Dysarthria following cerebral infarction: Secondary | ICD-10-CM | POA: Diagnosis not present

## 2019-03-12 DIAGNOSIS — I69392 Facial weakness following cerebral infarction: Secondary | ICD-10-CM | POA: Diagnosis not present

## 2019-03-12 DIAGNOSIS — R2689 Other abnormalities of gait and mobility: Secondary | ICD-10-CM | POA: Diagnosis not present

## 2019-03-12 DIAGNOSIS — I69351 Hemiplegia and hemiparesis following cerebral infarction affecting right dominant side: Secondary | ICD-10-CM | POA: Diagnosis not present

## 2019-03-13 ENCOUNTER — Ambulatory Visit (INDEPENDENT_AMBULATORY_CARE_PROVIDER_SITE_OTHER): Payer: Medicare HMO | Admitting: Adult Health

## 2019-03-13 ENCOUNTER — Other Ambulatory Visit: Payer: Self-pay

## 2019-03-13 ENCOUNTER — Encounter: Payer: Self-pay | Admitting: Adult Health

## 2019-03-13 VITALS — BP 121/68 | HR 65 | Temp 98.4°F | Ht 71.0 in | Wt 204.0 lb

## 2019-03-13 DIAGNOSIS — R471 Dysarthria and anarthria: Secondary | ICD-10-CM | POA: Diagnosis not present

## 2019-03-13 DIAGNOSIS — E785 Hyperlipidemia, unspecified: Secondary | ICD-10-CM | POA: Diagnosis not present

## 2019-03-13 DIAGNOSIS — I10 Essential (primary) hypertension: Secondary | ICD-10-CM | POA: Diagnosis not present

## 2019-03-13 DIAGNOSIS — F329 Major depressive disorder, single episode, unspecified: Secondary | ICD-10-CM

## 2019-03-13 DIAGNOSIS — I6330 Cerebral infarction due to thrombosis of unspecified cerebral artery: Secondary | ICD-10-CM | POA: Diagnosis not present

## 2019-03-13 DIAGNOSIS — G8191 Hemiplegia, unspecified affecting right dominant side: Secondary | ICD-10-CM

## 2019-03-13 DIAGNOSIS — E1165 Type 2 diabetes mellitus with hyperglycemia: Secondary | ICD-10-CM

## 2019-03-13 NOTE — Patient Instructions (Signed)
Continue participation in outpatient therapies with physical and Occupational Therapy.  Consider participation in speech therapy and if interested, please call our office and order will be placed  Consider undergoing 30-day cardiac event monitor to assess for potential atrial fibrillation with irregular heart rhythm rate at today's visit which could be the potential reason of your stroke  Continue aspirin 81 mg daily  for secondary stroke prevention  Continue to follow up with PCP regarding cholesterol, blood pressure and diabetes management   Continue to monitor blood pressure at home  Maintain strict control of hypertension with blood pressure goal below 130/90, diabetes with hemoglobin A1c goal below 6.5% and cholesterol with LDL cholesterol (bad cholesterol) goal below 70 mg/dL. I also advised the patient to eat a healthy diet with plenty of whole grains, cereals, fruits and vegetables, exercise regularly and maintain ideal body weight.  Followup in the future with me in 3 months or call earlier if needed       Thank you for coming to see [place] at [facility]. I hope we have been able to provide you high quality care today.  You may receive a patient satisfaction survey over the next few weeks. We would appreciate your feedback and comments so that we may continue to improve ourselves and the health of our patients.

## 2019-03-13 NOTE — Progress Notes (Signed)
[facility] [address] [place]. [place] [address] [phone]       HOSPITAL FOLLOW UP NOTE  Mr. MICHAH MINTON Date of Birth:  Nov 30, 1945 Medical Record Number:  [id number]   Reason for Referral:  hospital stroke follow up    CHIEF COMPLAINT:  Chief Complaint  Patient presents with   Hospitalization Follow-up    Stroke f/u. Wife present. Rm 9. No new concerns at this time.     HPI: Elmore A Allenis being seen today for in office hospital follow-up regarding left paramedian and left occipital infarct on 12/17/2018.  History obtained from patient, wife and chart review. Reviewed all radiology images and labs personally.  Mr.Breydon A Allenis a 73 y.o.malewith history of diabetes mellitus, HTN, not compliant with medications who presented on 12/17/2018 with right sided weakness and facial droop. tPA Given at [facility] transferred to [facility].  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  He declined further cardioembolic work-up.  2D echo unremarkable.  Initiated aspirin 81 mg daily.  Did not recommend DAPT as he is resistant to Western medicine.  HTN stable.  LDL 166 but patient refused statin.  A1c 13.3 and recommended follow-up with PCP for uncontrolled DM.  Other stroke risk factors include advanced age but no prior history of stroke.  Other active problems include noncompliance with medications with resistance to Western medicine.  Residual deficits mild dysarthria, right lower facial weakness, and right hemiparesis therefore discharged to CIR for ongoing therapy.  He is being seen today for hospital follow-up accompanied by his wife.  Residual deficits of right hemiparesis, right facial droop, dysarthria and short-term memory concerns.  He continues to work with  physical and occupational therapies at [facility] with ongoing improvement.  Continues to ambulate with rolling walker and unfortunately has had a few falls but no serious injury.  Wife endorses occasional difficulty with swallowing as he will attempt to put too much food in his mouth at once which can cause coughing or choking.  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 he has not been followed by speech therapy.  He continues on aspirin 81 mg without bleeding or bruising.  Blood pressure today 121/68.  No further concerns at this time.     ROS:   14 system review of systems performed and negative with exception of fatigue, swelling in legs, hearing loss, trouble swallowing, cough, snoring, feeling cold, joint swelling, cramps, muscle aches, runny nose, memory loss, numbness, weakness, slurred speech, difficulty swallowing, sleepiness, snoring, depression, too much sleep and decreased energy  PMH: History reviewed. No pertinent past medical history.  PSH: History reviewed. No pertinent surgical history.  Social History:  Social History   Socioeconomic History   Marital status: Married    Spouse name: Not on file   Number of children: Not on file   Years of education: Not on file   Highest education level: Not on file  Occupational History   Not on file  Social Needs   Financial resource strain: Not on file   Food insecurity    Worry: Not on file    Inability: Not on file   Transportation needs    Medical: Not on file    Non-medical: Not on file  Tobacco Use  Smoking status: Never Smoker   Smokeless tobacco: Never Used  Substance and Sexual Activity   Alcohol use: Never    Frequency: Never   Drug use: Not on file   Sexual activity: Not on file  Lifestyle   Physical activity    Days per week: Not on file    Minutes per session: Not on  file   Stress: Not on file  Relationships   Social connections    Talks on phone: Not on file    Gets together: Not on file    Attends religious service: Not on file    Active member of club or organization: Not on file    Attends meetings of clubs or organizations: Not on file    Relationship status: Not on file   Intimate partner violence    Fear of current or ex partner: Not on file    Emotionally abused: Not on file    Physically abused: Not on file    Forced sexual activity: Not on file  Other Topics Concern   Not on file  Social History Narrative   Not on file    Family History:  Family History  Problem Relation Age of Onset   Other Mother        healthy, lived to her 54's   Prostate cancer Father        lived to his 53's   Congestive Heart Failure Father    Valvular heart disease Sister    Prostate cancer Brother     Medications:   Current Outpatient Medications on File Prior to Visit  Medication Sig Dispense Refill   acetaminophen (TYLENOL) 500 MG tablet Take 1,000 mg by mouth daily.     aspirin EC 81 MG EC tablet Take 1 tablet (81 mg total) by mouth daily.     Insulin Glargine (LANTUS) 100 UNIT/ML Solostar Pen Inject 25 Units into the skin daily. 15 mL 11   polyethylene glycol (MIRALAX / GLYCOLAX) 17 g packet Take 17 g by mouth daily as needed for moderate constipation. (Patient not taking: Reported on 03/13/2019) 14 each 0   senna-docusate (SENOKOT-S) 8.6-50 MG tablet Take 1 tablet by mouth 2 (two) times daily. (Patient not taking: Reported on 03/13/2019)     No current facility-administered medications on file prior to visit.     Allergies:  No Known Allergies   Physical Exam  Vitals:   03/13/19 1345  BP: 121/68  Pulse: 65  Temp: 98.4 F (36.9 C)  TempSrc: Oral  Weight: 204 lb (92.5 kg)  Height: 5\' 11"  (1.803 m)   Body mass index is 28.45 kg/m. No exam data present  Depression screen [facility] 2/9 03/13/2019  Decreased Interest 0    Down, Depressed, Hopeless 0  PHQ - 2 Score 0  Altered sleeping 2  Tired, decreased energy 2  Change in appetite 0  Feeling bad or failure about yourself  0  Trouble concentrating 1  Moving slowly or fidgety/restless 2  Suicidal thoughts 0  PHQ-9 Score 7     General: well developed, well nourished,  elderly Caucasian male, seated, in no evident distress Head: head normocephalic and atraumatic.   Neck: supple with no carotid or supraclavicular bruits Cardiovascular: irregular rate (ventricular bigeminy on telemetry monitoring) and rhythm, no murmurs Musculoskeletal: no deformity Skin:  no rash/petichiae Vascular:  Normal pulses all extremities   Neurologic Exam Mental Status: Awake and fully alert. Mild dysarthria. Oriented to place and time. Recent and remote memory intact. Attention  span, concentration and fund of knowledge appropriate.  Flat affect with minimal eye contact but cooperative with history taking exam. Cranial Nerves: Fundoscopic exam reveals sharp disc margins. Pupils equal, briskly reactive to light. Extraocular movements full without nystagmus. Visual fields full to confrontation. Hearing intact. Facial sensation intact. Right facial droop.  Motor:  RUE: 2/5 with swelling RLE: 3/5 greatest in hip flexor and ankle dorsiflexion  LUE: full strength LLE: full strength  Sensory.: intact to touch , pinprick , position and vibratory sensation.  Coordination: Rapid alternating movements normal in all extremities except decreased right hand dexterity. Finger-to-nose and heel-to-shin performed accurately bilaterally. Gait and Station: Arises from chair with mild difficulty. Stance is normal. Gait demonstrates hemiplegic gait with use of RW Reflexes: 1+ and symmetric. Toes downgoing.     NIHSS  7 Modified Rankin  3    Diagnostic Data (Labs, Imaging, Testing)   CT HEAD 12/17/2018 IMPRESSION:  1. Subtle approximate 1 cm hypodensity involving the left paramedian pons,  increased in prominence from previous, and suspicious for evolving acute ischemic infarct. 2. Otherwise stable appearance of the head with associated age-related cerebral atrophy. No other acute abnormality identified.   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 No significant intraluminal thrombus or raised dissection flap identified.  2. No large vessel occlusion. No other hemodynamically significant or correctable stenosis.   MRI Wo Contrast  12/17/2018 IMPRESSION:  acute infarct involving left paramedian pons, left occipital lobe.  CXR- normal at OSH  Transthoracic Echocardiogram w/ bubble study Normal ejection fraction of 60 to 65%. Mild thickening of aortic valve with calcification  EKG - SB at OSH     ASSESSMENT: YSMAEL HIRES is a 73 y.o. 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  Patient declined further cardioembolic work-up.  Vascular risk factors include DM, HTN, HLD and medication noncompliance.     PLAN:  1. Embolic stroke: Continue aspirin 81 mg daily for secondary stroke prevention.  Discussion with patient regarding undergoing 30-day cardiac event monitor to rule out atrial fibrillation as potential cause of stroke.  Patient continues to decline further studies.  Maintain strict control of hypertension with blood pressure goal below 130/90, diabetes with hemoglobin A1c goal below 6.5% and cholesterol with LDL cholesterol (bad cholesterol) goal below 70 mg/dL.  I also advised the patient to eat a healthy diet with plenty of whole grains, cereals, fruits and  vegetables, exercise regularly with at least 30 minutes of continuous activity daily and maintain ideal body weight. 2. Residual deficits: Ongoing participation with outpatient therapies.  Discussion regarding initiating speech therapy but patient declines at this time.  Advised to call office if interested in the future 3. HTN: Advised to continue current treatment regimen.  Today's BP 121/68.  Advised to continue to monitor at home along with continued follow-up with PCP for management 4. HLD: Patient refuses statin use.  Continue to follow with PCP for ongoing monitoring of levels 5. DMII: Advised to continue to monitor glucose levels at home along with continued follow-up with PCP for management and monitoring 6. Reactive/post stroke depression: Patient declines interest in medication intervention or referral to therapy.  Advised  to continue to follow with PCP    Follow up in 3 months or call earlier if needed   Greater than 50% of time during this 45 minute visit was spent on counseling, explanation of diagnosis of embolic stroke, reviewing risk factor management of HTN, HLD and DM, planning of further management along with potential future management, and discussion with patient and family answering all questions.    Frann Rider, AGNP-BC  [facility] [address] [place], [place] [address]  Phone [phone] Fax [phone] Note: This document was prepared with digital dictation and possible smart phrase technology. Any transcriptional errors that result from this process are unintentional.

## 2019-03-14 DIAGNOSIS — I69322 Dysarthria following cerebral infarction: Secondary | ICD-10-CM | POA: Diagnosis not present

## 2019-03-14 DIAGNOSIS — I69351 Hemiplegia and hemiparesis following cerebral infarction affecting right dominant side: Secondary | ICD-10-CM | POA: Diagnosis not present

## 2019-03-14 DIAGNOSIS — R2681 Unsteadiness on feet: Secondary | ICD-10-CM | POA: Diagnosis not present

## 2019-03-14 DIAGNOSIS — R2689 Other abnormalities of gait and mobility: Secondary | ICD-10-CM | POA: Diagnosis not present

## 2019-03-14 DIAGNOSIS — M6281 Muscle weakness (generalized): Secondary | ICD-10-CM | POA: Diagnosis not present

## 2019-03-14 DIAGNOSIS — I69392 Facial weakness following cerebral infarction: Secondary | ICD-10-CM | POA: Diagnosis not present

## 2019-03-14 NOTE — Progress Notes (Signed)
I agree with the above plan 

## 2019-03-15 ENCOUNTER — Other Ambulatory Visit: Payer: Self-pay

## 2019-03-15 DIAGNOSIS — E7800 Pure hypercholesterolemia, unspecified: Secondary | ICD-10-CM | POA: Diagnosis not present

## 2019-03-15 DIAGNOSIS — Z6828 Body mass index (BMI) 28.0-28.9, adult: Secondary | ICD-10-CM | POA: Diagnosis not present

## 2019-03-15 DIAGNOSIS — E118 Type 2 diabetes mellitus with unspecified complications: Secondary | ICD-10-CM | POA: Diagnosis not present

## 2019-03-15 DIAGNOSIS — I6930 Unspecified sequelae of cerebral infarction: Secondary | ICD-10-CM | POA: Diagnosis not present

## 2019-03-15 DIAGNOSIS — I10 Essential (primary) hypertension: Secondary | ICD-10-CM | POA: Diagnosis not present

## 2019-03-15 DIAGNOSIS — K5909 Other constipation: Secondary | ICD-10-CM | POA: Diagnosis not present

## 2019-03-15 DIAGNOSIS — E663 Overweight: Secondary | ICD-10-CM | POA: Diagnosis not present

## 2019-03-15 DIAGNOSIS — Z8673 Personal history of transient ischemic attack (TIA), and cerebral infarction without residual deficits: Secondary | ICD-10-CM | POA: Diagnosis not present

## 2019-03-18 DIAGNOSIS — R2689 Other abnormalities of gait and mobility: Secondary | ICD-10-CM | POA: Diagnosis not present

## 2019-03-18 DIAGNOSIS — I69322 Dysarthria following cerebral infarction: Secondary | ICD-10-CM | POA: Diagnosis not present

## 2019-03-18 DIAGNOSIS — I69392 Facial weakness following cerebral infarction: Secondary | ICD-10-CM | POA: Diagnosis not present

## 2019-03-18 DIAGNOSIS — M6281 Muscle weakness (generalized): Secondary | ICD-10-CM | POA: Diagnosis not present

## 2019-03-18 DIAGNOSIS — R2681 Unsteadiness on feet: Secondary | ICD-10-CM | POA: Diagnosis not present

## 2019-03-18 DIAGNOSIS — I69351 Hemiplegia and hemiparesis following cerebral infarction affecting right dominant side: Secondary | ICD-10-CM | POA: Diagnosis not present

## 2019-03-19 ENCOUNTER — Encounter: Payer: Self-pay | Admitting: Internal Medicine

## 2019-03-19 ENCOUNTER — Ambulatory Visit (INDEPENDENT_AMBULATORY_CARE_PROVIDER_SITE_OTHER): Payer: Medicare HMO | Admitting: Internal Medicine

## 2019-03-19 ENCOUNTER — Other Ambulatory Visit: Payer: Self-pay

## 2019-03-19 VITALS — BP 158/90 | HR 64 | Ht 71.0 in | Wt 203.0 lb

## 2019-03-19 DIAGNOSIS — E1159 Type 2 diabetes mellitus with other circulatory complications: Secondary | ICD-10-CM

## 2019-03-19 DIAGNOSIS — E1165 Type 2 diabetes mellitus with hyperglycemia: Secondary | ICD-10-CM | POA: Insufficient documentation

## 2019-03-19 DIAGNOSIS — I6350 Cerebral infarction due to unspecified occlusion or stenosis of unspecified cerebral artery: Secondary | ICD-10-CM | POA: Diagnosis not present

## 2019-03-19 LAB — POCT GLYCOSYLATED HEMOGLOBIN (HGB A1C): Hemoglobin A1C: 6.5 % — AB (ref 4.0–5.6)

## 2019-03-19 NOTE — Progress Notes (Signed)
Patient ID: Robert Brewer, male   DOB: June 27, 1946, 73 y.o.   MRN: [id number]   HPI: Robert Brewer is a 73 y.o.-year-old male, referred by Cathlyn Parsons, PA-C, for management of DM2, dx in 2009, insulin-dependent since 12/2018, uncontrolled, with complications (stroke [id number]).  His wife, who is also my patient, Robert Brewer, accompanies the patient today and offers more information especially about his diet, medication, and previous medical care. PCP: Cher Nakai, MD,   Patient's history is significant for uncontrolled diabetes for which he was not taking any medicines for the last at least 3 years.    He had a recent pontine and occipital stroke, after which he developed right hemiparesis, right facial droop, dysarthria, and short-term memory loss. In PT now. Walks with a walker.  Reviewed latest HbA1c level: Lab Results  Component Value Date   HGBA1C 13.3 (H) 12/17/2018   Pt is on a regimen of: - Lantus 25 units at bedtime  -started 12/2018 He was on JanuMet in the past, but not in last 3 years.  Pt checks his sugars 1x a day and they are: - am: 90, 104-125, 144 - 2h after b'fast: n/c - before lunch: n/c - 2h after lunch: n/c - before dinner: n/c - 2h after dinner: n/c - bedtime: n/c - nighttime: n/c Lowest sugar was 66 in the hospital; he has hypoglycemia awareness at 70.  Highest sugar was 144.  Glucometer: ReliOn  Pt's meals are: - Breakfast: whole wheat cereal with milk, sometimes egg + toast + bacon; oatmeal or grits - Lunch: none - Dinner: steak + potatoes + veggies; pizza; salad + mayo - Snacks: PB sandwich + milk He was drinking a lot of regular sodas and milk before the stroke, also eating a lot of icecream.  She stopped these after the stroke  He is in physical therapy twice a week.  - no CKD, last BUN/creatinine:  Lab Results  Component Value Date   BUN 20 12/25/2018   BUN 19 12/17/2018   CREATININE 0.78 12/25/2018   CREATININE 0.79 12/17/2018   Not on an ACE inhibitor/ARB.  -+ HL; last set of lipids: Lab Results  Component Value Date   CHOL 227 (H) 12/17/2018   HDL 45 12/17/2018   LDLCALC 166 (H) 12/17/2018   TRIG 81 12/17/2018   CHOLHDL 5.0 12/17/2018  Not on a statin. He started on a statin and he had very good results but unfortunately, he stopped as LDL was 60 (?).  - last eye exam was in 06/2018. No DR.   - no numbness and tingling in his feet.  Pt has FH of DM in brother.  ROS: Constitutional: + weight gain, no weight loss, + fatigue, no subjective hyperthermia, no subjective hypothermia, + nocturia Eyes: no blurry vision, no xerophthalmia ENT: no sore throat, no nodules palpated in neck, + dysphagia, no odynophagia, no hoarseness, no tinnitus, + hypoacusis Cardiovascular: no CP, no SOB, no palpitations, + right leg and hand swelling Respiratory: + Cough, no SOB, no wheezing Gastrointestinal: no N, no V, no D, no C, no acid reflux. Musculoskeletal: + Muscle and joint pain Skin: no rash, no hair loss Neurological: no tremors, no numbness or tingling/no dizziness/no HAs Psychiatric: no depression, no anxiety + Low libido, difficulty with erections  No past medical history on file.   No past surgical history on file.   Social History   Socioeconomic History  . Marital status: Married    Spouse name: Not on file  .  Number of children: 0  . Years of education: Not on file  . Highest education level: Not on file  Occupational History  .  Retired  Scientific laboratory technician  . Financial resource strain: Not on file  . Food insecurity    Worry: Not on file    Inability: Not on file  . Transportation needs    Medical: Not on file    Non-medical: Not on file  Tobacco Use  . Smoking status: Never Smoker  . Smokeless tobacco: Never Used  Substance and Sexual Activity  . Alcohol use:  Beer    Frequency:  Rarely  . Drug use: No   Current Outpatient Medications on File Prior to Visit  Medication Sig Dispense Refill   . acetaminophen (TYLENOL) 500 MG tablet Take 1,000 mg by mouth daily.    Marland Kitchen aspirin EC 81 MG EC tablet Take 1 tablet (81 mg total) by mouth daily.    . Insulin Glargine (LANTUS) 100 UNIT/ML Solostar Pen Inject 25 Units into the skin daily. 15 mL 11  . polyethylene glycol (MIRALAX / GLYCOLAX) 17 g packet Take 17 g by mouth daily as needed for moderate constipation. (Patient not taking: Reported on 03/13/2019) 14 each 0  . senna-docusate (SENOKOT-S) 8.6-50 MG tablet Take 1 tablet by mouth 2 (two) times daily. (Patient not taking: Reported on 03/13/2019)     No current facility-administered medications on file prior to visit.    No Known Allergies Family History  Problem Relation Age of Onset  . Other Mother        healthy, lived to her 27's  . Prostate cancer Father        lived to his 85's  . Congestive Heart Failure Father   . Valvular heart disease Sister   . Prostate cancer Brother   + See HPI  PE: BP (!) 158/90   Pulse 64   Ht 5\' 11"  (1.803 m)   Wt 203 lb (92.1 kg)   SpO2 98%   BMI 28.31 kg/m  Wt Readings from Last 3 Encounters:  03/19/19 203 lb (92.1 kg)  03/13/19 204 lb (92.5 kg)  02/06/19 204 lb (92.5 kg)   Constitutional: overweight, in NAD, walks with a walker Eyes: PERRLA, EOMI, no exophthalmos ENT: moist mucous membranes, no thyromegaly, no cervical lymphadenopathy Cardiovascular: RRR, No MRG, + lower extremity swelling in right leg and right hand Respiratory: CTA B Gastrointestinal: abdomen soft, NT, ND, BS+ Musculoskeletal: no deformities, strength decreased in the right arm and leg Skin: moist, warm, no rashes Neurological: no tremor with outstretched hands, DTR normal in all 4  ASSESSMENT: 1. 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 Patient with long-standing, uncontrolled diabetes, with a history of a recent stroke after which he has  paresis on the right side of his body.  This is most likely due to his uncontrolled diabetes along with uncontrolled blood pressure and cholesterol levels.  Per his wife's report, patient was resistant to treatment for improving his chronic conditions but after his stroke in 11/2018, he accepted diabetes treatment with insulin and hyperlipidemia treatment with statin.  Unfortunately, he took the statin only for approximately 3 weeks with good results, after which he stopped.  We discussed that the statin will help with more than lowering cholesterol and I recommended that he continues to take it.   -Regarding his diabetes management, at today's visit, his HbA1c was significantly improved,  at 6.5%.  He is checking sugars once a day at home, in the morning, and they are at goal but we discussed about also checking sugars later in the day, rotating check times.  For now, I would like to continue his current insulin dose but at next visit, we may try to reduce the dose and introduce either a GLP-1 receptor agonist or a SGLT2 inhibitor. -He had an extremely poor diet before his stroke but he eliminated sweet drinks, ice cream, and other sweets for now.  I strongly advised him to stay off these.  They refused a referral to nutrition at this visit. - I suggested to:  Patient Instructions  Please continue: - Lantus 25 units at bedtime.  Please return in 3 months with your sugar log.   - Strongly advised him to start checking sugars at different times of the day - check 1x a day, rotating checks - discussed about CBG targets for treatment: 80-130 mg/dL before meals and <180 mg/dL after meals; target HbA1c <7%. - given sugar log and advised how to fill it and to bring it at next appt  - given foot care handout and explained the principles  - given instructions for hypoglycemia management "15-15 rule"  - advised for yearly eye exams  - Return to clinic in 3 mo with sugar log normal  Philemon Kingdom, MD  PhD [facility]

## 2019-03-19 NOTE — Addendum Note (Signed)
Addended by: Cardell Peach I on: 03/19/2019 04:52 PM   Modules accepted: Orders

## 2019-03-19 NOTE — Patient Instructions (Addendum)
Please continue: - Lantus 25 units at bedtime.  Please return in 3 months with your sugar log.   PATIENT INSTRUCTIONS FOR TYPE 2 DIABETES:  DIET AND EXERCISE Diet and exercise is an important part of diabetic treatment.  We recommended aerobic exercise in the form of brisk walking (working between 40-60% of maximal aerobic capacity, similar to brisk walking) for 150 minutes per week (such as 30 minutes five days per week) along with 3 times per week performing 'resistance' training (using various gauge rubber tubes with handles) 5-10 exercises involving the major muscle groups (upper body, lower body and core) performing 10-15 repetitions (or near fatigue) each exercise. Start at half the above goal but build slowly to reach the above goals. If limited by weight, joint pain, or disability, we recommend daily walking in a swimming pool with water up to waist to reduce pressure from joints while allow for adequate exercise.    BLOOD GLUCOSES Monitoring your blood glucoses is important for continued management of your diabetes. Please check your blood glucoses 2-4 times a day: fasting, before meals and at bedtime (you can rotate these measurements - e.g. one day check before the 3 meals, the next day check before 2 of the meals and before bedtime, etc.).   HYPOGLYCEMIA (low blood sugar) Hypoglycemia is usually a reaction to not eating, exercising, or taking too much insulin/ other diabetes drugs.  Symptoms include tremors, sweating, hunger, confusion, headache, etc. Treat IMMEDIATELY with 15 grams of Carbs: . 4 glucose tablets .  cup regular juice/soda . 2 tablespoons raisins . 4 teaspoons sugar . 1 tablespoon honey Recheck blood glucose in 15 mins and repeat above if still symptomatic/blood glucose <100.  RECOMMENDATIONS TO REDUCE YOUR RISK OF DIABETIC COMPLICATIONS: * Take your prescribed MEDICATION(S) * Follow a DIABETIC diet: Complex carbs, fiber rich foods, (monounsaturated and  polyunsaturated) fats * AVOID saturated/trans fats, high fat foods, >2,300 mg salt per day. * EXERCISE at least 5 times a week for 30 minutes or preferably daily.  * DO NOT SMOKE OR DRINK more than 1 drink a day. * Check your FEET every day. Do not wear tightfitting shoes. Contact us if you develop an ulcer * See your EYE doctor once a year or more if needed * Get a FLU shot once a year * Get a PNEUMONIA vaccine once before and once after age 37 years  GOALS:  * Your Hemoglobin A1c of <7%  * fasting sugars need to be <130 * after meals sugars need to be <180 (2h after you start eating) * Your Systolic BP should be [id number] or lower  * Your Diastolic BP should be 80 or lower  * Your HDL (Good Cholesterol) should be 40 or higher  * Your LDL (Bad Cholesterol) should be 100 or lower. * Your Triglycerides should be 150 or lower  * Your Urine microalbumin (kidney function) should be <30 * Your Body Mass Index should be 25 or lower    Please consider the following ways to cut down carbs and fat and increase fiber and micronutrients in your diet: - substitute whole grain for white bread or pasta - substitute brown rice for white rice - substitute 90-calorie flat bread pieces for slices of bread when possible - substitute sweet potatoes or yams for white potatoes - substitute humus for margarine - substitute tofu for cheese when possible - substitute almond or rice milk for regular milk (would not drink soy milk daily due to concern for soy estrogen  influence on breast cancer risk) - substitute dark chocolate for other sweets when possible - substitute water - can add lemon or orange slices for taste - for diet sodas (artificial sweeteners will trick your body that you can eat sweets without getting calories and will lead you to overeating and weight gain in the long run) - do not skip breakfast or other meals (this will slow down the metabolism and will result in more weight gain over time)  -  can try smoothies made from fruit and almond/rice milk in am instead of regular breakfast - can also try old-fashioned (not instant) oatmeal made with almond/rice milk in am - order the dressing on the side when eating salad at a restaurant (pour less than half of the dressing on the salad) - eat as little meat as possible - can try juicing, but should not forget that juicing will get rid of the fiber, so would alternate with eating raw veg./fruits or drinking smoothies - use as little oil as possible, even when using olive oil - can dress a salad with a mix of balsamic vinegar and lemon juice, for e.g. - use agave nectar, stevia sugar, or regular sugar rather than artificial sweateners - steam or broil/roast veggies  - snack on veggies/fruit/nuts (unsalted, preferably) when possible, rather than processed foods - reduce or eliminate aspartame in diet (it is in diet sodas, chewing gum, etc) Read the labels!  Try to read Dr. Janene Harvey book: "Program for Reversing Diabetes" for other ideas for healthy eating.

## 2019-03-21 DIAGNOSIS — I69392 Facial weakness following cerebral infarction: Secondary | ICD-10-CM | POA: Diagnosis not present

## 2019-03-21 DIAGNOSIS — I69322 Dysarthria following cerebral infarction: Secondary | ICD-10-CM | POA: Diagnosis not present

## 2019-03-21 DIAGNOSIS — R2681 Unsteadiness on feet: Secondary | ICD-10-CM | POA: Diagnosis not present

## 2019-03-21 DIAGNOSIS — I69351 Hemiplegia and hemiparesis following cerebral infarction affecting right dominant side: Secondary | ICD-10-CM | POA: Diagnosis not present

## 2019-03-21 DIAGNOSIS — M6281 Muscle weakness (generalized): Secondary | ICD-10-CM | POA: Diagnosis not present

## 2019-03-21 DIAGNOSIS — R2689 Other abnormalities of gait and mobility: Secondary | ICD-10-CM | POA: Diagnosis not present

## 2019-03-22 ENCOUNTER — Encounter: Payer: Medicare HMO | Attending: Registered Nurse | Admitting: Physical Medicine & Rehabilitation

## 2019-03-22 ENCOUNTER — Encounter: Payer: Self-pay | Admitting: Physical Medicine & Rehabilitation

## 2019-03-22 ENCOUNTER — Other Ambulatory Visit: Payer: Self-pay

## 2019-03-22 VITALS — BP 147/85 | HR 58 | Temp 98.7°F | Wt 200.6 lb

## 2019-03-22 DIAGNOSIS — G8191 Hemiplegia, unspecified affecting right dominant side: Secondary | ICD-10-CM | POA: Insufficient documentation

## 2019-03-22 DIAGNOSIS — F329 Major depressive disorder, single episode, unspecified: Secondary | ICD-10-CM | POA: Diagnosis not present

## 2019-03-22 DIAGNOSIS — I6350 Cerebral infarction due to unspecified occlusion or stenosis of unspecified cerebral artery: Secondary | ICD-10-CM | POA: Diagnosis not present

## 2019-03-22 NOTE — Progress Notes (Signed)
Subjective:    Patient ID: Robert Brewer, male    DOB: [birth year], 73 y.o.   MRN: [id number] 73 year old right-handed male with history of diabetes mellitus, hypertension and medical noncompliance. On no prescription medications. Lives with spouse. Independent prior to admission. Presented to [facility] 12/17/2018 with acute onset of right side weakness slurred speech and facial droop while mowing the grass. CT of the head at outside hospital showed no hemorrhage. Patient did receive TPA was transferred to [facility]. CT angiogram of head and neck showed 1 cm hypodensity involving the left paramedian pons and suspicious for evolving acute ischemic infarction. CTA head and neck with no large vessel occlusion no significant intraluminal thrombus or dissection identified. MRI confirms acute infarct left paramedian pons small acute cortical infarct left occipital lobe. Negative for hemorrhage. Maintained on aspirin for CVA prophylaxis. Echocardiogram with ejection fraction of 123456 normal systolic function negative bubble study. Recommendations were made for loop recorder but patient refused. Hemoglobin A1c 13.3 insulin therapy as directed. 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  His stroke was in May and initial hemoglobin A1c was 13.  Patient indicates that his latest hemoglobin A1c was down to 6.5.  He has been seeing a endocrinologist. He complains of some swelling in the right hand.  He does not use the Isotoner glove given to him by his therapist.  He is not doing any type of massage. He does not have any severe pain in the hand.  He does not have any shoulder pain No lower extremity pain. He does have an AFO which he does not use. Pain Inventory Average Pain 7 Pain Right Now 5 My  pain is intermittent, dull and aching  In the last 24 hours, has pain interfered with the following? General activity 5 Relation with others 5 Enjoyment of life 7 What TIME of day is your pain at its worst? morning and evening  Sleep (in general) Fair  Pain is worse with: bending and some activites Pain improves with: rest and medication Relief from Meds: 7  Mobility use a walker how many minutes can you walk? 15 ability to climb steps?  yes do you drive?  no Do you have any goals in this area?  yes  Function not employed: date last employed 07/20/12 I need assistance with the following:  dressing, meal prep, household duties and shopping  Neuro/Psych numbness trouble walking  Prior Studies Any changes since last visit?  no  Physicians involved in your care Any changes since last visit?  no   Family History  Problem Relation Age of Onset  . Other Mother        healthy, lived to her 52's  . Prostate cancer Father        lived to his 86's  . Congestive Heart Failure Father   . Valvular heart disease Sister   . Prostate cancer Brother    Social History   Socioeconomic History  . Marital status: Married    Spouse name: Not on file  . Number of children: Not on file  . Years of education: Not on file  . Highest education level: Not on file  Occupational History  . Not on file  Social Needs  . Financial resource strain: Not on file  . Food insecurity    Worry: Not  on file    Inability: Not on file  . Transportation needs    Medical: Not on file    Non-medical: Not on file  Tobacco Use  . Smoking status: Never Smoker  . Smokeless tobacco: Never Used  Substance and Sexual Activity  . Alcohol use: Never    Frequency: Never  . Drug use: Not on file  . Sexual activity: Not on file  Lifestyle  . Physical activity    Days per week: Not on file    Minutes per session: Not on file  . Stress: Not on file  Relationships  . Social Herbalist on  phone: Not on file    Gets together: Not on file    Attends religious service: Not on file    Active member of club or organization: Not on file    Attends meetings of clubs or organizations: Not on file    Relationship status: Not on file  Other Topics Concern  . Not on file  Social History Narrative  . Not on file   No past surgical history on file. No past medical history on file. There were no vitals taken for this visit.  Opioid Risk Score:   Fall Risk Score:  `1  Depression screen PHQ 2/9  Depression screen [facility] 2/9 03/13/2019 02/06/2019  Decreased Interest 0 0  Down, Depressed, Hopeless 0 0  PHQ - 2 Score 0 0  Altered sleeping 2 2  Tired, decreased energy 2 3  Change in appetite 0 0  Feeling bad or failure about yourself  0 1  Trouble concentrating 1 1  Moving slowly or fidgety/restless 2 2  Suicidal thoughts 0 0  PHQ-9 Score 7 9    Review of Systems  Constitutional: Negative.   HENT: Negative.   Eyes: Negative.   Respiratory: Positive for cough.   Cardiovascular: Negative.   Gastrointestinal: Negative.   Endocrine: Negative.   Genitourinary: Negative.   Musculoskeletal: Positive for gait problem and joint swelling.  Skin: Negative.   Allergic/Immunologic: Negative.   Neurological: Positive for weakness.  Hematological: Negative.   Psychiatric/Behavioral: Negative.   All other systems reviewed and are negative.      Objective:   Physical Exam Vitals signs and nursing note reviewed.  Constitutional:      Appearance: Normal appearance.  Eyes:     General: No visual field deficit.    Extraocular Movements: Extraocular movements intact.     Conjunctiva/sclera: Conjunctivae normal.     Pupils: Pupils are equal, round, and reactive to light.  Skin:    General: Skin is warm and dry.  Neurological:     General: No focal deficit present.     Mental Status: He is alert and oriented to person, place, and time.     Cranial Nerves: No dysarthria.      Sensory: Sensation is intact.     Coordination: Coordination abnormal. Finger-Nose-Finger Test abnormal.     Gait: Gait abnormal.     Comments: Motor strength is 3- at the right deltoid bicep tricep grip right hip flexor, 4/5 in the right knee extensors 3- at the ankle dorsiflexor. Patient has mild foot drop on the right side. No knee instability. Unable to oppose thumb to fifth digit.  Finger to thumb is otherwise slow in the right hand.    Psychiatric:        Mood and Affect: Mood normal.        Behavior: Behavior normal.  Assessment & Plan:  1.  Left pontine infarct with right hemiparesis.  He is making some improvements he is now 3 months post stroke I discussed both with him and his wife that his progress will be slowing down given the timeframe of his recovery. At this point I do not see the possibility returning driving.  I will reevaluate him in 2 months.  2.  Right upper extremity swelling dependent edema also weakness in the hand.  Elevation and finger to wrist retrograde massage 100 strokes per day

## 2019-03-22 NOTE — Patient Instructions (Addendum)
Elevate right hand and massage 100 strokes from fingers to wrist every day   Slow down with eating and chewing

## 2019-03-26 DIAGNOSIS — I69392 Facial weakness following cerebral infarction: Secondary | ICD-10-CM | POA: Diagnosis not present

## 2019-03-26 DIAGNOSIS — M25311 Other instability, right shoulder: Secondary | ICD-10-CM | POA: Diagnosis not present

## 2019-03-26 DIAGNOSIS — M25511 Pain in right shoulder: Secondary | ICD-10-CM | POA: Diagnosis not present

## 2019-03-26 DIAGNOSIS — I69322 Dysarthria following cerebral infarction: Secondary | ICD-10-CM | POA: Diagnosis not present

## 2019-03-26 DIAGNOSIS — I69351 Hemiplegia and hemiparesis following cerebral infarction affecting right dominant side: Secondary | ICD-10-CM | POA: Diagnosis not present

## 2019-03-26 DIAGNOSIS — R278 Other lack of coordination: Secondary | ICD-10-CM | POA: Diagnosis not present

## 2019-03-28 DIAGNOSIS — R278 Other lack of coordination: Secondary | ICD-10-CM | POA: Diagnosis not present

## 2019-03-28 DIAGNOSIS — M25311 Other instability, right shoulder: Secondary | ICD-10-CM | POA: Diagnosis not present

## 2019-03-28 DIAGNOSIS — I69322 Dysarthria following cerebral infarction: Secondary | ICD-10-CM | POA: Diagnosis not present

## 2019-03-28 DIAGNOSIS — I69351 Hemiplegia and hemiparesis following cerebral infarction affecting right dominant side: Secondary | ICD-10-CM | POA: Diagnosis not present

## 2019-03-28 DIAGNOSIS — I69392 Facial weakness following cerebral infarction: Secondary | ICD-10-CM | POA: Diagnosis not present

## 2019-03-28 DIAGNOSIS — M25511 Pain in right shoulder: Secondary | ICD-10-CM | POA: Diagnosis not present

## 2019-03-29 ENCOUNTER — Other Ambulatory Visit: Payer: Self-pay

## 2019-03-29 DIAGNOSIS — I10 Essential (primary) hypertension: Secondary | ICD-10-CM | POA: Diagnosis not present

## 2019-03-29 DIAGNOSIS — E118 Type 2 diabetes mellitus with unspecified complications: Secondary | ICD-10-CM | POA: Diagnosis not present

## 2019-03-29 DIAGNOSIS — K5909 Other constipation: Secondary | ICD-10-CM | POA: Diagnosis not present

## 2019-03-29 DIAGNOSIS — Z9181 History of falling: Secondary | ICD-10-CM | POA: Diagnosis not present

## 2019-03-29 DIAGNOSIS — E7800 Pure hypercholesterolemia, unspecified: Secondary | ICD-10-CM | POA: Diagnosis not present

## 2019-03-29 DIAGNOSIS — E663 Overweight: Secondary | ICD-10-CM | POA: Diagnosis not present

## 2019-03-29 DIAGNOSIS — Z6828 Body mass index (BMI) 28.0-28.9, adult: Secondary | ICD-10-CM | POA: Diagnosis not present

## 2019-03-29 DIAGNOSIS — I6930 Unspecified sequelae of cerebral infarction: Secondary | ICD-10-CM | POA: Diagnosis not present

## 2019-03-29 DIAGNOSIS — Z8673 Personal history of transient ischemic attack (TIA), and cerebral infarction without residual deficits: Secondary | ICD-10-CM | POA: Diagnosis not present

## 2019-04-02 DIAGNOSIS — M25511 Pain in right shoulder: Secondary | ICD-10-CM | POA: Diagnosis not present

## 2019-04-02 DIAGNOSIS — M25311 Other instability, right shoulder: Secondary | ICD-10-CM | POA: Diagnosis not present

## 2019-04-02 DIAGNOSIS — I69322 Dysarthria following cerebral infarction: Secondary | ICD-10-CM | POA: Diagnosis not present

## 2019-04-02 DIAGNOSIS — I69392 Facial weakness following cerebral infarction: Secondary | ICD-10-CM | POA: Diagnosis not present

## 2019-04-02 DIAGNOSIS — R278 Other lack of coordination: Secondary | ICD-10-CM | POA: Diagnosis not present

## 2019-04-02 DIAGNOSIS — I69351 Hemiplegia and hemiparesis following cerebral infarction affecting right dominant side: Secondary | ICD-10-CM | POA: Diagnosis not present

## 2019-04-04 ENCOUNTER — Other Ambulatory Visit: Payer: Self-pay

## 2019-04-04 DIAGNOSIS — M25311 Other instability, right shoulder: Secondary | ICD-10-CM | POA: Diagnosis not present

## 2019-04-04 DIAGNOSIS — I69392 Facial weakness following cerebral infarction: Secondary | ICD-10-CM | POA: Diagnosis not present

## 2019-04-04 DIAGNOSIS — R278 Other lack of coordination: Secondary | ICD-10-CM | POA: Diagnosis not present

## 2019-04-04 DIAGNOSIS — M25511 Pain in right shoulder: Secondary | ICD-10-CM | POA: Diagnosis not present

## 2019-04-04 DIAGNOSIS — I69322 Dysarthria following cerebral infarction: Secondary | ICD-10-CM | POA: Diagnosis not present

## 2019-04-04 DIAGNOSIS — I69351 Hemiplegia and hemiparesis following cerebral infarction affecting right dominant side: Secondary | ICD-10-CM | POA: Diagnosis not present

## 2019-04-04 NOTE — Patient Outreach (Signed)
Second attempt to obtain mRs. No answer. Left message for return call.  

## 2019-04-08 ENCOUNTER — Other Ambulatory Visit: Payer: Self-pay

## 2019-04-08 NOTE — Patient Outreach (Signed)
3 outreach attempts were completed to obtain mRs. mRs could not be obtained because patient never returned my calls. MRs=7  Patient and wife (on [place]) have same phone number.

## 2019-04-09 DIAGNOSIS — I69322 Dysarthria following cerebral infarction: Secondary | ICD-10-CM | POA: Diagnosis not present

## 2019-04-09 DIAGNOSIS — I69351 Hemiplegia and hemiparesis following cerebral infarction affecting right dominant side: Secondary | ICD-10-CM | POA: Diagnosis not present

## 2019-04-09 DIAGNOSIS — M25311 Other instability, right shoulder: Secondary | ICD-10-CM | POA: Diagnosis not present

## 2019-04-09 DIAGNOSIS — I69392 Facial weakness following cerebral infarction: Secondary | ICD-10-CM | POA: Diagnosis not present

## 2019-04-09 DIAGNOSIS — R278 Other lack of coordination: Secondary | ICD-10-CM | POA: Diagnosis not present

## 2019-04-09 DIAGNOSIS — M25511 Pain in right shoulder: Secondary | ICD-10-CM | POA: Diagnosis not present

## 2019-04-11 DIAGNOSIS — I69322 Dysarthria following cerebral infarction: Secondary | ICD-10-CM | POA: Diagnosis not present

## 2019-04-11 DIAGNOSIS — R278 Other lack of coordination: Secondary | ICD-10-CM | POA: Diagnosis not present

## 2019-04-11 DIAGNOSIS — M25311 Other instability, right shoulder: Secondary | ICD-10-CM | POA: Diagnosis not present

## 2019-04-11 DIAGNOSIS — I69351 Hemiplegia and hemiparesis following cerebral infarction affecting right dominant side: Secondary | ICD-10-CM | POA: Diagnosis not present

## 2019-04-11 DIAGNOSIS — M25511 Pain in right shoulder: Secondary | ICD-10-CM | POA: Diagnosis not present

## 2019-04-11 DIAGNOSIS — I69392 Facial weakness following cerebral infarction: Secondary | ICD-10-CM | POA: Diagnosis not present

## 2019-04-16 DIAGNOSIS — I69392 Facial weakness following cerebral infarction: Secondary | ICD-10-CM | POA: Diagnosis not present

## 2019-04-16 DIAGNOSIS — M25311 Other instability, right shoulder: Secondary | ICD-10-CM | POA: Diagnosis not present

## 2019-04-16 DIAGNOSIS — I69322 Dysarthria following cerebral infarction: Secondary | ICD-10-CM | POA: Diagnosis not present

## 2019-04-16 DIAGNOSIS — M25511 Pain in right shoulder: Secondary | ICD-10-CM | POA: Diagnosis not present

## 2019-04-16 DIAGNOSIS — R278 Other lack of coordination: Secondary | ICD-10-CM | POA: Diagnosis not present

## 2019-04-16 DIAGNOSIS — I69351 Hemiplegia and hemiparesis following cerebral infarction affecting right dominant side: Secondary | ICD-10-CM | POA: Diagnosis not present

## 2019-04-18 DIAGNOSIS — I69392 Facial weakness following cerebral infarction: Secondary | ICD-10-CM | POA: Diagnosis not present

## 2019-04-18 DIAGNOSIS — I69322 Dysarthria following cerebral infarction: Secondary | ICD-10-CM | POA: Diagnosis not present

## 2019-04-18 DIAGNOSIS — I69351 Hemiplegia and hemiparesis following cerebral infarction affecting right dominant side: Secondary | ICD-10-CM | POA: Diagnosis not present

## 2019-04-18 DIAGNOSIS — M25311 Other instability, right shoulder: Secondary | ICD-10-CM | POA: Diagnosis not present

## 2019-04-18 DIAGNOSIS — M25511 Pain in right shoulder: Secondary | ICD-10-CM | POA: Diagnosis not present

## 2019-04-18 DIAGNOSIS — R278 Other lack of coordination: Secondary | ICD-10-CM | POA: Diagnosis not present

## 2019-04-19 DIAGNOSIS — I6350 Cerebral infarction due to unspecified occlusion or stenosis of unspecified cerebral artery: Secondary | ICD-10-CM | POA: Diagnosis not present

## 2019-04-23 DIAGNOSIS — M25311 Other instability, right shoulder: Secondary | ICD-10-CM | POA: Diagnosis not present

## 2019-04-23 DIAGNOSIS — I69322 Dysarthria following cerebral infarction: Secondary | ICD-10-CM | POA: Diagnosis not present

## 2019-04-23 DIAGNOSIS — M25511 Pain in right shoulder: Secondary | ICD-10-CM | POA: Diagnosis not present

## 2019-04-23 DIAGNOSIS — R278 Other lack of coordination: Secondary | ICD-10-CM | POA: Diagnosis not present

## 2019-04-23 DIAGNOSIS — I69392 Facial weakness following cerebral infarction: Secondary | ICD-10-CM | POA: Diagnosis not present

## 2019-04-23 DIAGNOSIS — I69351 Hemiplegia and hemiparesis following cerebral infarction affecting right dominant side: Secondary | ICD-10-CM | POA: Diagnosis not present

## 2019-04-25 DIAGNOSIS — I69322 Dysarthria following cerebral infarction: Secondary | ICD-10-CM | POA: Diagnosis not present

## 2019-04-25 DIAGNOSIS — R278 Other lack of coordination: Secondary | ICD-10-CM | POA: Diagnosis not present

## 2019-04-25 DIAGNOSIS — M25311 Other instability, right shoulder: Secondary | ICD-10-CM | POA: Diagnosis not present

## 2019-04-25 DIAGNOSIS — M25511 Pain in right shoulder: Secondary | ICD-10-CM | POA: Diagnosis not present

## 2019-04-25 DIAGNOSIS — I69351 Hemiplegia and hemiparesis following cerebral infarction affecting right dominant side: Secondary | ICD-10-CM | POA: Diagnosis not present

## 2019-04-25 DIAGNOSIS — I69392 Facial weakness following cerebral infarction: Secondary | ICD-10-CM | POA: Diagnosis not present

## 2019-04-26 DIAGNOSIS — Z8673 Personal history of transient ischemic attack (TIA), and cerebral infarction without residual deficits: Secondary | ICD-10-CM | POA: Diagnosis not present

## 2019-04-26 DIAGNOSIS — I10 Essential (primary) hypertension: Secondary | ICD-10-CM | POA: Diagnosis not present

## 2019-04-26 DIAGNOSIS — E118 Type 2 diabetes mellitus with unspecified complications: Secondary | ICD-10-CM | POA: Diagnosis not present

## 2019-04-26 DIAGNOSIS — Z6828 Body mass index (BMI) 28.0-28.9, adult: Secondary | ICD-10-CM | POA: Diagnosis not present

## 2019-04-26 DIAGNOSIS — E7800 Pure hypercholesterolemia, unspecified: Secondary | ICD-10-CM | POA: Diagnosis not present

## 2019-04-26 DIAGNOSIS — I6930 Unspecified sequelae of cerebral infarction: Secondary | ICD-10-CM | POA: Diagnosis not present

## 2019-04-26 DIAGNOSIS — K5909 Other constipation: Secondary | ICD-10-CM | POA: Diagnosis not present

## 2019-04-26 DIAGNOSIS — E663 Overweight: Secondary | ICD-10-CM | POA: Diagnosis not present

## 2019-04-30 DIAGNOSIS — I69392 Facial weakness following cerebral infarction: Secondary | ICD-10-CM | POA: Diagnosis not present

## 2019-04-30 DIAGNOSIS — I69351 Hemiplegia and hemiparesis following cerebral infarction affecting right dominant side: Secondary | ICD-10-CM | POA: Diagnosis not present

## 2019-04-30 DIAGNOSIS — M25511 Pain in right shoulder: Secondary | ICD-10-CM | POA: Diagnosis not present

## 2019-04-30 DIAGNOSIS — I69322 Dysarthria following cerebral infarction: Secondary | ICD-10-CM | POA: Diagnosis not present

## 2019-04-30 DIAGNOSIS — R278 Other lack of coordination: Secondary | ICD-10-CM | POA: Diagnosis not present

## 2019-04-30 DIAGNOSIS — M25311 Other instability, right shoulder: Secondary | ICD-10-CM | POA: Diagnosis not present

## 2019-05-02 DIAGNOSIS — M25511 Pain in right shoulder: Secondary | ICD-10-CM | POA: Diagnosis not present

## 2019-05-02 DIAGNOSIS — I69351 Hemiplegia and hemiparesis following cerebral infarction affecting right dominant side: Secondary | ICD-10-CM | POA: Diagnosis not present

## 2019-05-02 DIAGNOSIS — I69392 Facial weakness following cerebral infarction: Secondary | ICD-10-CM | POA: Diagnosis not present

## 2019-05-02 DIAGNOSIS — M25311 Other instability, right shoulder: Secondary | ICD-10-CM | POA: Diagnosis not present

## 2019-05-02 DIAGNOSIS — R278 Other lack of coordination: Secondary | ICD-10-CM | POA: Diagnosis not present

## 2019-05-02 DIAGNOSIS — I69322 Dysarthria following cerebral infarction: Secondary | ICD-10-CM | POA: Diagnosis not present

## 2019-05-07 DIAGNOSIS — M25511 Pain in right shoulder: Secondary | ICD-10-CM | POA: Diagnosis not present

## 2019-05-07 DIAGNOSIS — I69322 Dysarthria following cerebral infarction: Secondary | ICD-10-CM | POA: Diagnosis not present

## 2019-05-07 DIAGNOSIS — M25311 Other instability, right shoulder: Secondary | ICD-10-CM | POA: Diagnosis not present

## 2019-05-07 DIAGNOSIS — I69392 Facial weakness following cerebral infarction: Secondary | ICD-10-CM | POA: Diagnosis not present

## 2019-05-07 DIAGNOSIS — R278 Other lack of coordination: Secondary | ICD-10-CM | POA: Diagnosis not present

## 2019-05-07 DIAGNOSIS — I69351 Hemiplegia and hemiparesis following cerebral infarction affecting right dominant side: Secondary | ICD-10-CM | POA: Diagnosis not present

## 2019-05-09 DIAGNOSIS — M25311 Other instability, right shoulder: Secondary | ICD-10-CM | POA: Diagnosis not present

## 2019-05-09 DIAGNOSIS — I69322 Dysarthria following cerebral infarction: Secondary | ICD-10-CM | POA: Diagnosis not present

## 2019-05-09 DIAGNOSIS — R278 Other lack of coordination: Secondary | ICD-10-CM | POA: Diagnosis not present

## 2019-05-09 DIAGNOSIS — I69392 Facial weakness following cerebral infarction: Secondary | ICD-10-CM | POA: Diagnosis not present

## 2019-05-09 DIAGNOSIS — M25511 Pain in right shoulder: Secondary | ICD-10-CM | POA: Diagnosis not present

## 2019-05-09 DIAGNOSIS — I69351 Hemiplegia and hemiparesis following cerebral infarction affecting right dominant side: Secondary | ICD-10-CM | POA: Diagnosis not present

## 2019-05-14 DIAGNOSIS — I69392 Facial weakness following cerebral infarction: Secondary | ICD-10-CM | POA: Diagnosis not present

## 2019-05-14 DIAGNOSIS — R278 Other lack of coordination: Secondary | ICD-10-CM | POA: Diagnosis not present

## 2019-05-14 DIAGNOSIS — I69322 Dysarthria following cerebral infarction: Secondary | ICD-10-CM | POA: Diagnosis not present

## 2019-05-14 DIAGNOSIS — M25511 Pain in right shoulder: Secondary | ICD-10-CM | POA: Diagnosis not present

## 2019-05-14 DIAGNOSIS — I69351 Hemiplegia and hemiparesis following cerebral infarction affecting right dominant side: Secondary | ICD-10-CM | POA: Diagnosis not present

## 2019-05-14 DIAGNOSIS — M25311 Other instability, right shoulder: Secondary | ICD-10-CM | POA: Diagnosis not present

## 2019-05-16 DIAGNOSIS — M25511 Pain in right shoulder: Secondary | ICD-10-CM | POA: Diagnosis not present

## 2019-05-16 DIAGNOSIS — I69322 Dysarthria following cerebral infarction: Secondary | ICD-10-CM | POA: Diagnosis not present

## 2019-05-16 DIAGNOSIS — I69392 Facial weakness following cerebral infarction: Secondary | ICD-10-CM | POA: Diagnosis not present

## 2019-05-16 DIAGNOSIS — M25311 Other instability, right shoulder: Secondary | ICD-10-CM | POA: Diagnosis not present

## 2019-05-16 DIAGNOSIS — R278 Other lack of coordination: Secondary | ICD-10-CM | POA: Diagnosis not present

## 2019-05-16 DIAGNOSIS — I69351 Hemiplegia and hemiparesis following cerebral infarction affecting right dominant side: Secondary | ICD-10-CM | POA: Diagnosis not present

## 2019-05-20 ENCOUNTER — Encounter: Payer: Medicare HMO | Attending: Registered Nurse | Admitting: Physical Medicine & Rehabilitation

## 2019-05-20 ENCOUNTER — Encounter: Payer: Self-pay | Admitting: Physical Medicine & Rehabilitation

## 2019-05-20 ENCOUNTER — Other Ambulatory Visit: Payer: Self-pay

## 2019-05-20 VITALS — BP 161/77 | HR 57 | Temp 97.7°F | Ht 71.5 in | Wt 205.0 lb

## 2019-05-20 DIAGNOSIS — F329 Major depressive disorder, single episode, unspecified: Secondary | ICD-10-CM | POA: Diagnosis not present

## 2019-05-20 DIAGNOSIS — G8191 Hemiplegia, unspecified affecting right dominant side: Secondary | ICD-10-CM | POA: Diagnosis not present

## 2019-05-20 DIAGNOSIS — I6350 Cerebral infarction due to unspecified occlusion or stenosis of unspecified cerebral artery: Secondary | ICD-10-CM | POA: Diagnosis not present

## 2019-05-20 DIAGNOSIS — I639 Cerebral infarction, unspecified: Secondary | ICD-10-CM

## 2019-05-20 NOTE — Progress Notes (Signed)
Subjective:  73 year old right-handed male with history of diabetes mellitus, hypertension and medical noncompliance. On no prescription medications. Lives with spouse. Independent prior to admission. Presented to [facility] 12/17/2018 with acute onset of right side weakness slurred speech and facial droop while mowing the grass. CT of the head at outside hospital showed no hemorrhage. Patient did receive TPA was transferred to [facility]. CT angiogram of head and neck showed 1 cm hypodensity involving the left paramedian pons and suspicious for evolving acute ischemic infarction. CTA head and neck with no large vessel occlusion no significant intraluminal thrombus or dissection identified. MRI confirms acute infarct left paramedian pons small acute cortical infarct left occipital lobe. Negative for hemorrhage. Maintained on aspirin for CVA prophylaxis. Echocardiogram with ejection fraction of 123456 normal systolic function negative bubble study. Recommendations were made for loop recorder but patient refused. Hemoglobin A1c 13.3 insulin therapy as directed. Patient was admitted for a comprehensive rehab program  Admit date:12/24/2018 Discharge date:01/18/2019  Patient ID: Robert Brewer, male    DOB: 1946-07-11, 73 y.o.   MRN: [id number]  HPI Mod I dressing and bathing  Amb with 4 pt cane  Still seeing endocrine last Hg A1 C 6.5  Right hand swelling improved , still has swelling in the fingers  Fell off chair ~2-3 wks ago [employer] tripped by cat, was not using cane  Pain Inventory Average Pain 5 Pain Right Now 2 My pain is intermittent and aching  In the last 24 hours, has pain interfered with the following? General activity 2 Relation with others 0 Enjoyment of life 3 What TIME of day is your pain at its worst? morning, night  Sleep (in general) Fair  Pain is worse with: bending and some activites Pain improves with: medication Relief from Meds: 5  Mobility  walk with assistance use a cane how many minutes can you walk? 30 ability to climb steps?  yes do you drive?  yes  Function retired  Neuro/Psych weakness tingling trouble walking depression anxiety  Prior Studies Any changes since last visit?  no  Physicians involved in your care Any changes since last visit?  no   Family History  Problem Relation Age of Onset  . Other Mother        healthy, lived to her 62's  . Prostate cancer Father        lived to his 7's  . Congestive Heart Failure Father   . Valvular heart disease Sister   . Prostate cancer Brother    Social History   Socioeconomic History  . Marital status: Married    Spouse name: Not on file  . Number of children: Not on file  . Years of education: Not on file  . Highest education level: Not on file  Occupational History  . Not on file  Social Needs  . Financial resource strain: Not on file  . Food insecurity    Worry: Not on file    Inability: Not on file  . Transportation needs    Medical: Not on file    Non-medical: Not on file  Tobacco Use  . Smoking status: Never Smoker  . Smokeless tobacco: Never Used  Substance and Sexual Activity  . Alcohol use: Never    Frequency: Never  . Drug use: Not on file  . Sexual activity: Not on file  Lifestyle  . Physical activity    Days per week: Not on file    Minutes per session: Not  on file  . Stress: Not on file  Relationships  . Social Herbalist on phone: Not on file    Gets together: Not on file    Attends religious service: Not on file    Active member of club or organization: Not on file    Attends meetings of clubs or organizations: Not on file    Relationship status: Not on file  Other Topics Concern  . Not on file  Social History Narrative  . Not on file   History reviewed. No pertinent surgical history. History reviewed. No pertinent past medical history. BP (!) 161/77   Pulse (!) 57   Temp 97.7 F (36.5 C)   Ht 5'  11.5" (1.816 m)   Wt 205 lb (93 kg)   SpO2 98%   BMI 28.19 kg/m   Opioid Risk Score:   Fall Risk Score:  `1  Depression screen PHQ 2/9  Depression screen [facility] 2/9 03/13/2019 02/06/2019  Decreased Interest 0 0  Down, Depressed, Hopeless 0 0  PHQ - 2 Score 0 0  Altered sleeping 2 2  Tired, decreased energy 2 3  Change in appetite 0 0  Feeling bad or failure about yourself  0 1  Trouble concentrating 1 1  Moving slowly or fidgety/restless 2 2  Suicidal thoughts 0 0  PHQ-9 Score 7 9    Review of Systems  Constitutional: Negative.   HENT: Negative.   Eyes: Negative.   Respiratory: Negative.   Cardiovascular: Positive for leg swelling.  Gastrointestinal: Negative.   Endocrine: Negative.   Genitourinary: Negative.   Musculoskeletal: Positive for arthralgias, back pain and gait problem.  Skin: Negative.   Allergic/Immunologic: Negative.   Neurological: Positive for weakness.       Tingling  Psychiatric/Behavioral: Positive for dysphoric mood. The patient is nervous/anxious.   All other systems reviewed and are negative.      Objective:   Physical Exam Vitals signs and nursing note reviewed.  Constitutional:      Appearance: Normal appearance.  HENT:     Head: Normocephalic and atraumatic.  Eyes:     Extraocular Movements: Extraocular movements intact.     Conjunctiva/sclera: Conjunctivae normal.     Pupils: Pupils are equal, round, and reactive to light.  Skin:    General: Skin is warm and dry.  Neurological:     General: No focal deficit present.     Mental Status: He is alert and oriented to person, place, and time.     Comments: Motor strength is 5/5 in left deltoid bicep tricep grip hip flexion knee extension ankle dorsiflexion  Psychiatric:        Mood and Affect: Mood normal.        Behavior: Behavior normal.     Finger the thumb digits 2 to 4 3- RUE  5/5 RLE except 3- R ankle DF       Assessment & Plan:  #1.  Right hemiparesis secondary to left  paramedian pontine infarct likely small vessel disease. We will continue outpatient therapy at [facility], will see the patient back in approximately 3 months after therapy completed. Follow-up with neurology

## 2019-05-21 DIAGNOSIS — R278 Other lack of coordination: Secondary | ICD-10-CM | POA: Diagnosis not present

## 2019-05-21 DIAGNOSIS — I69392 Facial weakness following cerebral infarction: Secondary | ICD-10-CM | POA: Diagnosis not present

## 2019-05-21 DIAGNOSIS — M25311 Other instability, right shoulder: Secondary | ICD-10-CM | POA: Diagnosis not present

## 2019-05-21 DIAGNOSIS — I69322 Dysarthria following cerebral infarction: Secondary | ICD-10-CM | POA: Diagnosis not present

## 2019-05-21 DIAGNOSIS — M25511 Pain in right shoulder: Secondary | ICD-10-CM | POA: Diagnosis not present

## 2019-05-21 DIAGNOSIS — I69351 Hemiplegia and hemiparesis following cerebral infarction affecting right dominant side: Secondary | ICD-10-CM | POA: Diagnosis not present

## 2019-05-23 DIAGNOSIS — R278 Other lack of coordination: Secondary | ICD-10-CM | POA: Diagnosis not present

## 2019-05-23 DIAGNOSIS — I69322 Dysarthria following cerebral infarction: Secondary | ICD-10-CM | POA: Diagnosis not present

## 2019-05-23 DIAGNOSIS — M25511 Pain in right shoulder: Secondary | ICD-10-CM | POA: Diagnosis not present

## 2019-05-23 DIAGNOSIS — I69392 Facial weakness following cerebral infarction: Secondary | ICD-10-CM | POA: Diagnosis not present

## 2019-05-23 DIAGNOSIS — I69351 Hemiplegia and hemiparesis following cerebral infarction affecting right dominant side: Secondary | ICD-10-CM | POA: Diagnosis not present

## 2019-05-23 DIAGNOSIS — M25311 Other instability, right shoulder: Secondary | ICD-10-CM | POA: Diagnosis not present

## 2019-05-24 ENCOUNTER — Telehealth: Payer: Self-pay | Admitting: Internal Medicine

## 2019-05-24 DIAGNOSIS — Z2821 Immunization not carried out because of patient refusal: Secondary | ICD-10-CM | POA: Diagnosis not present

## 2019-05-24 DIAGNOSIS — K5909 Other constipation: Secondary | ICD-10-CM | POA: Diagnosis not present

## 2019-05-24 DIAGNOSIS — Z8673 Personal history of transient ischemic attack (TIA), and cerebral infarction without residual deficits: Secondary | ICD-10-CM | POA: Diagnosis not present

## 2019-05-24 DIAGNOSIS — I10 Essential (primary) hypertension: Secondary | ICD-10-CM | POA: Diagnosis not present

## 2019-05-24 DIAGNOSIS — E118 Type 2 diabetes mellitus with unspecified complications: Secondary | ICD-10-CM | POA: Diagnosis not present

## 2019-05-24 DIAGNOSIS — I6930 Unspecified sequelae of cerebral infarction: Secondary | ICD-10-CM | POA: Diagnosis not present

## 2019-05-24 DIAGNOSIS — Z6828 Body mass index (BMI) 28.0-28.9, adult: Secondary | ICD-10-CM | POA: Diagnosis not present

## 2019-05-24 DIAGNOSIS — E663 Overweight: Secondary | ICD-10-CM | POA: Diagnosis not present

## 2019-05-24 DIAGNOSIS — E7800 Pure hypercholesterolemia, unspecified: Secondary | ICD-10-CM | POA: Diagnosis not present

## 2019-05-24 NOTE — Telephone Encounter (Signed)
Referring physician's office called and requested the A1C results from patient's initial visit be faxed to them.  Their fax number is [phone]  Att: Dr Truman Hayward

## 2019-05-24 NOTE — Telephone Encounter (Signed)
Verified referring MD information, sent the A1C result to the office

## 2019-05-28 DIAGNOSIS — R278 Other lack of coordination: Secondary | ICD-10-CM | POA: Diagnosis not present

## 2019-05-28 DIAGNOSIS — R2681 Unsteadiness on feet: Secondary | ICD-10-CM | POA: Diagnosis not present

## 2019-05-28 DIAGNOSIS — I69351 Hemiplegia and hemiparesis following cerebral infarction affecting right dominant side: Secondary | ICD-10-CM | POA: Diagnosis not present

## 2019-05-28 DIAGNOSIS — M25311 Other instability, right shoulder: Secondary | ICD-10-CM | POA: Diagnosis not present

## 2019-05-28 DIAGNOSIS — I69322 Dysarthria following cerebral infarction: Secondary | ICD-10-CM | POA: Diagnosis not present

## 2019-05-28 DIAGNOSIS — R2689 Other abnormalities of gait and mobility: Secondary | ICD-10-CM | POA: Diagnosis not present

## 2019-05-28 DIAGNOSIS — M25511 Pain in right shoulder: Secondary | ICD-10-CM | POA: Diagnosis not present

## 2019-05-28 DIAGNOSIS — I69392 Facial weakness following cerebral infarction: Secondary | ICD-10-CM | POA: Diagnosis not present

## 2019-05-30 DIAGNOSIS — M25511 Pain in right shoulder: Secondary | ICD-10-CM | POA: Diagnosis not present

## 2019-05-30 DIAGNOSIS — R2681 Unsteadiness on feet: Secondary | ICD-10-CM | POA: Diagnosis not present

## 2019-05-30 DIAGNOSIS — M25311 Other instability, right shoulder: Secondary | ICD-10-CM | POA: Diagnosis not present

## 2019-05-30 DIAGNOSIS — I69392 Facial weakness following cerebral infarction: Secondary | ICD-10-CM | POA: Diagnosis not present

## 2019-05-30 DIAGNOSIS — I69351 Hemiplegia and hemiparesis following cerebral infarction affecting right dominant side: Secondary | ICD-10-CM | POA: Diagnosis not present

## 2019-05-30 DIAGNOSIS — R2689 Other abnormalities of gait and mobility: Secondary | ICD-10-CM | POA: Diagnosis not present

## 2019-05-30 DIAGNOSIS — I69322 Dysarthria following cerebral infarction: Secondary | ICD-10-CM | POA: Diagnosis not present

## 2019-05-30 DIAGNOSIS — R278 Other lack of coordination: Secondary | ICD-10-CM | POA: Diagnosis not present

## 2019-06-04 DIAGNOSIS — M25311 Other instability, right shoulder: Secondary | ICD-10-CM | POA: Diagnosis not present

## 2019-06-04 DIAGNOSIS — R2681 Unsteadiness on feet: Secondary | ICD-10-CM | POA: Diagnosis not present

## 2019-06-04 DIAGNOSIS — M25511 Pain in right shoulder: Secondary | ICD-10-CM | POA: Diagnosis not present

## 2019-06-04 DIAGNOSIS — R278 Other lack of coordination: Secondary | ICD-10-CM | POA: Diagnosis not present

## 2019-06-04 DIAGNOSIS — I69322 Dysarthria following cerebral infarction: Secondary | ICD-10-CM | POA: Diagnosis not present

## 2019-06-04 DIAGNOSIS — I69351 Hemiplegia and hemiparesis following cerebral infarction affecting right dominant side: Secondary | ICD-10-CM | POA: Diagnosis not present

## 2019-06-04 DIAGNOSIS — I69392 Facial weakness following cerebral infarction: Secondary | ICD-10-CM | POA: Diagnosis not present

## 2019-06-04 DIAGNOSIS — R2689 Other abnormalities of gait and mobility: Secondary | ICD-10-CM | POA: Diagnosis not present

## 2019-06-06 DIAGNOSIS — R278 Other lack of coordination: Secondary | ICD-10-CM | POA: Diagnosis not present

## 2019-06-06 DIAGNOSIS — I69322 Dysarthria following cerebral infarction: Secondary | ICD-10-CM | POA: Diagnosis not present

## 2019-06-06 DIAGNOSIS — M25511 Pain in right shoulder: Secondary | ICD-10-CM | POA: Diagnosis not present

## 2019-06-06 DIAGNOSIS — I69351 Hemiplegia and hemiparesis following cerebral infarction affecting right dominant side: Secondary | ICD-10-CM | POA: Diagnosis not present

## 2019-06-06 DIAGNOSIS — M25311 Other instability, right shoulder: Secondary | ICD-10-CM | POA: Diagnosis not present

## 2019-06-06 DIAGNOSIS — R2689 Other abnormalities of gait and mobility: Secondary | ICD-10-CM | POA: Diagnosis not present

## 2019-06-06 DIAGNOSIS — R2681 Unsteadiness on feet: Secondary | ICD-10-CM | POA: Diagnosis not present

## 2019-06-06 DIAGNOSIS — I69392 Facial weakness following cerebral infarction: Secondary | ICD-10-CM | POA: Diagnosis not present

## 2019-06-10 ENCOUNTER — Encounter: Payer: Self-pay | Admitting: Internal Medicine

## 2019-06-10 ENCOUNTER — Ambulatory Visit (INDEPENDENT_AMBULATORY_CARE_PROVIDER_SITE_OTHER): Payer: Medicare HMO | Admitting: Internal Medicine

## 2019-06-10 VITALS — BP 148/90 | HR 53 | Ht 71.5 in | Wt 203.0 lb

## 2019-06-10 DIAGNOSIS — E785 Hyperlipidemia, unspecified: Secondary | ICD-10-CM | POA: Diagnosis not present

## 2019-06-10 DIAGNOSIS — E663 Overweight: Secondary | ICD-10-CM

## 2019-06-10 DIAGNOSIS — E1165 Type 2 diabetes mellitus with hyperglycemia: Secondary | ICD-10-CM

## 2019-06-10 DIAGNOSIS — E1159 Type 2 diabetes mellitus with other circulatory complications: Secondary | ICD-10-CM | POA: Diagnosis not present

## 2019-06-10 LAB — POCT GLYCOSYLATED HEMOGLOBIN (HGB A1C): Hemoglobin A1C: 6.2 % — AB (ref 4.0–5.6)

## 2019-06-10 NOTE — Patient Instructions (Signed)
Please continue: - Lantus 25 units at bedtime.  Please return in 3 months with your sugar log.

## 2019-06-10 NOTE — Progress Notes (Signed)
Patient ID: Robert Brewer, male   DOB: 01-13-1946, 73 y.o.   MRN: [id number]   HPI: Robert Brewer is a 73 y.o.-year-old male, referred by Cathlyn Parsons, PA-C, for management of DM2, dx in 2009, insulin-dependent since 12/2018, uncontrolled, with complications (stroke [id number]).  His wife, who is also my patient, Robert Brewer, accompanies the patient today and offers more information especially about his diet, medication, and previous medical care. PCP: Cher Nakai, MD, [place]  He has a history of uncontrolled diabetes, for which he refused medicines until he had a stroke 11/2018 >> after this, he was started on insulin.  Reviewed latest HbA1c level: Lab Results  Component Value Date   HGBA1C 6.5 (A) 03/19/2019   HGBA1C 13.3 (H) 12/17/2018   Pt is on a regimen of: - Lantus 25 units at bedtime  -start 12/2018 He was on JanuMet in the past, but not in last 3 years.  Pt checks his sugars once a day once a day: - am: 90, 104-125, 144 >> 74-142, 154 - 2h after b'fast: n/c >> 133 - before lunch: n/c >> 100-115 - 2h after lunch: n/c - before dinner: n/c - 2h after dinner: n/c >> 163 - bedtime: n/c >> 132, 149 - nighttime: n/c Lowest sugar was 66 in the hospital >> 74; he has hypoglycemia awareness at 70.  Highest sugar was 144 >> 269 (forgot insulin).  Glucometer: ReliOn  Pt's meals are: - Breakfast: whole wheat cereal with milk, sometimes egg + toast + bacon; oatmeal or grits - Lunch: none - Dinner: steak + potatoes + veggies; pizza; salad + mayo - Snacks: PB sandwich + milk He was drinking a lot of regular sodas and milk before the stroke, also eating a lot of icecream.  He stopped these after his stroke.  He is in physical therapy twice a week.  - No CKD, last BUN/creatinine:  Lab Results  Component Value Date   BUN 20 12/25/2018   BUN 19 12/17/2018   CREATININE 0.78 12/25/2018   CREATININE 0.79 12/17/2018  Not on an ACE inhibitor/ARB.  -+ HL; last set of lipids: Lab  Results  Component Value Date   CHOL 227 (H) 12/17/2018   HDL 45 12/17/2018   LDLCALC 166 (H) 12/17/2018   TRIG 81 12/17/2018   CHOLHDL 5.0 12/17/2018  At last visit was not on a statin-stopped as his LDL was 60.  At last visit I strongly advised him to restart it.   - last eye exam was in 06/2018: No DR  - No numbness and tingling in his feet.  Pt has FH of DM in brother.  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   ROS: Constitutional: no weight gain/no weight loss, no fatigue, no subjective hyperthermia, no subjective hypothermia Eyes: no blurry vision, no xerophthalmia ENT: no sore throat, no nodules palpated in neck, no dysphagia, no odynophagia, no hoarseness Cardiovascular: no CP/no SOB/no palpitations/no leg swelling Respiratory: no cough/no SOB/no wheezing Gastrointestinal: no N/no V/no D/no C/no acid reflux Musculoskeletal: no muscle aches/no joint aches Skin: no rashes, no hair loss Neurological: no tremors/no numbness/no tingling/no dizziness/+ R hemiparesis/+ dysarthria  I reviewed pt's medications, allergies, PMH, social hx, family hx, and changes were documented in the history of present illness. Otherwise, unchanged from my initial visit note.  No past medical history on file.   No past surgical history on file.   Social  History   Socioeconomic History  . Marital status: Married    Spouse name: Not on file  . Number of children: 0  . Years of education: Not on file  . Highest education level: Not on file  Occupational History  .  Retired  Scientific laboratory technician  . Financial resource strain: Not on file  . Food insecurity    Worry: Not on file    Inability: Not on file  . Transportation needs    Medical: Not on file    Non-medical: Not on file  Tobacco Use  . Smoking status: Never Smoker  . Smokeless tobacco: Never Used  Substance and Sexual Activity   . Alcohol use:  Beer    Frequency:  Rarely  . Drug use: No   Current Outpatient Medications on File Prior to Visit  Medication Sig Dispense Refill  . acetaminophen (TYLENOL) 500 MG tablet Take 1,000 mg by mouth daily.    Marland Kitchen aspirin EC 81 MG EC tablet Take 1 tablet (81 mg total) by mouth daily.    . Insulin Glargine (LANTUS) 100 UNIT/ML Solostar Pen Inject 25 Units into the skin daily. 15 mL 11   No current facility-administered medications on file prior to visit.    No Known Allergies Family History  Problem Relation Age of Onset  . Other Mother        healthy, lived to her 27's  . Prostate cancer Father        lived to his 69's  . Congestive Heart Failure Father   . Valvular heart disease Sister   . Prostate cancer Brother   + See HPI  PE: BP (!) 148/90   Pulse (!) 53   Ht 5' 11.5" (1.816 m)   Wt 203 lb (92.1 kg)   SpO2 97%   BMI 27.92 kg/m  Wt Readings from Last 3 Encounters:  06/10/19 203 lb (92.1 kg)  05/20/19 205 lb (93 kg)  03/22/19 200 lb 9.6 oz (91 kg)   Constitutional: overweight, in NAD, walks with a walker Eyes: PERRLA, EOMI, no exophthalmos ENT: moist mucous membranes, no thyromegaly, no cervical lymphadenopathy Cardiovascular: RRR, No MRG, + swelling in right leg and right hand Respiratory: CTA B Gastrointestinal: abdomen soft, NT, ND, BS+ Musculoskeletal: no deformities, strength intact in all 4 Skin: moist, warm, no rashes Neurological: no tremor with outstretched hands, DTR normal in all 4  ASSESSMENT: 1. 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 HL  3.  Overweight  PLAN:  1. Patient with longstanding, uncontrolled, diabetes, with a history of recent stroke after which he continues to have paresis on the right side of his body.  The stroke was probably due to both uncontrolled diabetes and also control blood pressure and cholesterol  levels.  His wife reports at last visit, he accepted treatment for his diabetes after his stroke.  When I saw him last, HbA1c.  He was checking blood sugars once a day, in the morning, but we discussed starting to check later in the day, also, rotating check time.  I did not change his regimen at last visit but we discussed about possible introducing a GLP-1 receptor agonist or an SGLT2 inhibitor at this visit.  Of note, he had an extremely poor diet before his stroke but he did not want to afterwards eliminating sweet drinks, ice cream, and other sweets. They refused a referral to nutrition at last OV. -At this  visit, sugars remain well controlled on his current very minimalistic regimen with only Lantus.  In fact, his HbA1c is  6.2% (better) -For now, we will not change his regimen, especially since he has no hyper or hypoglycemia - I suggested to:  Patient Instructions  Please continue: - Lantus 25 units at bedtime.  Please return in 3 months with your sugar log.   - advised to check sugars at different times of the day - 1x a day, rotating check times - advised for yearly eye exams >> he is UTD and has an appointment scheduled for 06/2019 - refuses flu shot - return to clinic in 3 months  2. HL - Reviewed latest lipid panel from 11/2018: LDL from 11/2018: LDL very high (target less than 80) Lab Results  Component Value Date   CHOL 227 (H) 12/17/2018   HDL 45 12/17/2018   LDLCALC 166 (H) 12/17/2018   TRIG 81 12/17/2018   CHOLHDL 5.0 12/17/2018  -At last visit, his wife was not immediately finally accepted treatment for his hyperlipidemia after his stroke in 11/2018, but he was off statin at last visit.  I explained then the many benefits of a statin beyond lowering cholesterol levels and strongly advised him to restart it. At this visit, he tells me that he had a repeat Lipid panel 3 weeks ago >> LDL 111 (I will need the records) - he refuses to start a statin.  3.  Overweight -No  significant weight loss since last visit -He is exercising twice a week >> advised to continue exercise   Philemon Kingdom, MD PhD [facility]

## 2019-06-10 NOTE — Addendum Note (Signed)
Addended by: Cardell Peach I on: 06/10/2019 03:00 PM   Modules accepted: Orders

## 2019-06-11 DIAGNOSIS — I69351 Hemiplegia and hemiparesis following cerebral infarction affecting right dominant side: Secondary | ICD-10-CM | POA: Diagnosis not present

## 2019-06-11 DIAGNOSIS — I69392 Facial weakness following cerebral infarction: Secondary | ICD-10-CM | POA: Diagnosis not present

## 2019-06-11 DIAGNOSIS — M25511 Pain in right shoulder: Secondary | ICD-10-CM | POA: Diagnosis not present

## 2019-06-11 DIAGNOSIS — R2681 Unsteadiness on feet: Secondary | ICD-10-CM | POA: Diagnosis not present

## 2019-06-11 DIAGNOSIS — R2689 Other abnormalities of gait and mobility: Secondary | ICD-10-CM | POA: Diagnosis not present

## 2019-06-11 DIAGNOSIS — I69322 Dysarthria following cerebral infarction: Secondary | ICD-10-CM | POA: Diagnosis not present

## 2019-06-11 DIAGNOSIS — M25311 Other instability, right shoulder: Secondary | ICD-10-CM | POA: Diagnosis not present

## 2019-06-11 DIAGNOSIS — R278 Other lack of coordination: Secondary | ICD-10-CM | POA: Diagnosis not present

## 2019-06-13 DIAGNOSIS — R2689 Other abnormalities of gait and mobility: Secondary | ICD-10-CM | POA: Diagnosis not present

## 2019-06-13 DIAGNOSIS — I69392 Facial weakness following cerebral infarction: Secondary | ICD-10-CM | POA: Diagnosis not present

## 2019-06-13 DIAGNOSIS — M25511 Pain in right shoulder: Secondary | ICD-10-CM | POA: Diagnosis not present

## 2019-06-13 DIAGNOSIS — R278 Other lack of coordination: Secondary | ICD-10-CM | POA: Diagnosis not present

## 2019-06-13 DIAGNOSIS — I69322 Dysarthria following cerebral infarction: Secondary | ICD-10-CM | POA: Diagnosis not present

## 2019-06-13 DIAGNOSIS — R2681 Unsteadiness on feet: Secondary | ICD-10-CM | POA: Diagnosis not present

## 2019-06-13 DIAGNOSIS — I69351 Hemiplegia and hemiparesis following cerebral infarction affecting right dominant side: Secondary | ICD-10-CM | POA: Diagnosis not present

## 2019-06-13 DIAGNOSIS — M25311 Other instability, right shoulder: Secondary | ICD-10-CM | POA: Diagnosis not present

## 2019-06-18 DIAGNOSIS — R278 Other lack of coordination: Secondary | ICD-10-CM | POA: Diagnosis not present

## 2019-06-18 DIAGNOSIS — I69322 Dysarthria following cerebral infarction: Secondary | ICD-10-CM | POA: Diagnosis not present

## 2019-06-18 DIAGNOSIS — M25311 Other instability, right shoulder: Secondary | ICD-10-CM | POA: Diagnosis not present

## 2019-06-18 DIAGNOSIS — R2681 Unsteadiness on feet: Secondary | ICD-10-CM | POA: Diagnosis not present

## 2019-06-18 DIAGNOSIS — M25511 Pain in right shoulder: Secondary | ICD-10-CM | POA: Diagnosis not present

## 2019-06-18 DIAGNOSIS — I69392 Facial weakness following cerebral infarction: Secondary | ICD-10-CM | POA: Diagnosis not present

## 2019-06-18 DIAGNOSIS — I69351 Hemiplegia and hemiparesis following cerebral infarction affecting right dominant side: Secondary | ICD-10-CM | POA: Diagnosis not present

## 2019-06-18 DIAGNOSIS — R2689 Other abnormalities of gait and mobility: Secondary | ICD-10-CM | POA: Diagnosis not present

## 2019-06-19 ENCOUNTER — Ambulatory Visit (INDEPENDENT_AMBULATORY_CARE_PROVIDER_SITE_OTHER): Payer: Medicare HMO | Admitting: Adult Health

## 2019-06-19 ENCOUNTER — Other Ambulatory Visit: Payer: Self-pay

## 2019-06-19 ENCOUNTER — Encounter: Payer: Self-pay | Admitting: Adult Health

## 2019-06-19 VITALS — BP 142/62 | HR 71 | Temp 97.3°F | Ht 71.5 in | Wt 208.6 lb

## 2019-06-19 DIAGNOSIS — Z794 Long term (current) use of insulin: Secondary | ICD-10-CM

## 2019-06-19 DIAGNOSIS — G8191 Hemiplegia, unspecified affecting right dominant side: Secondary | ICD-10-CM

## 2019-06-19 DIAGNOSIS — I10 Essential (primary) hypertension: Secondary | ICD-10-CM | POA: Diagnosis not present

## 2019-06-19 DIAGNOSIS — I6330 Cerebral infarction due to thrombosis of unspecified cerebral artery: Secondary | ICD-10-CM

## 2019-06-19 DIAGNOSIS — E1149 Type 2 diabetes mellitus with other diabetic neurological complication: Secondary | ICD-10-CM

## 2019-06-19 DIAGNOSIS — R471 Dysarthria and anarthria: Secondary | ICD-10-CM

## 2019-06-19 DIAGNOSIS — E785 Hyperlipidemia, unspecified: Secondary | ICD-10-CM

## 2019-06-19 NOTE — Patient Instructions (Signed)
Continue aspirin 81 mg daily for secondary stroke prevention  Continue to follow up with PCP regarding cholesterol and blood pressure management   Continue to follow with endocrinology for diabetic management -keep up the good work!!  Continue to work with outpatient physical and occupational therapy and highly consider initiating speech therapy   Discussed with Dr. Letta Pate regarding potential use of shoulder injection for ongoing pain limiting movement and interfering with recovery  Continue to monitor blood pressure at home  Maintain strict control of hypertension with blood pressure goal below 130/90, diabetes with hemoglobin A1c goal below 6.5% and cholesterol with LDL cholesterol (bad cholesterol) goal below 70 mg/dL. I also advised the patient to eat a healthy diet with plenty of whole grains, cereals, fruits and vegetables, exercise regularly and maintain ideal body weight.  Followup in the future with me in 6 months or call earlier if needed       Thank you for coming to see [place] at [facility]. I hope we have been able to provide you high quality care today.  You may receive a patient satisfaction survey over the next few weeks. We would appreciate your feedback and comments so that we may continue to improve ourselves and the health of our patients.

## 2019-06-19 NOTE — Progress Notes (Signed)
[facility] [address] [place]. [place] [address] [phone]       HOSPITAL FOLLOW UP NOTE  [name] Date of Birth:  Jun 29, 1946 Medical Record Number:  [id number]   Reason for Referral:  hospital stroke follow up    CHIEF COMPLAINT:  Chief Complaint  Patient presents with   Follow-up    3 mon f/u. Wife present. Rm 9. Patient mentioned that he would like to discuss how he lifts his right arm and what OT told him.     HPI: Stroke admission 12/17/2018: Mr.Robert Brewer a 73 y.o.malewith history of diabetes mellitus, HTN, not compliant with medications who presented on 12/17/2018 with right sided weakness and facial droop. tPA Given at [facility] transferred to [facility].  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  He declined further cardioembolic work-up.  2D echo unremarkable.  Initiated aspirin 81 mg daily.  Did not recommend DAPT as he is resistant to Western medicine.  HTN stable.  LDL 166 but patient refused statin.  A1c 13.3 and recommended follow-up with PCP for uncontrolled DM.  Other stroke risk factors include advanced age but no prior history of stroke.  Other active problems include noncompliance with medications with resistance to Western medicine.  Residual deficits mild dysarthria, right lower facial weakness, and right hemiparesis therefore discharged to CIR for ongoing therapy.  Initial visit 03/13/2019: He is being seen today for hospital follow-up accompanied by his wife.  Residual deficits of right hemiparesis, right facial droop, dysarthria and short-term memory concerns.  He continues to work with physical and occupational therapies at [facility] with ongoing improvement.  Continues to ambulate with rolling walker and  unfortunately has had a few falls but no serious injury.  Wife endorses occasional difficulty with swallowing as he will attempt to put too much food in his mouth at once which can cause coughing or choking.  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 he has not been followed by speech therapy.  He continues on aspirin 81 mg without bleeding or bruising.  Blood pressure today 121/68.  No further concerns at this time.  Update 06/19/2019: [name] is a 73 year old male who is being seen today for stroke follow-up accompianed by his wife.  He continues to have deficits of right hemiparesis, dysarthria and right-sided facial weakness.  His wife also endorses occasional difficulty with swallowing as discussed at prior visit.  He continues to work with PT/OT at [facility] with ongoing improvement.  Previously refused speech therapy.  Continues to ambulate with cane.  He does endorse right shoulder pain which has been interfering with therapy.  He has follow-up with physical medicine and rehab next month.  His wife endorses short-term memory impairment but has been stable.  He continues on aspirin without bleeding or bruising.  Blood pressure today initially elevated and on recheck 142/62.  He does not routinely monitor at home.  He has established care with endocrinologist and reports recent A1c 6.2.  It was recommended to pursue additional cardiac monitoring with cardiac event monitor to assess for potential atrial fibrillation but continues to refuse.  Refuses statin therapy.  Patient questions possible use of ED medication as he was advised to his PCP to discuss further during today's  visit regards to a neurological/stroke standpoint.  No further concerns at this time.     ROS:   14 system review of systems performed and negative with exception of fatigue, trouble swallowing,  memory  loss, weakness, slurred speech, sleepiness, snoring, depression, too much sleep and decreased energy  PMH: History reviewed. No pertinent past medical history.  PSH: History reviewed. No pertinent surgical history.  Social History:  Social History   Socioeconomic History   Marital status: Married    Spouse name: Not on file   Number of children: Not on file   Years of education: Not on file   Highest education level: Not on file  Occupational History   Not on file  Social Needs   Financial resource strain: Not on file   Food insecurity    Worry: Not on file    Inability: Not on file   Transportation needs    Medical: Not on file    Non-medical: Not on file  Tobacco Use   Smoking status: Never Smoker   Smokeless tobacco: Never Used  Substance and Sexual Activity   Alcohol use: Never    Frequency: Never   Drug use: Not on file   Sexual activity: Not on file  Lifestyle   Physical activity    Days per week: Not on file    Minutes per session: Not on file   Stress: Not on file  Relationships   Social connections    Talks on phone: Not on file    Gets together: Not on file    Attends religious service: Not on file    Active member of club or organization: Not on file    Attends meetings of clubs or organizations: Not on file    Relationship status: Not on file   Intimate partner violence    Fear of current or ex partner: Not on file    Emotionally abused: Not on file    Physically abused: Not on file    Forced sexual activity: Not on file  Other Topics Concern   Not on file  Social History Narrative   Not on file    Family History:  Family History  Problem Relation Age of Onset   Other Mother        healthy, lived to her 74's   Prostate cancer Father        lived to his 52's   Congestive Heart Failure Father    Valvular heart disease Sister    Prostate cancer Brother     Medications:   Current Outpatient Medications on File Prior  to Visit  Medication Sig Dispense Refill   acetaminophen (TYLENOL) 500 MG tablet Take 1,000 mg by mouth daily.     aspirin EC 81 MG EC tablet Take 1 tablet (81 mg total) by mouth daily.     Insulin Glargine (LANTUS) 100 UNIT/ML Solostar Pen Inject 25 Units into the skin daily. 15 mL 11   No current facility-administered medications on file prior to visit.     Allergies:  No Known Allergies   Physical Exam  Vitals:   06/19/19 1527  BP: (!) 150/73  Pulse: 71  Temp: (!) 97.3 F (36.3 C)  TempSrc: Oral  Weight: 208 lb 9.6 oz (94.6 kg)  Height: 5' 11.5" (1.816 m)   Body mass index is 28.69 kg/m. No exam data present   General: well developed, well nourished,  elderly Caucasian male, seated, in no evident distress Head: head normocephalic and atraumatic.  Neck: supple with no carotid or supraclavicular bruits Cardiovascular: irregular regular rate (ventricular bigeminy on telemetry monitoring during admission) and rhythm, no murmurs Musculoskeletal: no deformity Skin:  no rash/petichiae Vascular:  Normal pulses all extremities   Neurologic Exam Mental Status: Awake and fully alert. Mild dysarthria. Oriented to place and time. Recent and remote memory intact. Attention span, concentration and fund of knowledge appropriate.  Flat affect with minimal eye contact but cooperative with history taking exam. Cranial Nerves: Fundoscopic exam reveals sharp disc margins. Pupils equal, briskly reactive to light. Extraocular movements full without nystagmus. Visual fields full to confrontation. Hearing intact. Facial sensation intact. Right facial droop.  Motor:  RUE: 3/5 with weak grip strength and decreased deltoid ROM due to subjective pain RLE: 4+/5 greatest in hip flexor and ankle dorsiflexion  LUE: full strength LLE: full strength  Sensory.: intact to touch , pinprick , position and vibratory sensation.  Coordination: Rapid alternating movements normal in all extremities except  decreased right hand dexterity. Finger-to-nose and heel-to-shin performed accurately bilaterally. Gait and Station: Arises from chair with mild difficulty. Stance is normal. Gait demonstrates hemiplegic and steppage gait gait with use of quad cane Reflexes: 1+ and symmetric. Toes downgoing.         Diagnostic Data (Labs, Imaging, Testing)   CT HEAD 12/17/2018 IMPRESSION:  1. Subtle approximate 1 cm hypodensity involving the left paramedian pons, increased in prominence from previous, and suspicious for evolving acute ischemic infarct. 2. Otherwise stable appearance of the head with associated age-related cerebral atrophy. No other acute abnormality identified.   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 No significant intraluminal thrombus or raised dissection flap identified.  2. No large vessel occlusion. No other hemodynamically significant or correctable stenosis.   MRI Wo Contrast  12/17/2018 IMPRESSION:  acute infarct involving left paramedian pons, left occipital lobe.  CXR- normal at OSH  Transthoracic Echocardiogram w/ bubble study Normal ejection fraction of 60 to 65%. Mild thickening of aortic valve with calcification  EKG - SB at OSH     ASSESSMENT: Robert Brewer is a 73 y.o. 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  Patient declined further cardioembolic work-up.  Vascular risk factors include DM, HTN, HLD and medication noncompliance.  Residual deficits of right hemiparesis, likely mild dysphagia and dysarthria with ongoing improvement.       PLAN:  1. Embolic stroke: Continue aspirin 81 mg daily for secondary stroke prevention.  Discussion with patient regarding undergoing 30-day cardiac event monitor to rule out atrial fibrillation as potential cause of stroke but continues to refuse additional monitoring. refuses statin therapy for secondary stroke prevention.  Maintain strict control of hypertension with blood pressure goal below 130/90, diabetes with hemoglobin A1c goal below 6.5% and cholesterol with LDL cholesterol (bad cholesterol) goal below 70 mg/dL.  I also advised the patient to eat a healthy diet with plenty of whole grains, cereals, fruits and vegetables, exercise regularly with at least 30 minutes of continuous activity daily and maintain ideal body weight. 2. Residual deficits: Continue to work with [facility] PT/OT for ongoing improvement.  Advised to discuss right shoulder pain with Dr. Letta Pate with  potential benefit with injections.  Again recommended speech therapy evaluation/therapy but declines at this time and advised to call office in future if interested 3. HTN: Advised to continue current treatment regimen.  Advised to continue to monitor at home along with continued follow-up with PCP for management 4. HLD: Patient refuses statin use.  Continue to follow with PCP for ongoing monitoring of levels 5. DMII: Advised to continue to monitor glucose levels at home along with continued follow-up with endocrinology for management and monitoring 6. ED: Patient questions use of ED medication in regards to stroke.  Blood pressure slightly elevated at today's visit and advised to monitor at home.  He is 6 months out from his stroke at this time and advised him that if blood pressure is well controlled at home, no further concerns from a stroke standpoint regarding use of occasional ED medication therapy    Follow up in 6 months or call earlier if needed   Greater than 50% of time during this 25 minute visit  was spent on counseling, explanation of diagnosis of embolic stroke, reviewing risk factor management of HTN, HLD and DM, planning of further management along with potential future management, and discussion with patient and family answering all questions.    Frann Rider, AGNP-BC  [facility] [address] [place], [place] [address]  Phone [phone] Fax [phone] Note: This document was prepared with digital dictation and possible smart phrase technology. Any transcriptional errors that result from this process are unintentional.

## 2019-06-24 DIAGNOSIS — K5909 Other constipation: Secondary | ICD-10-CM | POA: Diagnosis not present

## 2019-06-24 DIAGNOSIS — I10 Essential (primary) hypertension: Secondary | ICD-10-CM | POA: Diagnosis not present

## 2019-06-24 DIAGNOSIS — I6930 Unspecified sequelae of cerebral infarction: Secondary | ICD-10-CM | POA: Diagnosis not present

## 2019-06-24 DIAGNOSIS — E663 Overweight: Secondary | ICD-10-CM | POA: Diagnosis not present

## 2019-06-24 DIAGNOSIS — E118 Type 2 diabetes mellitus with unspecified complications: Secondary | ICD-10-CM | POA: Diagnosis not present

## 2019-06-24 DIAGNOSIS — Z8673 Personal history of transient ischemic attack (TIA), and cerebral infarction without residual deficits: Secondary | ICD-10-CM | POA: Diagnosis not present

## 2019-06-24 DIAGNOSIS — Z2821 Immunization not carried out because of patient refusal: Secondary | ICD-10-CM | POA: Diagnosis not present

## 2019-06-24 DIAGNOSIS — Z6828 Body mass index (BMI) 28.0-28.9, adult: Secondary | ICD-10-CM | POA: Diagnosis not present

## 2019-06-24 DIAGNOSIS — E7800 Pure hypercholesterolemia, unspecified: Secondary | ICD-10-CM | POA: Diagnosis not present

## 2019-06-25 DIAGNOSIS — R278 Other lack of coordination: Secondary | ICD-10-CM | POA: Diagnosis not present

## 2019-06-25 DIAGNOSIS — I69351 Hemiplegia and hemiparesis following cerebral infarction affecting right dominant side: Secondary | ICD-10-CM | POA: Diagnosis not present

## 2019-06-25 DIAGNOSIS — M25511 Pain in right shoulder: Secondary | ICD-10-CM | POA: Diagnosis not present

## 2019-06-25 DIAGNOSIS — M25311 Other instability, right shoulder: Secondary | ICD-10-CM | POA: Diagnosis not present

## 2019-06-25 DIAGNOSIS — R2681 Unsteadiness on feet: Secondary | ICD-10-CM | POA: Diagnosis not present

## 2019-06-25 DIAGNOSIS — I69392 Facial weakness following cerebral infarction: Secondary | ICD-10-CM | POA: Diagnosis not present

## 2019-06-25 DIAGNOSIS — I69322 Dysarthria following cerebral infarction: Secondary | ICD-10-CM | POA: Diagnosis not present

## 2019-06-27 DIAGNOSIS — M25311 Other instability, right shoulder: Secondary | ICD-10-CM | POA: Diagnosis not present

## 2019-06-27 DIAGNOSIS — I69392 Facial weakness following cerebral infarction: Secondary | ICD-10-CM | POA: Diagnosis not present

## 2019-06-27 DIAGNOSIS — M25511 Pain in right shoulder: Secondary | ICD-10-CM | POA: Diagnosis not present

## 2019-06-27 DIAGNOSIS — R278 Other lack of coordination: Secondary | ICD-10-CM | POA: Diagnosis not present

## 2019-06-27 DIAGNOSIS — I69322 Dysarthria following cerebral infarction: Secondary | ICD-10-CM | POA: Diagnosis not present

## 2019-06-27 DIAGNOSIS — I69351 Hemiplegia and hemiparesis following cerebral infarction affecting right dominant side: Secondary | ICD-10-CM | POA: Diagnosis not present

## 2019-06-27 DIAGNOSIS — R2681 Unsteadiness on feet: Secondary | ICD-10-CM | POA: Diagnosis not present

## 2019-07-02 DIAGNOSIS — I69392 Facial weakness following cerebral infarction: Secondary | ICD-10-CM | POA: Diagnosis not present

## 2019-07-02 DIAGNOSIS — I69322 Dysarthria following cerebral infarction: Secondary | ICD-10-CM | POA: Diagnosis not present

## 2019-07-02 DIAGNOSIS — M25311 Other instability, right shoulder: Secondary | ICD-10-CM | POA: Diagnosis not present

## 2019-07-02 DIAGNOSIS — M25511 Pain in right shoulder: Secondary | ICD-10-CM | POA: Diagnosis not present

## 2019-07-02 DIAGNOSIS — R278 Other lack of coordination: Secondary | ICD-10-CM | POA: Diagnosis not present

## 2019-07-02 DIAGNOSIS — R2681 Unsteadiness on feet: Secondary | ICD-10-CM | POA: Diagnosis not present

## 2019-07-02 DIAGNOSIS — I69351 Hemiplegia and hemiparesis following cerebral infarction affecting right dominant side: Secondary | ICD-10-CM | POA: Diagnosis not present

## 2019-07-04 DIAGNOSIS — I69392 Facial weakness following cerebral infarction: Secondary | ICD-10-CM | POA: Diagnosis not present

## 2019-07-04 DIAGNOSIS — M25311 Other instability, right shoulder: Secondary | ICD-10-CM | POA: Diagnosis not present

## 2019-07-04 DIAGNOSIS — I69351 Hemiplegia and hemiparesis following cerebral infarction affecting right dominant side: Secondary | ICD-10-CM | POA: Diagnosis not present

## 2019-07-04 DIAGNOSIS — R278 Other lack of coordination: Secondary | ICD-10-CM | POA: Diagnosis not present

## 2019-07-04 DIAGNOSIS — R2681 Unsteadiness on feet: Secondary | ICD-10-CM | POA: Diagnosis not present

## 2019-07-04 DIAGNOSIS — M25511 Pain in right shoulder: Secondary | ICD-10-CM | POA: Diagnosis not present

## 2019-07-04 DIAGNOSIS — I69322 Dysarthria following cerebral infarction: Secondary | ICD-10-CM | POA: Diagnosis not present

## 2019-07-09 DIAGNOSIS — M25511 Pain in right shoulder: Secondary | ICD-10-CM | POA: Diagnosis not present

## 2019-07-09 DIAGNOSIS — I69392 Facial weakness following cerebral infarction: Secondary | ICD-10-CM | POA: Diagnosis not present

## 2019-07-09 DIAGNOSIS — I69322 Dysarthria following cerebral infarction: Secondary | ICD-10-CM | POA: Diagnosis not present

## 2019-07-09 DIAGNOSIS — I69351 Hemiplegia and hemiparesis following cerebral infarction affecting right dominant side: Secondary | ICD-10-CM | POA: Diagnosis not present

## 2019-07-09 DIAGNOSIS — R2681 Unsteadiness on feet: Secondary | ICD-10-CM | POA: Diagnosis not present

## 2019-07-09 DIAGNOSIS — R278 Other lack of coordination: Secondary | ICD-10-CM | POA: Diagnosis not present

## 2019-07-09 DIAGNOSIS — M25311 Other instability, right shoulder: Secondary | ICD-10-CM | POA: Diagnosis not present

## 2019-07-11 DIAGNOSIS — I69392 Facial weakness following cerebral infarction: Secondary | ICD-10-CM | POA: Diagnosis not present

## 2019-07-11 DIAGNOSIS — M25511 Pain in right shoulder: Secondary | ICD-10-CM | POA: Diagnosis not present

## 2019-07-11 DIAGNOSIS — M25311 Other instability, right shoulder: Secondary | ICD-10-CM | POA: Diagnosis not present

## 2019-07-11 DIAGNOSIS — I69322 Dysarthria following cerebral infarction: Secondary | ICD-10-CM | POA: Diagnosis not present

## 2019-07-11 DIAGNOSIS — R278 Other lack of coordination: Secondary | ICD-10-CM | POA: Diagnosis not present

## 2019-07-11 DIAGNOSIS — R2681 Unsteadiness on feet: Secondary | ICD-10-CM | POA: Diagnosis not present

## 2019-07-11 DIAGNOSIS — I69351 Hemiplegia and hemiparesis following cerebral infarction affecting right dominant side: Secondary | ICD-10-CM | POA: Diagnosis not present

## 2019-07-16 DIAGNOSIS — M25511 Pain in right shoulder: Secondary | ICD-10-CM | POA: Diagnosis not present

## 2019-07-16 DIAGNOSIS — M25311 Other instability, right shoulder: Secondary | ICD-10-CM | POA: Diagnosis not present

## 2019-07-16 DIAGNOSIS — I69351 Hemiplegia and hemiparesis following cerebral infarction affecting right dominant side: Secondary | ICD-10-CM | POA: Diagnosis not present

## 2019-07-16 DIAGNOSIS — R2681 Unsteadiness on feet: Secondary | ICD-10-CM | POA: Diagnosis not present

## 2019-07-16 DIAGNOSIS — R278 Other lack of coordination: Secondary | ICD-10-CM | POA: Diagnosis not present

## 2019-07-16 DIAGNOSIS — I69322 Dysarthria following cerebral infarction: Secondary | ICD-10-CM | POA: Diagnosis not present

## 2019-07-16 DIAGNOSIS — I69392 Facial weakness following cerebral infarction: Secondary | ICD-10-CM | POA: Diagnosis not present

## 2019-07-23 DIAGNOSIS — M25511 Pain in right shoulder: Secondary | ICD-10-CM | POA: Diagnosis not present

## 2019-07-23 DIAGNOSIS — I69351 Hemiplegia and hemiparesis following cerebral infarction affecting right dominant side: Secondary | ICD-10-CM | POA: Diagnosis not present

## 2019-07-23 DIAGNOSIS — M25311 Other instability, right shoulder: Secondary | ICD-10-CM | POA: Diagnosis not present

## 2019-07-23 DIAGNOSIS — I69322 Dysarthria following cerebral infarction: Secondary | ICD-10-CM | POA: Diagnosis not present

## 2019-07-23 DIAGNOSIS — I69392 Facial weakness following cerebral infarction: Secondary | ICD-10-CM | POA: Diagnosis not present

## 2019-07-23 DIAGNOSIS — R2681 Unsteadiness on feet: Secondary | ICD-10-CM | POA: Diagnosis not present

## 2019-07-23 DIAGNOSIS — R278 Other lack of coordination: Secondary | ICD-10-CM | POA: Diagnosis not present

## 2019-07-23 NOTE — Progress Notes (Signed)
Cardiology Office Note:    Date:  07/24/2019   ID:  Robert Brewer, DOB [birth year], MRN [id number]  PCP:  Robert Nakai, MD  Cardiologist:  Robert More, MD    Referring MD: Robert Nakai, MD    ASSESSMENT:    1. Cerebrovascular accident (CVA), unspecified mechanism ([place])   2. Type 2 diabetes mellitus with other neurologic complication, with long-term current use of insulin ([place])   3. Hyperlipidemia LDL goal <70   4. Elevated blood pressure reading    PLAN:    In order of problems listed above:  1. He has had a cryptic stroke scenario very suggestive of embolic related to atrial fibrillation he would benefit from a loop recorder declined an implanted and will consider using an external device.  I will do everything I can to help them if they decide to proceed.  For now continue antiplatelet therapy 2. Very well controlled with the aid of endocrinology A1c at target 3. Poorly controlled he has severe dyslipidemia at baseline and regardless of any numbers at this time he should be taking a statin for class effect 4. Improved currently not on antihypertensives   Next appointment: 6 months   Medication Adjustments/Labs and Tests Ordered: Current medicines are reviewed at length with the patient today.  Concerns regarding medicines are outlined above.  No orders of the defined types were placed in this encounter.  No orders of the defined types were placed in this encounter.   No chief complaint on file.   History of Present Illness:    Robert Brewer is a 73 y.o. male with a hx of DM hypertension and  cryptogenic stroke who presented on 12/17/2018 with right sided weakness and facial droop. tPA Given at [facility] and transferred to [facility].  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  He  declined further cardioembolic work-up.  He was seen Robert Brewer EP 12/20/2018 at that time he mentioned there is no arrhythmia noticed on telemetry advised implanted loop recorder patient declined.  History is noteworthy for type 2 diabetes insulin-dependent He was last seen 12/20/2018.   Echo with agitated saline contrast 12/17/2018:  1. The left ventricle has normal systolic function with an ejection fraction of 60-65%. The cavity size was normal. Left ventricular diastolic Doppler parameters are consistent with impaired relaxation. No evidence of left ventricular regional wall  motion abnormalities.  2. The right ventricle has normal systolic function. The cavity was normal. There is no increase in right ventricular wall thickness.  3. Left atrial size was mildly dilated.  4. Mild thickening of the aortic valve. Mild calcification of the aortic valve. Aortic valve regurgitation was not assessed by color flow Doppler.  5. Negative bubble study.  6. No intracardiac thrombi or masses were visualized.  Prior to the visit I reviewed [facility] records Right upper extremity venous duplex 02/08/2019 normal no thrombophlebitis Chest x-ray 12/16/2018 normal, EKG sinus bradycardia 53 bpm otherwise normal CBC was normal hemoglobin 13.4 CMP was normal creatinine 0.7 GFR greater than 60 cc and normal liver function test.  CT of the head at [facility] and CTA which showed basilar artery embolism.  He received TPA.  He feels that he has made a good recovery his speech is better he has some function on the right and he can ambulate  with a cane and uses a wheelchair.  I tried my best to convince him to take a statin even a low-dose of a low intensity statin a few days a week and he just will not agree.  Fortunately his diabetes is not well controlled and his blood pressure is normal.  He is taking antiplatelet therapy with aspirin.  His clinical scenario was very suggestive of atrial fibrillation  he has declined an implanted loop recorder and asked him to consider an external loop recorder a new device called LivMor Halo working as a Proofreader watch with a Samsung tablet with high sensitivity and specificity for atrial fibrillation they will contact their insurance and give consideration.  We will do an EKG prior to leaving the office.  He has had no palpitations syncope recurrent neurologic deficit edema chest pain palpitation or syncope.  Past Medical History:  Diagnosis Date  . Stroke (cerebrum) ([place])     No past surgical history on file.  Current Medications: Current Meds  Medication Sig  . acetaminophen (TYLENOL) 500 MG tablet Take 1,000 mg by mouth daily.  Marland Kitchen aspirin EC 81 MG EC tablet Take 1 tablet (81 mg total) by mouth daily.  . Insulin Glargine (LANTUS) 100 UNIT/ML Solostar Pen Inject 25 Units into the skin daily.     Allergies:   Patient has no known allergies.   Social History   Socioeconomic History  . Marital status: Married    Spouse name: Not on file  . Number of children: Not on file  . Years of education: Not on file  . Highest education level: Not on file  Occupational History  . Not on file  Tobacco Use  . Smoking status: Never Smoker  . Smokeless tobacco: Never Used  Substance and Sexual Activity  . Alcohol use: Never  . Drug use: Not on file  . Sexual activity: Not on file  Other Topics Concern  . Not on file  Social History Narrative  . Not on file   Social Determinants of Health   Financial Resource Strain:   . Difficulty of Paying Living Expenses: Not on file  Food Insecurity:   . Worried About Charity fundraiser in the Last Year: Not on file  . Ran Out of Food in the Last Year: Not on file  Transportation Needs:   . Lack of Transportation (Medical): Not on file  . Lack of Transportation (Non-Medical): Not on file  Physical Activity:   . Days of Exercise per Week: Not on file  . Minutes of Exercise per Session: Not on file  Stress:    . Feeling of Stress : Not on file  Social Connections:   . Frequency of Communication with Friends and Family: Not on file  . Frequency of Social Gatherings with Friends and Family: Not on file  . Attends Religious Services: Not on file  . Active Member of Clubs or Organizations: Not on file  . Attends Archivist Meetings: Not on file  . Marital Status: Not on file     Family History: The patient's family history includes Congestive Heart Failure in his father; Other in his mother; Prostate cancer in his brother and father; Valvular heart disease in his sister. ROS:   Please see the history of present illness.    All other systems reviewed and are negative.  EKGs/Labs/Other Studies Reviewed:    The following studies were reviewed today:  EKG:  EKG ordered today and personally reviewed.  The ekg  ordered today demonstrates sinus rhythm normal EKG  Recent Labs:  0 05/24/2019 HDL cholesterol 50 last LDL 02/15/2059 total cholesterol 05/24/1999 2176 A1c 1116 2026.2% 12/25/2018: ALT 37; BUN 20; Creatinine, Ser 0.78; Hemoglobin 14.7; Platelets 214; Potassium 3.9; Sodium 138  Recent Lipid Panel    Component Value Date/Time   CHOL 227 (H) 12/17/2018 0212   TRIG 81 12/17/2018 0212   HDL 45 12/17/2018 0212   CHOLHDL 5.0 12/17/2018 0212   VLDL 16 12/17/2018 0212   LDLCALC 166 (H) 12/17/2018 0212    Physical Exam:    VS:  BP 120/68   Pulse 60   Ht 5' 11.5" (1.816 m)   Wt 216 lb 9.6 oz (98.2 kg)   SpO2 97%   BMI 29.79 kg/m     Wt Readings from Last 3 Encounters:  07/24/19 216 lb 9.6 oz (98.2 kg)  06/19/19 208 lb 9.6 oz (94.6 kg)  06/10/19 203 lb (92.1 kg)     GEN:  Well nourished, well developed in no acute distress HEENT: Normal NECK: No JVD; No carotid bruits LYMPHATICS: No lymphadenopathy CARDIAC: RRR, no murmurs, rubs, gallops RESPIRATORY:  Clear to auscultation without rales, wheezing or rhonchi  ABDOMEN: Soft, non-tender, non-distended MUSCULOSKELETAL:  No  edema; No deformity  SKIN: Warm and dry NEUROLOGIC:  Alert and oriented x 3 PSYCHIATRIC:  Normal affect    Signed, Robert More, MD  07/24/2019 3:09 PM    [facility]

## 2019-07-24 ENCOUNTER — Ambulatory Visit (INDEPENDENT_AMBULATORY_CARE_PROVIDER_SITE_OTHER): Payer: Medicare HMO | Admitting: Cardiology

## 2019-07-24 ENCOUNTER — Other Ambulatory Visit: Payer: Self-pay

## 2019-07-24 ENCOUNTER — Encounter: Payer: Self-pay | Admitting: Cardiology

## 2019-07-24 VITALS — BP 120/68 | HR 60 | Ht 71.5 in | Wt 216.6 lb

## 2019-07-24 DIAGNOSIS — I639 Cerebral infarction, unspecified: Secondary | ICD-10-CM

## 2019-07-24 DIAGNOSIS — Z794 Long term (current) use of insulin: Secondary | ICD-10-CM

## 2019-07-24 DIAGNOSIS — E1149 Type 2 diabetes mellitus with other diabetic neurological complication: Secondary | ICD-10-CM

## 2019-07-24 DIAGNOSIS — E785 Hyperlipidemia, unspecified: Secondary | ICD-10-CM

## 2019-07-24 DIAGNOSIS — R030 Elevated blood-pressure reading, without diagnosis of hypertension: Secondary | ICD-10-CM

## 2019-07-24 NOTE — Patient Instructions (Signed)
Medication Instructions:  Your physician recommends that you continue on your current medications as directed. Please refer to the Current Medication list given to you today.  *If you need a refill on your cardiac medications before your next appointment, please call your pharmacy*  Lab Work: NONE If you have labs (blood work) drawn today and your tests are completely normal, you will receive your results only by: . MyChart Message (if you have MyChart) OR . A paper copy in the mail If you have any lab test that is abnormal or we need to change your treatment, we will call you to review the results.  Testing/Procedures: You had an EKG performed today  Follow-Up: At [facility], you and your health needs are our priority.  As part of our continuing mission to provide you with exceptional heart care, we have created designated Provider Care Teams.  These Care Teams include your primary Cardiologist (physician) and Advanced Practice Providers (APPs -  Physician Assistants and Nurse Practitioners) who all work together to provide you with the care you need, when you need it.  Your next appointment:   6 month(s)  The format for your next appointment:   In Person  Provider:   Brian Munley, MD    

## 2019-07-25 DIAGNOSIS — Z794 Long term (current) use of insulin: Secondary | ICD-10-CM | POA: Diagnosis not present

## 2019-07-25 DIAGNOSIS — Z8673 Personal history of transient ischemic attack (TIA), and cerebral infarction without residual deficits: Secondary | ICD-10-CM | POA: Diagnosis not present

## 2019-07-25 DIAGNOSIS — I6930 Unspecified sequelae of cerebral infarction: Secondary | ICD-10-CM | POA: Diagnosis not present

## 2019-07-25 DIAGNOSIS — I10 Essential (primary) hypertension: Secondary | ICD-10-CM | POA: Diagnosis not present

## 2019-07-25 DIAGNOSIS — E663 Overweight: Secondary | ICD-10-CM | POA: Diagnosis not present

## 2019-07-25 DIAGNOSIS — E7800 Pure hypercholesterolemia, unspecified: Secondary | ICD-10-CM | POA: Diagnosis not present

## 2019-07-25 DIAGNOSIS — E118 Type 2 diabetes mellitus with unspecified complications: Secondary | ICD-10-CM | POA: Diagnosis not present

## 2019-07-25 DIAGNOSIS — K5909 Other constipation: Secondary | ICD-10-CM | POA: Diagnosis not present

## 2019-07-25 DIAGNOSIS — Z6829 Body mass index (BMI) 29.0-29.9, adult: Secondary | ICD-10-CM | POA: Diagnosis not present

## 2019-07-25 DIAGNOSIS — Z2821 Immunization not carried out because of patient refusal: Secondary | ICD-10-CM | POA: Diagnosis not present

## 2019-07-30 DIAGNOSIS — R2681 Unsteadiness on feet: Secondary | ICD-10-CM | POA: Diagnosis not present

## 2019-07-30 DIAGNOSIS — I69322 Dysarthria following cerebral infarction: Secondary | ICD-10-CM | POA: Diagnosis not present

## 2019-07-30 DIAGNOSIS — I69351 Hemiplegia and hemiparesis following cerebral infarction affecting right dominant side: Secondary | ICD-10-CM | POA: Diagnosis not present

## 2019-07-30 DIAGNOSIS — M6281 Muscle weakness (generalized): Secondary | ICD-10-CM | POA: Diagnosis not present

## 2019-07-30 DIAGNOSIS — M25511 Pain in right shoulder: Secondary | ICD-10-CM | POA: Diagnosis not present

## 2019-07-30 DIAGNOSIS — M25311 Other instability, right shoulder: Secondary | ICD-10-CM | POA: Diagnosis not present

## 2019-07-30 DIAGNOSIS — R278 Other lack of coordination: Secondary | ICD-10-CM | POA: Diagnosis not present

## 2019-07-30 DIAGNOSIS — I69392 Facial weakness following cerebral infarction: Secondary | ICD-10-CM | POA: Diagnosis not present

## 2019-08-08 DIAGNOSIS — I69351 Hemiplegia and hemiparesis following cerebral infarction affecting right dominant side: Secondary | ICD-10-CM | POA: Diagnosis not present

## 2019-08-08 DIAGNOSIS — M25311 Other instability, right shoulder: Secondary | ICD-10-CM | POA: Diagnosis not present

## 2019-08-08 DIAGNOSIS — R2681 Unsteadiness on feet: Secondary | ICD-10-CM | POA: Diagnosis not present

## 2019-08-08 DIAGNOSIS — I69392 Facial weakness following cerebral infarction: Secondary | ICD-10-CM | POA: Diagnosis not present

## 2019-08-08 DIAGNOSIS — I69322 Dysarthria following cerebral infarction: Secondary | ICD-10-CM | POA: Diagnosis not present

## 2019-08-08 DIAGNOSIS — M25511 Pain in right shoulder: Secondary | ICD-10-CM | POA: Diagnosis not present

## 2019-08-08 DIAGNOSIS — R278 Other lack of coordination: Secondary | ICD-10-CM | POA: Diagnosis not present

## 2019-08-08 DIAGNOSIS — M6281 Muscle weakness (generalized): Secondary | ICD-10-CM | POA: Diagnosis not present

## 2019-08-13 DIAGNOSIS — I69351 Hemiplegia and hemiparesis following cerebral infarction affecting right dominant side: Secondary | ICD-10-CM | POA: Diagnosis not present

## 2019-08-13 DIAGNOSIS — I69322 Dysarthria following cerebral infarction: Secondary | ICD-10-CM | POA: Diagnosis not present

## 2019-08-13 DIAGNOSIS — I69392 Facial weakness following cerebral infarction: Secondary | ICD-10-CM | POA: Diagnosis not present

## 2019-08-13 DIAGNOSIS — R2681 Unsteadiness on feet: Secondary | ICD-10-CM | POA: Diagnosis not present

## 2019-08-13 DIAGNOSIS — M25311 Other instability, right shoulder: Secondary | ICD-10-CM | POA: Diagnosis not present

## 2019-08-13 DIAGNOSIS — R278 Other lack of coordination: Secondary | ICD-10-CM | POA: Diagnosis not present

## 2019-08-13 DIAGNOSIS — M25511 Pain in right shoulder: Secondary | ICD-10-CM | POA: Diagnosis not present

## 2019-08-13 DIAGNOSIS — M6281 Muscle weakness (generalized): Secondary | ICD-10-CM | POA: Diagnosis not present

## 2019-08-14 DIAGNOSIS — E119 Type 2 diabetes mellitus without complications: Secondary | ICD-10-CM | POA: Diagnosis not present

## 2019-08-14 LAB — HM DIABETES EYE EXAM

## 2019-08-15 DIAGNOSIS — M6281 Muscle weakness (generalized): Secondary | ICD-10-CM | POA: Diagnosis not present

## 2019-08-15 DIAGNOSIS — R2681 Unsteadiness on feet: Secondary | ICD-10-CM | POA: Diagnosis not present

## 2019-08-15 DIAGNOSIS — I69351 Hemiplegia and hemiparesis following cerebral infarction affecting right dominant side: Secondary | ICD-10-CM | POA: Diagnosis not present

## 2019-08-15 DIAGNOSIS — I69322 Dysarthria following cerebral infarction: Secondary | ICD-10-CM | POA: Diagnosis not present

## 2019-08-15 DIAGNOSIS — M25511 Pain in right shoulder: Secondary | ICD-10-CM | POA: Diagnosis not present

## 2019-08-15 DIAGNOSIS — R278 Other lack of coordination: Secondary | ICD-10-CM | POA: Diagnosis not present

## 2019-08-15 DIAGNOSIS — M25311 Other instability, right shoulder: Secondary | ICD-10-CM | POA: Diagnosis not present

## 2019-08-15 DIAGNOSIS — I69392 Facial weakness following cerebral infarction: Secondary | ICD-10-CM | POA: Diagnosis not present

## 2019-08-20 DIAGNOSIS — M25511 Pain in right shoulder: Secondary | ICD-10-CM | POA: Diagnosis not present

## 2019-08-20 DIAGNOSIS — M25311 Other instability, right shoulder: Secondary | ICD-10-CM | POA: Diagnosis not present

## 2019-08-20 DIAGNOSIS — M6281 Muscle weakness (generalized): Secondary | ICD-10-CM | POA: Diagnosis not present

## 2019-08-20 DIAGNOSIS — R278 Other lack of coordination: Secondary | ICD-10-CM | POA: Diagnosis not present

## 2019-08-20 DIAGNOSIS — I69322 Dysarthria following cerebral infarction: Secondary | ICD-10-CM | POA: Diagnosis not present

## 2019-08-20 DIAGNOSIS — I69392 Facial weakness following cerebral infarction: Secondary | ICD-10-CM | POA: Diagnosis not present

## 2019-08-20 DIAGNOSIS — I69351 Hemiplegia and hemiparesis following cerebral infarction affecting right dominant side: Secondary | ICD-10-CM | POA: Diagnosis not present

## 2019-08-20 DIAGNOSIS — R2681 Unsteadiness on feet: Secondary | ICD-10-CM | POA: Diagnosis not present

## 2019-08-22 DIAGNOSIS — I69392 Facial weakness following cerebral infarction: Secondary | ICD-10-CM | POA: Diagnosis not present

## 2019-08-22 DIAGNOSIS — R278 Other lack of coordination: Secondary | ICD-10-CM | POA: Diagnosis not present

## 2019-08-22 DIAGNOSIS — M6281 Muscle weakness (generalized): Secondary | ICD-10-CM | POA: Diagnosis not present

## 2019-08-22 DIAGNOSIS — I69322 Dysarthria following cerebral infarction: Secondary | ICD-10-CM | POA: Diagnosis not present

## 2019-08-22 DIAGNOSIS — R2681 Unsteadiness on feet: Secondary | ICD-10-CM | POA: Diagnosis not present

## 2019-08-22 DIAGNOSIS — M25511 Pain in right shoulder: Secondary | ICD-10-CM | POA: Diagnosis not present

## 2019-08-22 DIAGNOSIS — I69351 Hemiplegia and hemiparesis following cerebral infarction affecting right dominant side: Secondary | ICD-10-CM | POA: Diagnosis not present

## 2019-08-22 DIAGNOSIS — M25311 Other instability, right shoulder: Secondary | ICD-10-CM | POA: Diagnosis not present

## 2019-08-23 ENCOUNTER — Encounter: Payer: Medicare HMO | Admitting: Physical Medicine & Rehabilitation

## 2019-08-27 DIAGNOSIS — I69392 Facial weakness following cerebral infarction: Secondary | ICD-10-CM | POA: Diagnosis not present

## 2019-08-27 DIAGNOSIS — R278 Other lack of coordination: Secondary | ICD-10-CM | POA: Diagnosis not present

## 2019-08-27 DIAGNOSIS — M6281 Muscle weakness (generalized): Secondary | ICD-10-CM | POA: Diagnosis not present

## 2019-08-27 DIAGNOSIS — R2681 Unsteadiness on feet: Secondary | ICD-10-CM | POA: Diagnosis not present

## 2019-08-27 DIAGNOSIS — I69351 Hemiplegia and hemiparesis following cerebral infarction affecting right dominant side: Secondary | ICD-10-CM | POA: Diagnosis not present

## 2019-08-27 DIAGNOSIS — M25511 Pain in right shoulder: Secondary | ICD-10-CM | POA: Diagnosis not present

## 2019-08-27 DIAGNOSIS — I69322 Dysarthria following cerebral infarction: Secondary | ICD-10-CM | POA: Diagnosis not present

## 2019-08-27 DIAGNOSIS — M25311 Other instability, right shoulder: Secondary | ICD-10-CM | POA: Diagnosis not present

## 2019-08-29 DIAGNOSIS — I69322 Dysarthria following cerebral infarction: Secondary | ICD-10-CM | POA: Diagnosis not present

## 2019-08-29 DIAGNOSIS — R278 Other lack of coordination: Secondary | ICD-10-CM | POA: Diagnosis not present

## 2019-08-29 DIAGNOSIS — I69392 Facial weakness following cerebral infarction: Secondary | ICD-10-CM | POA: Diagnosis not present

## 2019-08-29 DIAGNOSIS — M6281 Muscle weakness (generalized): Secondary | ICD-10-CM | POA: Diagnosis not present

## 2019-08-29 DIAGNOSIS — M25311 Other instability, right shoulder: Secondary | ICD-10-CM | POA: Diagnosis not present

## 2019-08-29 DIAGNOSIS — R2681 Unsteadiness on feet: Secondary | ICD-10-CM | POA: Diagnosis not present

## 2019-08-29 DIAGNOSIS — I69351 Hemiplegia and hemiparesis following cerebral infarction affecting right dominant side: Secondary | ICD-10-CM | POA: Diagnosis not present

## 2019-08-29 DIAGNOSIS — M25511 Pain in right shoulder: Secondary | ICD-10-CM | POA: Diagnosis not present

## 2019-09-03 DIAGNOSIS — I69322 Dysarthria following cerebral infarction: Secondary | ICD-10-CM | POA: Diagnosis not present

## 2019-09-03 DIAGNOSIS — I69351 Hemiplegia and hemiparesis following cerebral infarction affecting right dominant side: Secondary | ICD-10-CM | POA: Diagnosis not present

## 2019-09-03 DIAGNOSIS — M25311 Other instability, right shoulder: Secondary | ICD-10-CM | POA: Diagnosis not present

## 2019-09-03 DIAGNOSIS — I69392 Facial weakness following cerebral infarction: Secondary | ICD-10-CM | POA: Diagnosis not present

## 2019-09-03 DIAGNOSIS — R2681 Unsteadiness on feet: Secondary | ICD-10-CM | POA: Diagnosis not present

## 2019-09-03 DIAGNOSIS — M6281 Muscle weakness (generalized): Secondary | ICD-10-CM | POA: Diagnosis not present

## 2019-09-03 DIAGNOSIS — M25511 Pain in right shoulder: Secondary | ICD-10-CM | POA: Diagnosis not present

## 2019-09-03 DIAGNOSIS — R278 Other lack of coordination: Secondary | ICD-10-CM | POA: Diagnosis not present

## 2019-09-05 DIAGNOSIS — I69351 Hemiplegia and hemiparesis following cerebral infarction affecting right dominant side: Secondary | ICD-10-CM | POA: Diagnosis not present

## 2019-09-05 DIAGNOSIS — I69322 Dysarthria following cerebral infarction: Secondary | ICD-10-CM | POA: Diagnosis not present

## 2019-09-05 DIAGNOSIS — R278 Other lack of coordination: Secondary | ICD-10-CM | POA: Diagnosis not present

## 2019-09-05 DIAGNOSIS — M25511 Pain in right shoulder: Secondary | ICD-10-CM | POA: Diagnosis not present

## 2019-09-05 DIAGNOSIS — M6281 Muscle weakness (generalized): Secondary | ICD-10-CM | POA: Diagnosis not present

## 2019-09-05 DIAGNOSIS — I69392 Facial weakness following cerebral infarction: Secondary | ICD-10-CM | POA: Diagnosis not present

## 2019-09-05 DIAGNOSIS — M25311 Other instability, right shoulder: Secondary | ICD-10-CM | POA: Diagnosis not present

## 2019-09-05 DIAGNOSIS — R2681 Unsteadiness on feet: Secondary | ICD-10-CM | POA: Diagnosis not present

## 2019-09-09 ENCOUNTER — Encounter: Payer: Self-pay | Admitting: Internal Medicine

## 2019-09-09 ENCOUNTER — Ambulatory Visit (INDEPENDENT_AMBULATORY_CARE_PROVIDER_SITE_OTHER): Payer: Medicare HMO | Admitting: Internal Medicine

## 2019-09-09 ENCOUNTER — Other Ambulatory Visit: Payer: Self-pay

## 2019-09-09 VITALS — BP 130/80 | HR 54 | Ht 71.5 in | Wt 217.0 lb

## 2019-09-09 DIAGNOSIS — E1159 Type 2 diabetes mellitus with other circulatory complications: Secondary | ICD-10-CM

## 2019-09-09 DIAGNOSIS — E663 Overweight: Secondary | ICD-10-CM | POA: Diagnosis not present

## 2019-09-09 DIAGNOSIS — E1165 Type 2 diabetes mellitus with hyperglycemia: Secondary | ICD-10-CM

## 2019-09-09 DIAGNOSIS — E785 Hyperlipidemia, unspecified: Secondary | ICD-10-CM

## 2019-09-09 LAB — POCT GLYCOSYLATED HEMOGLOBIN (HGB A1C): Hemoglobin A1C: 6.8 % — AB (ref 4.0–5.6)

## 2019-09-09 NOTE — Progress Notes (Signed)
Patient ID: Robert Brewer, male   DOB: Dec 20, 1945, 74 y.o.   MRN: [id number]   This visit occurred during the SARS-CoV-2 public health emergency.  Safety protocols were in place, including screening questions prior to the visit, additional usage of staff PPE, and extensive cleaning of exam room while observing appropriate contact time as indicated for disinfecting solutions.   HPI: Robert Brewer is a 74 y.o.-year-old male, initially referred by Cathlyn Parsons, PA-C, returning for follow-up DM2, dx in 2009, insulin-dependent since 12/2018, uncontrolled, with complications (stroke [id number]).  His wife, who is also my patient, Robert Brewer, accompanies the patient today and offers more information about his diet, medication, and blood sugars. PCP: Cher Nakai, MD, [place]  He has a history of uncontrolled diabetes, for which he refused medicines until he had a stroke 11/2018 >> after this, he was started on insulin.  Reviewed his HbA1c levels: Lab Results  Component Value Date   HGBA1C 6.2 (A) 06/10/2019   HGBA1C 6.5 (A) 03/19/2019   HGBA1C 13.3 (H) 12/17/2018   Pt is on a regimen of: - Lantus 25 units at bedtime-started 12/2018 He was on JanuMet in the past, but not in last 3 years.  Pt checks his sugars once a day once a day: - am: 90, 104-125, 144 >> 74-142, 154 >>  100-139, 142 - 2h after b'fast: n/c >> 133 >> n/c - before lunch: n/c >> 100-115 >> 96-124, 145 - 2h after lunch: n/c - before dinner: n/c >> 97-114, 181 - 2h after dinner: n/c >> 163 >> n/c  - bedtime: n/c >> 132, 149 >> n/c >> 253 - nighttime: n/c Lowest sugar was 66 in the hospital >> 74 >> 87; he has hypoglycemia awareness in the 70s.  Highest sugar was 144 >> 269 (forgot insulin) >> 253 x1 (forgot Lantus).  Glucometer: ReliOn  Pt's meals are: - Breakfast: whole wheat cereal with milk, sometimes egg + toast + bacon; oatmeal or grits - Lunch: none - Dinner: steak + potatoes + veggies; pizza; salad + mayo -  Snacks: PB sandwich + milk He was drinking a lot of regular sodas and milk before the stroke, also eating a lot of icecream.  She stopped these after the stroke.  He continues physical therapy.  No CKD, last BUN/creatinine:  07/25/2019: 16/0.71, ACR 3 Lab Results  Component Value Date   BUN 20 12/25/2018   BUN 19 12/17/2018   CREATININE 0.78 12/25/2018   CREATININE 0.79 12/17/2018  Not on ACE inhibitor/ARB.  + HL; last set of lipids: 05/24/2019: 176/56/50/? 02/15/2019: LDL 60 Lab Results  Component Value Date   CHOL 227 (H) 12/17/2018   HDL 45 12/17/2018   LDLCALC 166 (H) 12/17/2018   TRIG 81 12/17/2018   CHOLHDL 5.0 12/17/2018  She was started on a statin after his stroke but he apparently stopped after LDL cholesterol decreased to 60.  At last visit, I did advise him to restart his.  He had another lipid panel by PCP apparently, after which LDL returned at 111 but I do not have the records.  - last eye exam was in 07/2019: No DR  - no numbness and tingling in his feet.  Pt has FH of DM in brother.  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   Latest TSH was normal on 01/24/2019: 0.577  ROS: Constitutional: no weight gain/no weight loss, no fatigue, no  subjective hyperthermia, no subjective hypothermia Eyes: no blurry vision, no xerophthalmia ENT: no sore throat, no nodules palpated in neck, no dysphagia, no odynophagia, no hoarseness Cardiovascular: no CP/no SOB/no palpitations/no leg swelling Respiratory: no cough/no SOB/no wheezing Gastrointestinal: no N/no V/no D/no C/no acid reflux Musculoskeletal: no muscle aches/no joint aches Skin: no rashes, no hair loss Neurological: no tremors/no numbness/no tingling/no dizziness  I reviewed pt's medications, allergies, PMH, social hx, family hx, and changes were documented in the history of present illness. Otherwise,  unchanged from my initial visit note.  Past Medical History:  Diagnosis Date  . Stroke (cerebrum) ([place])      No past surgical history on file.   Social History   Socioeconomic History  . Marital status: Married    Spouse name: Not on file  . Number of children: 0  . Years of education: Not on file  . Highest education level: Not on file  Occupational History  .  Retired  Scientific laboratory technician  . Financial resource strain: Not on file  . Food insecurity    Worry: Not on file    Inability: Not on file  . Transportation needs    Medical: Not on file    Non-medical: Not on file  Tobacco Use  . Smoking status: Never Smoker  . Smokeless tobacco: Never Used  Substance and Sexual Activity  . Alcohol use:  Beer    Frequency:  Rarely  . Drug use: No   Current Outpatient Medications on File Prior to Visit  Medication Sig Dispense Refill  . acetaminophen (TYLENOL) 500 MG tablet Take 1,000 mg by mouth daily.    Marland Kitchen aspirin EC 81 MG EC tablet Take 1 tablet (81 mg total) by mouth daily.    . Insulin Glargine (LANTUS) 100 UNIT/ML Solostar Pen Inject 25 Units into the skin daily. 15 mL 11   No current facility-administered medications on file prior to visit.   No Known Allergies Family History  Problem Relation Age of Onset  . Other Mother        healthy, lived to her 58's  . Prostate cancer Father        lived to his 85's  . Congestive Heart Failure Father   . Valvular heart disease Sister   . Prostate cancer Brother   + See HPI  PE: BP 130/80   Pulse (!) 54   Ht 5' 11.5" (1.816 m)   Wt 217 lb (98.4 kg)   SpO2 97%   BMI 29.84 kg/m  Wt Readings from Last 3 Encounters:  09/09/19 217 lb (98.4 kg)  07/24/19 216 lb 9.6 oz (98.2 kg)  06/19/19 208 lb 9.6 oz (94.6 kg)   Constitutional: overweight, in NAD, walks with a cane Eyes: PERRLA, EOMI, no exophthalmos ENT: moist mucous membranes, no thyromegaly, no cervical lymphadenopathy Cardiovascular: RRR, No MRG, + swelling in the  right leg and arm Respiratory: CTA B Gastrointestinal: abdomen soft, NT, ND, BS+ Musculoskeletal: no deformities, decreased strength on the right side of his body Skin: moist, warm, no rashes Neurological: no tremor with outstretched hands, DTR normal in all 4  ASSESSMENT: 1. 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 HL  3.  Overweight  PLAN:  1. Patient with longstanding, type 2 diabetes, with a history of stroke in 11/2018, after which she continues to have paresis on the right side of his body.  The stroke was probably due to both  uncontrolled diabetes and also uncontrolled blood sugar and cholesterol levels.  He finally accepted treatment for his diabetes abscesses.  When I first saw him he was already started on Lantus and we discussed about starting GLP-1 receptor agonist or SGLT2 inhibitor but we did not start these yet as he is very minimalistic and the regimen that he would like to follow.  He also had a very poor diet before the stroke and he eliminated sweet drinks, ice cream, and other sweets afterwards.  He refused a referral to nutrition.  At last visit, I advised him to check sugars later in the day, also, as he was only checking in the morning.  However, at that time his HbA1c was better, at 6.2%. -At this visit, sugars are slightly higher than before but still at or close to goal with few exceptions.  Sugars were higher around the time of the holidays and he even had a blood sugar higher than 200s when he skipped insulin.  However, when he takes the insulin consistently, he does have good control of his diabetes.  At this visit we discussed about possibly adding a GLP-1 receptor agonist or an SGLT2 inhibitor to help with his cardiovascular risk but he is not interested in an escalation of his regimen and wants to continue only with Lantus. - I suggested to:  Patient  Instructions  Please continue: - Lantus 25 units at bedtime.  Please return in 3 months with your sugar log.   - we checked his HbA1c: 6.8% (higher) - advised to check sugars at different times of the day - 1x a day, rotating check times - advised for yearly eye exams >> he is UTD - return to clinic in 3-4 months  2. HL -Reviewed latest lipid panel from 11/2018: LDL very high compared to his target (ideally <70): Lab Results  Component Value Date   CHOL 227 (H) 12/17/2018   HDL 45 12/17/2018   LDLCALC 166 (H) 12/17/2018   TRIG 81 12/17/2018   CHOLHDL 5.0 12/17/2018  -At last visit: I explained then the many benefits of a statin beyond lowering cholesterol levels and strongly advised him to restart it.  He apparently had another lipid panel before our last visit >> LDL 111 (I do not have these records) -he refused to start a statin...  3.  Overweight -He gained weight since last visit: 9 pounds, most likely due to the holidays and also due to insulin -Unfortunately, refuses a GLP-1 receptor agonist SGLT2 inhibitor, which would have helped with weight loss, also  Philemon Kingdom, MD PhD [facility] Endocrinology

## 2019-09-09 NOTE — Patient Instructions (Signed)
Please continue: - Lantus 25 units at bedtime.  Please return in 4 months with your sugar log.

## 2019-09-10 DIAGNOSIS — M25311 Other instability, right shoulder: Secondary | ICD-10-CM | POA: Diagnosis not present

## 2019-09-10 DIAGNOSIS — I69322 Dysarthria following cerebral infarction: Secondary | ICD-10-CM | POA: Diagnosis not present

## 2019-09-10 DIAGNOSIS — M25511 Pain in right shoulder: Secondary | ICD-10-CM | POA: Diagnosis not present

## 2019-09-10 DIAGNOSIS — R2681 Unsteadiness on feet: Secondary | ICD-10-CM | POA: Diagnosis not present

## 2019-09-10 DIAGNOSIS — I69351 Hemiplegia and hemiparesis following cerebral infarction affecting right dominant side: Secondary | ICD-10-CM | POA: Diagnosis not present

## 2019-09-10 DIAGNOSIS — M6281 Muscle weakness (generalized): Secondary | ICD-10-CM | POA: Diagnosis not present

## 2019-09-10 DIAGNOSIS — I69392 Facial weakness following cerebral infarction: Secondary | ICD-10-CM | POA: Diagnosis not present

## 2019-09-10 DIAGNOSIS — R278 Other lack of coordination: Secondary | ICD-10-CM | POA: Diagnosis not present

## 2019-09-17 DIAGNOSIS — M25311 Other instability, right shoulder: Secondary | ICD-10-CM | POA: Diagnosis not present

## 2019-09-17 DIAGNOSIS — I69322 Dysarthria following cerebral infarction: Secondary | ICD-10-CM | POA: Diagnosis not present

## 2019-09-17 DIAGNOSIS — I69392 Facial weakness following cerebral infarction: Secondary | ICD-10-CM | POA: Diagnosis not present

## 2019-09-17 DIAGNOSIS — I69351 Hemiplegia and hemiparesis following cerebral infarction affecting right dominant side: Secondary | ICD-10-CM | POA: Diagnosis not present

## 2019-09-17 DIAGNOSIS — M25511 Pain in right shoulder: Secondary | ICD-10-CM | POA: Diagnosis not present

## 2019-09-17 DIAGNOSIS — R2681 Unsteadiness on feet: Secondary | ICD-10-CM | POA: Diagnosis not present

## 2019-09-17 DIAGNOSIS — R278 Other lack of coordination: Secondary | ICD-10-CM | POA: Diagnosis not present

## 2019-09-17 DIAGNOSIS — M6281 Muscle weakness (generalized): Secondary | ICD-10-CM | POA: Diagnosis not present

## 2019-09-18 DIAGNOSIS — E7800 Pure hypercholesterolemia, unspecified: Secondary | ICD-10-CM | POA: Diagnosis not present

## 2019-09-18 DIAGNOSIS — I10 Essential (primary) hypertension: Secondary | ICD-10-CM | POA: Diagnosis not present

## 2019-09-18 DIAGNOSIS — I6930 Unspecified sequelae of cerebral infarction: Secondary | ICD-10-CM | POA: Diagnosis not present

## 2019-09-18 DIAGNOSIS — Z2821 Immunization not carried out because of patient refusal: Secondary | ICD-10-CM | POA: Diagnosis not present

## 2019-09-18 DIAGNOSIS — Z8673 Personal history of transient ischemic attack (TIA), and cerebral infarction without residual deficits: Secondary | ICD-10-CM | POA: Diagnosis not present

## 2019-09-18 DIAGNOSIS — Z794 Long term (current) use of insulin: Secondary | ICD-10-CM | POA: Diagnosis not present

## 2019-09-18 DIAGNOSIS — E663 Overweight: Secondary | ICD-10-CM | POA: Diagnosis not present

## 2019-09-18 DIAGNOSIS — K5909 Other constipation: Secondary | ICD-10-CM | POA: Diagnosis not present

## 2019-09-18 DIAGNOSIS — Z6830 Body mass index (BMI) 30.0-30.9, adult: Secondary | ICD-10-CM | POA: Diagnosis not present

## 2019-09-18 DIAGNOSIS — E118 Type 2 diabetes mellitus with unspecified complications: Secondary | ICD-10-CM | POA: Diagnosis not present

## 2019-09-19 DIAGNOSIS — I69322 Dysarthria following cerebral infarction: Secondary | ICD-10-CM | POA: Diagnosis not present

## 2019-09-19 DIAGNOSIS — M25311 Other instability, right shoulder: Secondary | ICD-10-CM | POA: Diagnosis not present

## 2019-09-19 DIAGNOSIS — M6281 Muscle weakness (generalized): Secondary | ICD-10-CM | POA: Diagnosis not present

## 2019-09-19 DIAGNOSIS — I69351 Hemiplegia and hemiparesis following cerebral infarction affecting right dominant side: Secondary | ICD-10-CM | POA: Diagnosis not present

## 2019-09-19 DIAGNOSIS — I69392 Facial weakness following cerebral infarction: Secondary | ICD-10-CM | POA: Diagnosis not present

## 2019-09-19 DIAGNOSIS — R278 Other lack of coordination: Secondary | ICD-10-CM | POA: Diagnosis not present

## 2019-09-19 DIAGNOSIS — R2681 Unsteadiness on feet: Secondary | ICD-10-CM | POA: Diagnosis not present

## 2019-09-19 DIAGNOSIS — M25511 Pain in right shoulder: Secondary | ICD-10-CM | POA: Diagnosis not present

## 2019-09-23 DIAGNOSIS — I69322 Dysarthria following cerebral infarction: Secondary | ICD-10-CM | POA: Diagnosis not present

## 2019-09-23 DIAGNOSIS — I69392 Facial weakness following cerebral infarction: Secondary | ICD-10-CM | POA: Diagnosis not present

## 2019-09-23 DIAGNOSIS — R2681 Unsteadiness on feet: Secondary | ICD-10-CM | POA: Diagnosis not present

## 2019-09-23 DIAGNOSIS — R2689 Other abnormalities of gait and mobility: Secondary | ICD-10-CM | POA: Diagnosis not present

## 2019-09-23 DIAGNOSIS — I69351 Hemiplegia and hemiparesis following cerebral infarction affecting right dominant side: Secondary | ICD-10-CM | POA: Diagnosis not present

## 2019-09-23 DIAGNOSIS — M6281 Muscle weakness (generalized): Secondary | ICD-10-CM | POA: Diagnosis not present

## 2019-09-25 DIAGNOSIS — I69322 Dysarthria following cerebral infarction: Secondary | ICD-10-CM | POA: Diagnosis not present

## 2019-09-25 DIAGNOSIS — R2681 Unsteadiness on feet: Secondary | ICD-10-CM | POA: Diagnosis not present

## 2019-09-25 DIAGNOSIS — I69351 Hemiplegia and hemiparesis following cerebral infarction affecting right dominant side: Secondary | ICD-10-CM | POA: Diagnosis not present

## 2019-09-25 DIAGNOSIS — M6281 Muscle weakness (generalized): Secondary | ICD-10-CM | POA: Diagnosis not present

## 2019-09-25 DIAGNOSIS — R2689 Other abnormalities of gait and mobility: Secondary | ICD-10-CM | POA: Diagnosis not present

## 2019-09-25 DIAGNOSIS — I69392 Facial weakness following cerebral infarction: Secondary | ICD-10-CM | POA: Diagnosis not present

## 2019-10-04 ENCOUNTER — Encounter: Payer: Medicare HMO | Attending: Physical Medicine & Rehabilitation | Admitting: Physical Medicine & Rehabilitation

## 2019-10-04 ENCOUNTER — Encounter: Payer: Self-pay | Admitting: Physical Medicine & Rehabilitation

## 2019-10-04 ENCOUNTER — Other Ambulatory Visit: Payer: Self-pay

## 2019-10-04 VITALS — BP 168/76 | HR 51 | Temp 98.7°F | Ht 71.5 in | Wt 220.4 lb

## 2019-10-04 DIAGNOSIS — I6350 Cerebral infarction due to unspecified occlusion or stenosis of unspecified cerebral artery: Secondary | ICD-10-CM

## 2019-10-04 DIAGNOSIS — R269 Unspecified abnormalities of gait and mobility: Secondary | ICD-10-CM | POA: Diagnosis not present

## 2019-10-04 DIAGNOSIS — I69398 Other sequelae of cerebral infarction: Secondary | ICD-10-CM

## 2019-10-04 DIAGNOSIS — I639 Cerebral infarction, unspecified: Secondary | ICD-10-CM

## 2019-10-04 NOTE — Progress Notes (Signed)
Subjective:    Patient ID: Robert Brewer, male    DOB: 04/07/1946, 74 y.o.   MRN: [id number] 74 year old right-handed male with history of diabetes mellitus, hypertension and medical noncompliance. On no prescription medications. Lives with spouse. Independent prior to admission. Presented to [facility] 12/17/2018 with acute onset of right side weakness slurred speech and facial droop while mowing the grass. CT of the head at outside hospital showed no hemorrhage. Patient did receive TPA was transferred to [facility] [place]. CT angiogram of head and neck showed 1 cm hypodensity involving the left paramedian pons and suspicious for evolving acute ischemic infarction. CTA head and neck with no large vessel occlusion no significant intraluminal thrombus or dissection identified. MRI confirms acute infarct left paramedian pons small acute cortical infarct left occipital lobe. Negative for hemorrhage. Maintained on aspirin for CVA prophylaxis. Echocardiogram with ejection fraction of [id number] normal systolic function negative bubble study. Recommendations were made for loop recorder but patient refused. Hemoglobin A1c 13.3 insulin therapy as directed. Patient was admitted for a comprehensive rehab program   HPI Slipped on wet leaves and fell on buttocks without injury Amb with QC but does not use in house, wife states pt furniture walks  Mod I dressing , and bathing  Takes care of 24 cats Wife does laundry, pt makes himself breakfast  Patient feels that if he works more he can regain the strength in his arm.  He continues to have a subluxation but no shoulder pain.  The patient also has goals of return to prior to driving. The patient has an AFO at home but he never wears this his wife states that he has been encouraged to wear it but has not since he finished therapy.  He has completed his outpatient PT and OT at [facility].  The patient has followed up with his endocrinologist as well as  his PCP and ophthalmologist.   Pain Inventory Average Pain 3 Pain Right Now 2 My pain is intermittent and aching  In the last 24 hours, has pain interfered with the following? General activity 3 Relation with others 5 Enjoyment of life 4 What TIME of day is your pain at its worst? evening and night Sleep (in general) Fair  Pain is worse with: bending, standing and some activites Pain improves with: medication Relief from Meds: 5  Mobility use a cane how many minutes can you walk? 30 ability to climb steps?  yes do you drive?  yes  Function retired I need assistance with the following:  meal prep and household duties  Neuro/Psych weakness trouble walking confusion  Prior Studies Any changes since last visit?  no  Physicians involved in your care Primary care . Endocrinologist, Opthalmologist, Cardiologist   Family History  Problem Relation Age of Onset  . Other Mother        healthy, lived to her 66's  . Prostate cancer Father        lived to his 7's  . Congestive Heart Failure Father   . Valvular heart disease Sister   . Prostate cancer Brother    Social History   Socioeconomic History  . Marital status: Married    Spouse name: Not on file  . Number of children: Not on file  . Years of education: Not on file  . Highest education level: Not on file  Occupational History  . Not on file  Tobacco Use  . Smoking status: Never Smoker  . Smokeless tobacco: Never Used  Substance and Sexual Activity  . Alcohol use: Never  . Drug use: Not on file  . Sexual activity: Not on file  Other Topics Concern  . Not on file  Social History Narrative  . Not on file   Social Determinants of Health   Financial Resource Strain:   . Difficulty of Paying Living Expenses:   Food Insecurity:   . Worried About Charity fundraiser in the Last Year:   . Arboriculturist in the Last Year:   Transportation Needs:   . Film/video editor (Medical):   Marland Kitchen Lack of  Transportation (Non-Medical):   Physical Activity:   . Days of Exercise per Week:   . Minutes of Exercise per Session:   Stress:   . Feeling of Stress :   Social Connections:   . Frequency of Communication with Friends and Family:   . Frequency of Social Gatherings with Friends and Family:   . Attends Religious Services:   . Active Member of Clubs or Organizations:   . Attends Archivist Meetings:   Marland Kitchen Marital Status:    No past surgical history on file. Past Medical History:  Diagnosis Date  . Stroke (cerebrum) ([place])    There were no vitals taken for this visit.  Opioid Risk Score:   Fall Risk Score:  `1  Depression screen PHQ 2/9  Depression screen [facility] 2/9 03/13/2019 02/06/2019  Decreased Interest 0 0  Down, Depressed, Hopeless 0 0  PHQ - 2 Score 0 0  Altered sleeping 2 2  Tired, decreased energy 2 3  Change in appetite 0 0  Feeling bad or failure about yourself  0 1  Trouble concentrating 1 1  Moving slowly or fidgety/restless 2 2  Suicidal thoughts 0 0  PHQ-9 Score 7 9     Review of Systems     Objective:   Physical Exam Vitals and nursing note reviewed.  Constitutional:      Appearance: Normal appearance. He is obese.  HENT:     Head: Normocephalic and atraumatic.  Eyes:     Extraocular Movements: Extraocular movements intact.     Conjunctiva/sclera: Conjunctivae normal.     Pupils: Pupils are equal, round, and reactive to light.  Skin:    General: Skin is warm and dry.  Neurological:     General: No focal deficit present.     Mental Status: He is alert and oriented to person, place, and time.  Psychiatric:        Mood and Affect: Mood normal.        Behavior: Behavior normal.   Gait without evidence of knee instability however he does have tendency towards toe drag and he does some compensatory circumduction of his right lower extremity.  Motor strength is 3 - at the right deltoid bicep tricep finger flexors and extensors 4/5 at the  right hip flexor knee extensor and 3 - at the ankle dorsiflexor and plantar flexor. He has decreased fine motor and finger thumb opposition on the right hand. Sensation is normal in the right hand and right leg. Motor strength is 5/5 at the left deltoid by stress of grip hip flexor knee extensor ankle dorsiflexion plantarflexion       Assessment & Plan:  1.  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  He would benefit from using the AFO to prevent falls as well as ankle inversion injuries.  He is resistant to using this however. I do not  think he needs any further PT OT at this time, plateau in progress at this stage post stroke is the norm. He is currently 9 months post stroke I do not think the patient should return to driving at this time his upper extremity and lower extremity coordination on the right side is poor and his strength is limited as well. He could potentially use left-sided accelerator and turn signals but this would require vehicle modifications. Physical medicine rehab follow-up on as-needed basis Continue follow-up with PCP, endocrinology, cardiology, neurology and ophthalmology

## 2019-10-04 NOTE — Patient Instructions (Addendum)
Recommend wearing Right ankle brace   No driving

## 2019-10-07 DIAGNOSIS — Y998 Other external cause status: Secondary | ICD-10-CM | POA: Diagnosis not present

## 2019-10-07 DIAGNOSIS — R52 Pain, unspecified: Secondary | ICD-10-CM | POA: Diagnosis not present

## 2019-10-07 DIAGNOSIS — Y26XXXA Exposure to smoke, fire and flames, undetermined intent, initial encounter: Secondary | ICD-10-CM | POA: Diagnosis not present

## 2019-10-07 DIAGNOSIS — I10 Essential (primary) hypertension: Secondary | ICD-10-CM | POA: Diagnosis not present

## 2019-10-07 DIAGNOSIS — T24232A Burn of second degree of left lower leg, initial encounter: Secondary | ICD-10-CM | POA: Diagnosis not present

## 2019-10-07 DIAGNOSIS — Z23 Encounter for immunization: Secondary | ICD-10-CM | POA: Diagnosis not present

## 2019-10-07 DIAGNOSIS — T310 Burns involving less than 10% of body surface: Secondary | ICD-10-CM | POA: Diagnosis not present

## 2019-10-07 DIAGNOSIS — R0902 Hypoxemia: Secondary | ICD-10-CM | POA: Diagnosis not present

## 2019-10-07 DIAGNOSIS — T300 Burn of unspecified body region, unspecified degree: Secondary | ICD-10-CM | POA: Insufficient documentation

## 2019-10-07 DIAGNOSIS — T23201A Burn of second degree of right hand, unspecified site, initial encounter: Secondary | ICD-10-CM | POA: Diagnosis not present

## 2019-10-07 HISTORY — DX: Burn of unspecified body region, unspecified degree: T30.0

## 2019-10-16 DIAGNOSIS — T300 Burn of unspecified body region, unspecified degree: Secondary | ICD-10-CM | POA: Diagnosis not present

## 2019-10-16 DIAGNOSIS — R0902 Hypoxemia: Secondary | ICD-10-CM | POA: Diagnosis not present

## 2019-10-16 DIAGNOSIS — T23201A Burn of second degree of right hand, unspecified site, initial encounter: Secondary | ICD-10-CM | POA: Diagnosis not present

## 2019-10-16 DIAGNOSIS — T23202A Burn of second degree of left hand, unspecified site, initial encounter: Secondary | ICD-10-CM | POA: Diagnosis not present

## 2019-10-16 DIAGNOSIS — T310 Burns involving less than 10% of body surface: Secondary | ICD-10-CM | POA: Diagnosis not present

## 2019-10-16 DIAGNOSIS — X088XXA Exposure to other specified smoke, fire and flames, initial encounter: Secondary | ICD-10-CM | POA: Diagnosis not present

## 2019-10-16 DIAGNOSIS — Y998 Other external cause status: Secondary | ICD-10-CM | POA: Diagnosis not present

## 2019-10-16 DIAGNOSIS — T24232A Burn of second degree of left lower leg, initial encounter: Secondary | ICD-10-CM | POA: Diagnosis not present

## 2019-10-16 DIAGNOSIS — R52 Pain, unspecified: Secondary | ICD-10-CM | POA: Diagnosis not present

## 2019-10-28 DIAGNOSIS — T23001D Burn of unspecified degree of right hand, unspecified site, subsequent encounter: Secondary | ICD-10-CM | POA: Diagnosis not present

## 2019-10-28 DIAGNOSIS — T310 Burns involving less than 10% of body surface: Secondary | ICD-10-CM | POA: Diagnosis not present

## 2019-10-28 DIAGNOSIS — T23201D Burn of second degree of right hand, unspecified site, subsequent encounter: Secondary | ICD-10-CM | POA: Diagnosis not present

## 2019-10-28 DIAGNOSIS — T24002D Burn of unspecified degree of unspecified site of left lower limb, except ankle and foot, subsequent encounter: Secondary | ICD-10-CM | POA: Diagnosis not present

## 2019-10-28 DIAGNOSIS — T24202D Burn of second degree of unspecified site of left lower limb, except ankle and foot, subsequent encounter: Secondary | ICD-10-CM | POA: Diagnosis not present

## 2019-11-11 DIAGNOSIS — T24002D Burn of unspecified degree of unspecified site of left lower limb, except ankle and foot, subsequent encounter: Secondary | ICD-10-CM | POA: Diagnosis not present

## 2019-11-11 DIAGNOSIS — T310 Burns involving less than 10% of body surface: Secondary | ICD-10-CM | POA: Diagnosis not present

## 2019-11-11 DIAGNOSIS — T23201D Burn of second degree of right hand, unspecified site, subsequent encounter: Secondary | ICD-10-CM | POA: Diagnosis not present

## 2019-11-11 DIAGNOSIS — T24202D Burn of second degree of unspecified site of left lower limb, except ankle and foot, subsequent encounter: Secondary | ICD-10-CM | POA: Diagnosis not present

## 2019-11-11 DIAGNOSIS — T23001D Burn of unspecified degree of right hand, unspecified site, subsequent encounter: Secondary | ICD-10-CM | POA: Diagnosis not present

## 2019-12-09 ENCOUNTER — Other Ambulatory Visit: Payer: Self-pay

## 2019-12-09 ENCOUNTER — Encounter: Payer: Self-pay | Admitting: Internal Medicine

## 2019-12-09 ENCOUNTER — Ambulatory Visit: Payer: Medicare HMO | Admitting: Internal Medicine

## 2019-12-09 VITALS — BP 120/70 | HR 56 | Ht 71.5 in | Wt 218.0 lb

## 2019-12-09 DIAGNOSIS — E1165 Type 2 diabetes mellitus with hyperglycemia: Secondary | ICD-10-CM | POA: Diagnosis not present

## 2019-12-09 DIAGNOSIS — E1159 Type 2 diabetes mellitus with other circulatory complications: Secondary | ICD-10-CM | POA: Diagnosis not present

## 2019-12-09 DIAGNOSIS — E785 Hyperlipidemia, unspecified: Secondary | ICD-10-CM | POA: Diagnosis not present

## 2019-12-09 DIAGNOSIS — E663 Overweight: Secondary | ICD-10-CM | POA: Diagnosis not present

## 2019-12-09 LAB — POCT GLYCOSYLATED HEMOGLOBIN (HGB A1C): Hemoglobin A1C: 6.7 % — AB (ref 4.0–5.6)
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
## 2019-12-09 NOTE — Patient Instructions (Signed)
Please continue: - Lantus 25 units at bedtime.  Please return in 4 months with your sugar log.

## 2019-12-16 DIAGNOSIS — T300 Burn of unspecified body region, unspecified degree: Secondary | ICD-10-CM | POA: Diagnosis not present

## 2019-12-16 DIAGNOSIS — T24232D Burn of second degree of left lower leg, subsequent encounter: Secondary | ICD-10-CM | POA: Diagnosis not present

## 2019-12-16 DIAGNOSIS — T24232A Burn of second degree of left lower leg, initial encounter: Secondary | ICD-10-CM

## 2019-12-16 DIAGNOSIS — T23201D Burn of second degree of right hand, unspecified site, subsequent encounter: Secondary | ICD-10-CM | POA: Diagnosis not present

## 2019-12-16 DIAGNOSIS — T310 Burns involving less than 10% of body surface: Secondary | ICD-10-CM | POA: Diagnosis not present

## 2019-12-16 HISTORY — DX: Burn of second degree of left lower leg, initial encounter: T24.232A

## 2019-12-18 ENCOUNTER — Other Ambulatory Visit: Payer: Self-pay

## 2019-12-18 ENCOUNTER — Ambulatory Visit: Payer: Medicare HMO | Admitting: Adult Health

## 2019-12-18 ENCOUNTER — Encounter: Payer: Self-pay | Admitting: Adult Health

## 2019-12-18 VITALS — BP 139/77 | HR 62 | Ht 71.0 in | Wt 219.0 lb

## 2019-12-18 DIAGNOSIS — E785 Hyperlipidemia, unspecified: Secondary | ICD-10-CM

## 2019-12-18 DIAGNOSIS — Z794 Long term (current) use of insulin: Secondary | ICD-10-CM | POA: Diagnosis not present

## 2019-12-18 DIAGNOSIS — I10 Essential (primary) hypertension: Secondary | ICD-10-CM

## 2019-12-18 DIAGNOSIS — E1149 Type 2 diabetes mellitus with other diabetic neurological complication: Secondary | ICD-10-CM

## 2019-12-18 DIAGNOSIS — I6330 Cerebral infarction due to thrombosis of unspecified cerebral artery: Secondary | ICD-10-CM

## 2019-12-18 NOTE — Patient Instructions (Addendum)
Continue aspirin 81 mg daily  for secondary stroke prevention  Continue to follow up with PCP regarding cholesterol, blood pressure and diabetes management   Continue to monitor blood pressure at home  Maintain strict control of hypertension with blood pressure goal below 130/90, diabetes with hemoglobin A1c goal below 6.5% and cholesterol with LDL cholesterol (bad cholesterol) goal below 70 mg/dL. I also advised the patient to eat a healthy diet with plenty of whole grains, cereals, fruits and vegetables, exercise regularly and maintain ideal body weight.  Overall stable from stroke standpoint, therefore recommend follow up as needed         Thank you for coming to see [place] at [facility]. I hope we have been able to provide you high quality care today.  You may receive a patient satisfaction survey over the next few weeks. We would appreciate your feedback and comments so that we may continue to improve ourselves and the health of our patients.

## 2019-12-18 NOTE — Progress Notes (Signed)
[facility] [address] [place]. [place] [address] [phone]       HOSPITAL FOLLOW UP NOTE  [name] Date of Birth:  12-May-1946 Medical Record Number:  [id number]   Reason for Referral:  hospital stroke follow up    CHIEF COMPLAINT:  Chief Complaint  Patient presents with  . Follow-up    rm 9here for a stroke f/u. pt has no new sx    HPI: Stroke admission 12/17/2018: RobertRobert A Allenis a 74 y.o.malewith history of diabetes mellitus, HTN, not compliant with medications who presented on 12/17/2018 with right sided weakness and facial droop. tPA Given at [facility] transferred to [facility].  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  He declined further cardioembolic work-up.  2D echo unremarkable.  Initiated aspirin 81 mg daily.  Did not recommend DAPT as he is resistant to Western medicine.  HTN stable.  LDL 166 but patient refused statin.  A1c 13.3 and recommended follow-up with PCP for uncontrolled DM.  Other stroke risk factors include advanced age but no prior history of stroke.  Other active problems include noncompliance with medications with resistance to Western medicine.  Residual deficits mild dysarthria, right lower facial weakness, and right hemiparesis therefore discharged to CIR for ongoing therapy.  Initial visit 03/13/2019: He is being seen today for hospital follow-up accompanied by his wife.  Residual deficits of right hemiparesis, right facial droop, dysarthria and short-term memory concerns.  He continues to work with physical and occupational therapies at [facility] with ongoing improvement.  Continues to ambulate with rolling walker and unfortunately has had a few falls but no serious injury.  Wife endorses occasional difficulty  with swallowing as he will attempt to put too much food in his mouth at once which can cause coughing or choking.  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 he has not been followed by speech therapy.  He continues on aspirin 81 mg without bleeding or bruising.  Blood pressure today 121/68.  No further concerns at this time.  Update 06/19/2019: Robert Brewer is a 74 year old male who is being seen today for stroke follow-up accompianed by his wife.  He continues to have deficits of right hemiparesis, dysarthria and right-sided facial weakness.  His wife also endorses occasional difficulty with swallowing as discussed at prior visit.  He continues to work with PT/OT at [facility] with ongoing improvement.  Previously refused speech therapy.  Continues to ambulate with cane.  He does endorse right shoulder pain which has been interfering with therapy.  He has follow-up with physical medicine and rehab next month.  His wife endorses short-term memory impairment but has been stable.  He continues on aspirin without bleeding or bruising.  Blood pressure today initially elevated and on recheck 142/62.  He does not routinely monitor at home.  He has established care with endocrinologist and reports recent A1c 6.2.  It was recommended to pursue additional cardiac monitoring with cardiac event monitor to assess for potential atrial fibrillation but continues to refuse.  Refuses statin therapy.  Patient questions possible use of ED medication as he was advised to his PCP to discuss further during today's visit regards to a neurological/stroke standpoint.  No further concerns at this time.  Update 12/18/2019:  Here for stroke follow up visit accompanied by wife Residual right sided weakness, dysarthria and occasional dysphagia Patient reports improvement and only mild deficits through wife  disagrees He ambulates with cane Continues on aspirin without bleeding or bruising Blood pressure today 139/77  Recent A1c 6.7 Continues to follow with PCP for management of HTN, HLD and DM No further concerns at this time     ROS:   14 system review of systems performed and negative with exception of slurred speech, swallowing difficulties, gait impairment, weakness  PMH:  Past Medical History:  Diagnosis Date  . Stroke (cerebrum) (HCC)     PSH: No past surgical history on file.  Social History:  Social History   Socioeconomic History  . Marital status: Married    Spouse name: Not on file  . Number of children: Not on file  . Years of education: Not on file  . Highest education level: Not on file  Occupational History  . Not on file  Tobacco Use  . Smoking status: Never Smoker  . Smokeless tobacco: Never Used  Substance and Sexual Activity  . Alcohol use: Never  . Drug use: Not on file  . Sexual activity: Not on file  Other Topics Concern  . Not on file  Social History Narrative  . Not on file   Social Determinants of Health   Financial Resource Strain:   . Difficulty of Paying Living Expenses:   Food Insecurity:   . Worried About Charity fundraiser in the Last Year:   . Arboriculturist in the Last Year:   Transportation Needs:   . Film/video editor (Medical):   Marland Kitchen Lack of Transportation (Non-Medical):   Physical Activity:   . Days of Exercise per Week:   . Minutes of Exercise per Session:   Stress:   . Feeling of Stress :   Social Connections:   . Frequency of Communication with Friends and Family:   . Frequency of Social Gatherings with Friends and Family:   . Attends Religious Services:   . Active Member of Clubs or Organizations:   . Attends Archivist Meetings:   Marland Kitchen Marital Status:   Intimate Partner Violence:   . Fear of Current or Ex-Partner:   . Emotionally Abused:   Marland Kitchen Physically Abused:   . Sexually Abused:     Family  History:  Family History  Problem Relation Age of Onset  . Other Mother        healthy, lived to her 84's  . Prostate cancer Father        lived to his 92's  . Congestive Heart Failure Father   . Valvular heart disease Sister   . Prostate cancer Brother     Medications:   Current Outpatient Medications on File Prior to Visit  Medication Sig Dispense Refill  . acetaminophen (TYLENOL) 500 MG tablet Take 1,000 mg by mouth daily.    Marland Kitchen aspirin EC 81 MG EC tablet Take 1 tablet (81 mg total) by mouth daily.    . Ibuprofen (ADVIL) 200 MG CAPS Take by mouth.    . Insulin Glargine (LANTUS) 100 UNIT/ML Solostar Pen Inject 25 Units into the skin daily. 15 mL 11   No current facility-administered medications on file prior to visit.    Allergies:  No Known Allergies   Physical Exam  Vitals:   12/18/19 1542  BP: 139/77  Pulse: 62  Weight: 219 lb (99.3 kg)  Height: 5\' 11"  (  1.803 m)   Body mass index is 30.54 kg/m. No exam data present   General: well developed, well nourished,  elderly Caucasian male, seated, in no evident distress Head: head normocephalic and atraumatic.   Neck: supple with no carotid or supraclavicular bruits Cardiovascular: irregular regular rate (ventricular bigeminy on telemetry monitoring during admission) and rhythm, no murmurs Musculoskeletal: no deformity Skin:  no rash/petichiae Vascular:  Normal pulses all extremities   Neurologic Exam Mental Status: Awake and fully alert. Mild dysarthria. Oriented to place and time. Recent and remote memory intact. Attention span, concentration and fund of knowledge appropriate.  Mood and affect appropriate.  Cranial Nerves: Fundoscopic exam reveals sharp disc margins. Pupils equal, briskly reactive to light. Extraocular movements full without nystagmus. Visual fields full to confrontation. Hearing intact. Facial sensation intact. Right facial droop.  Motor:  RUE: 4/5 with weak grip strength and increased tone RLE:  4+/5 with foot drop LUE: full strength LLE: full strength  Sensory.: intact to touch , pinprick , position and vibratory sensation.  Coordination: Rapid alternating movements normal in all extremities except decreased right hand dexterity. Finger-to-nose and heel-to-shin performed accurately bilaterally. Gait and Station: Arises from chair with mild difficulty. Stance is normal. Gait demonstrates hemiplegic and steppage gait with mild imbalance and use of cane Reflexes: 1+ and symmetric. Toes downgoing.       ASSESSMENT: Robert Brewer is a 74 y.o. 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  Patient declined further cardioembolic work-up.  Vascular risk factors include DM, HTN, HLD and medication noncompliance.  Residual deficits of right hemiparesis, mild dysphagia and dysarthria with ongoing improvement.      PLAN:  1. Embolic stroke:  -discussed importance of use of cane at all times and benefit with use of AFO brace (although patient declines use) as well as swallowing education to avoid aspiration  Continue aspirin 81 mg daily for secondary stroke prevention.  continues to decline further cardio work up due to unknown stroke etiology. refuses statin therapy for secondary stroke prevention.  Maintain strict control of hypertension with blood pressure goal below 130/90, diabetes with hemoglobin A1c goal below 6.5% and cholesterol with LDL cholesterol (bad cholesterol) goal below 70 mg/dL.  I also advised the patient to eat a healthy diet with plenty of whole grains, cereals, fruits and vegetables, exercise regularly with at least 30 minutes of continuous activity daily and maintain ideal body weight. 2. HTN: stable. Continue to follow with PCP for monitoring 3. HLD: Patient refuses statin use.  Continue to follow  with PCP for ongoing monitoring of levels 4. DMII: continue to follow with endocrinology for management    Overall stable from stroke standpoint and recommend follow up as needed   I spent 25 minutes of face-to-face and non-face-to-face time with patient and wife.  This included previsit chart review, lab review, study review, order entry, electronic health record documentation, patient education   Frann Rider, [facility]  [facility] [address] [place], [place] [address]  Phone [phone] Fax [phone] Note: This document was prepared with digital dictation and possible smart phrase technology. Any transcriptional errors that result from this process are unintentional.

## 2019-12-20 NOTE — Progress Notes (Signed)
I agree with the above plan 

## 2020-01-21 ENCOUNTER — Telehealth: Payer: Self-pay | Admitting: Internal Medicine

## 2020-01-21 MED ORDER — INSULIN GLARGINE 100 UNIT/ML SOLOSTAR PEN: 25.0000 [IU] | PEN_INJECTOR | Freq: Every day | SUBCUTANEOUS | 11 refills | Status: DC

## 2020-01-21 NOTE — Telephone Encounter (Signed)
Medication Refill Request  Did you call your pharmacy and request this refill first? Yes-Patient states the RX with refills was sent to the wrong PHARM-Patient requests the Rx with refills be sent as follows: Marland Kitchen If patient has not contacted pharmacy first, instruct them to do so for future refills.  . Remind them that contacting the pharmacy for their refill is the quickest method to get the refill.  . Refill policy also stated that it will take anywhere between 24-72 hours to receive the refill.    Name of medication?  Insulin Glargine (LANTUS) 100 UNIT/ML Solostar Pen  Is this a 90 day supply? No-RX lasts for 60 days per patient  Name and location of pharmacy?  [id number] [address], [place] Phone:  [phone]  Fax:  [phone]      . Is the request for diabetes test strips? No . If yes, what brand?N/A

## 2020-01-21 NOTE — Progress Notes (Signed)
Cardiology Office Note:    Date:  01/22/2020   ID:  [name], DOB [birth year], MRN [id number]  PCP:  Cher Nakai, MD  Cardiologist:  Shirlee More, MD    Referring MD: Cher Nakai, MD    ASSESSMENT:    1. Cerebrovascular accident (CVA), unspecified mechanism ([place])   2. Type 2 diabetes mellitus with other neurologic complication, with long-term current use of insulin ([place])   3. Hyperlipidemia LDL goal <70    PLAN:    In order of problems listed above:  1. Stable he is functionally improved he declines evaluation for atrial fibrillation declines lipid-lowering treatment. We will recheck an EKG in the office today I have asked him if he thinks he is in atrial fibrillation to let me know we can do 1 and I will plan to see routinely in 1 year 2. Diabetes is well controlled A1c at target 3. He refuses lipid-lowering treatment although his LDL is improved   Next appointment: 1 year   Medication Adjustments/Labs and Tests Ordered: Current medicines are reviewed at length with the patient today.  Concerns regarding medicines are outlined above.  No orders of the defined types were placed in this encounter.  No orders of the defined types were placed in this encounter.   Chief Complaint  Patient presents with  . Follow-up    After stroke we had recommended implantable loop recorder he declined    History of Present Illness:    Robert Brewer is a 74 y.o. male with a hx of  DM hypertension and  cryptogenic stroke who presented on 12/17/2018 with right sided weakness and facial droop. tPA Given at [facility] and transferred to [facility].  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  He declined further cardioembolic work-up.  He was seen Dr. Curt Bears EP 12/20/2018 at that time he mentioned  there is no arrhythmia noticed on telemetry advised implanted loop recorder patient declined.  He was last seen 07/24/2019.  He has refused statin therapy post stroke. Compliance with diet, lifestyle and medications: Yes however he refuses lipid-lowering therapy  Last visit we discussed about home devices to measure heart rhythm he did not do it and does not want to. He is refused an implanted loop recorder. I reviewed the thought process behind taking his statin post stroke offered him nonstatin lipid-lowering therapy and he again declines. He feels as if he is steadily improving no palpitation chest pain shortness of breath is doing activities like cutting the grass walking with a cane do an EKG in the office to assure is not having atrial fibrillation. His most recent lipids 09/18/2019 has a cholesterol 172 LDL 111 HDL 50 A1c is at target 6.7% he does take low-dose aspirin. He has declined Covid vaccine Past Medical History:  Diagnosis Date  . Stroke (cerebrum) ([place])     No past surgical history on file.  Current Medications: Current Meds  Medication Sig  . Accu-Chek FastClix Lancets MISC   . ACCU-CHEK SMARTVIEW test strip   . aspirin EC 81 MG EC tablet Take 1 tablet (81 mg total) by mouth daily.  . BD PEN NEEDLE NANO 2ND GEN 32G X 4 MM MISC   . insulin glargine (LANTUS) 100 UNIT/ML Solostar Pen Inject 25 Units into the skin daily.  . Multiple Vitamin (MULTIVITAMIN) tablet Take  1 tablet by mouth daily.     Allergies:   Patient has no known allergies.   Social History   Socioeconomic History  . Marital status: Married    Spouse name: Not on file  . Number of children: Not on file  . Years of education: Not on file  . Highest education level: Not on file  Occupational History  . Not on file  Tobacco Use  . Smoking status: Never Smoker  . Smokeless tobacco: Never Used  Vaping Use  . Vaping Use: Never used  Substance and Sexual Activity  . Alcohol use: Never  . Drug use: Not  on file  . Sexual activity: Not on file  Other Topics Concern  . Not on file  Social History Narrative  . Not on file   Social Determinants of Health   Financial Resource Strain:   . Difficulty of Paying Living Expenses:   Food Insecurity:   . Worried About Charity fundraiser in the Last Year:   . Arboriculturist in the Last Year:   Transportation Needs:   . Film/video editor (Medical):   Marland Kitchen Lack of Transportation (Non-Medical):   Physical Activity:   . Days of Exercise per Week:   . Minutes of Exercise per Session:   Stress:   . Feeling of Stress :   Social Connections:   . Frequency of Communication with Friends and Family:   . Frequency of Social Gatherings with Friends and Family:   . Attends Religious Services:   . Active Member of Clubs or Organizations:   . Attends Archivist Meetings:   Marland Kitchen Marital Status:      Family History: The patient's family history includes Congestive Heart Failure in his father; Other in his mother; Prostate cancer in his brother and father; Valvular heart disease in his sister. ROS:   Please see the history of present illness.    All other systems reviewed and are negative.  EKGs/Labs/Other Studies Reviewed:    The following studies were reviewed today:  EKG:  EKG ordered today and personally reviewed.  The ekg ordered today demonstrates sinus rhythm with APCs otherwise normal  Recent Labs: No results found for requested labs within last 8760 hours.  Recent Lipid Panel    Component Value Date/Time   CHOL 227 (H) 12/17/2018 0212   TRIG 81 12/17/2018 0212   HDL 45 12/17/2018 0212   CHOLHDL 5.0 12/17/2018 0212   VLDL 16 12/17/2018 0212   LDLCALC 166 (H) 12/17/2018 0212    Physical Exam:    VS:  BP (!) 160/78 (BP Location: Left Arm, Patient Position: Sitting, Cuff Size: Normal)   Pulse (!) 56   Ht 5\' 11"  (1.803 m)   Wt 216 lb 12.8 oz (98.3 kg)   SpO2 96%   BMI 30.24 kg/m     Wt Readings from Last 3  Encounters:  01/22/20 216 lb 12.8 oz (98.3 kg)  12/18/19 219 lb (99.3 kg)  12/09/19 218 lb (98.9 kg)    Repeat blood pressure by me 1 3280 GEN:  Well nourished, well developed in no acute distress HEENT: Normal NECK: No JVD; No carotid bruits LYMPHATICS: No lymphadenopathy CARDIAC: RRR, no murmurs, rubs, gallops RESPIRATORY:  Clear to auscultation without rales, wheezing or rhonchi  ABDOMEN: Soft, non-tender, non-distended MUSCULOSKELETAL:  No edema; No deformity  SKIN: Warm and dry NEUROLOGIC:  Alert and oriented x 3 PSYCHIATRIC:  Normal affect    Signed, Shirlee More, MD  01/22/2020 2:16 PM    [facility]

## 2020-01-21 NOTE — Telephone Encounter (Signed)
Chart reviewed.  This medication was last ordered June 2020 by ANOTHER provider not [place] so very unclear how it was sent to a wrong pharmacy when it has not been ordered for a year.  Patient has appointment with Dr. Cruzita Lederer in May 2021. RX refilled now.

## 2020-01-22 ENCOUNTER — Ambulatory Visit: Payer: Medicare HMO | Admitting: Cardiology

## 2020-01-22 ENCOUNTER — Other Ambulatory Visit: Payer: Self-pay

## 2020-01-22 ENCOUNTER — Encounter: Payer: Self-pay | Admitting: Cardiology

## 2020-01-22 VITALS — BP 160/78 | HR 56 | Ht 71.0 in | Wt 216.8 lb

## 2020-01-22 DIAGNOSIS — E1149 Type 2 diabetes mellitus with other diabetic neurological complication: Secondary | ICD-10-CM | POA: Diagnosis not present

## 2020-01-22 DIAGNOSIS — E785 Hyperlipidemia, unspecified: Secondary | ICD-10-CM

## 2020-01-22 DIAGNOSIS — Z794 Long term (current) use of insulin: Secondary | ICD-10-CM | POA: Diagnosis not present

## 2020-01-22 DIAGNOSIS — I639 Cerebral infarction, unspecified: Secondary | ICD-10-CM

## 2020-01-22 NOTE — Patient Instructions (Signed)

## 2020-04-16 ENCOUNTER — Encounter: Payer: Self-pay | Admitting: Internal Medicine

## 2020-04-16 ENCOUNTER — Other Ambulatory Visit: Payer: Self-pay

## 2020-04-16 ENCOUNTER — Ambulatory Visit (INDEPENDENT_AMBULATORY_CARE_PROVIDER_SITE_OTHER): Payer: Medicare HMO | Admitting: Internal Medicine

## 2020-04-16 VITALS — BP 128/82 | HR 56 | Ht 71.0 in | Wt 215.0 lb

## 2020-04-16 DIAGNOSIS — E785 Hyperlipidemia, unspecified: Secondary | ICD-10-CM

## 2020-04-16 DIAGNOSIS — E663 Overweight: Secondary | ICD-10-CM | POA: Diagnosis not present

## 2020-04-16 DIAGNOSIS — E1159 Type 2 diabetes mellitus with other circulatory complications: Secondary | ICD-10-CM

## 2020-04-16 DIAGNOSIS — E1165 Type 2 diabetes mellitus with hyperglycemia: Secondary | ICD-10-CM | POA: Diagnosis not present

## 2020-04-16 LAB — POCT GLYCOSYLATED HEMOGLOBIN (HGB A1C): Hemoglobin A1C: 6.8 % — AB (ref 4.0–5.6)

## 2020-04-16 NOTE — Patient Instructions (Signed)
Please move: - Lantus 25 units to bedtime  Please return in 4 months with your sugar log.
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
## 2020-05-14 ENCOUNTER — Other Ambulatory Visit: Payer: Self-pay

## 2020-05-14 ENCOUNTER — Telehealth: Payer: Self-pay | Admitting: Internal Medicine

## 2020-05-14 MED ORDER — ACCU-CHEK FASTCLIX LANCETS MISC: 2 refills | Status: DC

## 2020-05-14 NOTE — Telephone Encounter (Signed)
Patient called stating his fast click pen broke and he needed a new one sent to his pharmacy-  [id number] - [place], [place] Phone:  [phone]  Fax:  [phone]     (Accu-Chek fast click pen)

## 2020-05-14 NOTE — Telephone Encounter (Signed)
Sent fast click to pharmacy.

## 2020-05-15 ENCOUNTER — Other Ambulatory Visit: Payer: Self-pay

## 2020-05-15 ENCOUNTER — Telehealth: Payer: Self-pay | Admitting: Internal Medicine

## 2020-05-15 MED ORDER — ACCU-CHEK FASTCLIX LANCETS MISC: 2 refills | Status: DC

## 2020-05-15 NOTE — Telephone Encounter (Signed)
RX sent to pharmacy  

## 2020-05-15 NOTE — Telephone Encounter (Signed)
Medication Refill Request  Did you call your pharmacy and request this refill first? YES   . If patient has not contacted pharmacy first, instruct them to do so for future refills.  . Remind them that contacting the pharmacy for their refill is the quickest method to get the refill.  . Refill policy also stated that it will take anywhere between 24-72 hours to receive the refill.    Name of medication? Accu Chek Lancer - Lancing Device  Is this a 90 day supply? N/A  [facility] [address] [facility] [place]

## 2020-05-18 ENCOUNTER — Other Ambulatory Visit: Payer: Self-pay

## 2020-05-18 MED ORDER — ACCU-CHEK SOFTCLIX LANCET DEV KIT: PACK | 0 refills | Status: DC

## 2020-06-03 ENCOUNTER — Other Ambulatory Visit: Payer: Self-pay

## 2020-06-03 ENCOUNTER — Telehealth: Payer: Self-pay

## 2020-06-03 MED ORDER — ACCU-CHEK SMARTVIEW VI STRP: ORAL_STRIP | 12 refills | Status: DC

## 2020-06-03 MED ORDER — ACCU-CHEK SOFTCLIX LANCET DEV KIT: PACK | 0 refills | Status: DC

## 2020-06-03 NOTE — Telephone Encounter (Signed)
RX sent to pharmacy  

## 2020-06-03 NOTE — Telephone Encounter (Signed)
New message     1. Which medications need to be refilled? (please list name of each medication and dose if known)   Lancets Misc. (ACCU-CHEK SOFTCLIX LANCET DEV) KIT  ACCU-CHEK SMARTVIEW test strip  2. Which pharmacy/location (including street and city if local pharmacy) is medication to be sent to?[facility]

## 2020-06-04 ENCOUNTER — Other Ambulatory Visit: Payer: Self-pay

## 2020-06-09 ENCOUNTER — Other Ambulatory Visit: Payer: Self-pay

## 2020-06-09 NOTE — Telephone Encounter (Signed)
Error

## 2020-06-22 ENCOUNTER — Other Ambulatory Visit: Payer: Self-pay

## 2020-06-22 ENCOUNTER — Telehealth: Payer: Self-pay | Admitting: Internal Medicine

## 2020-06-22 MED ORDER — BD PEN NEEDLE NANO 2ND GEN 32G X 4 MM MISC: 12 refills | Status: DC

## 2020-06-22 NOTE — Telephone Encounter (Signed)
Pt request:  NEEDLES BD Nano second generation. PHARMACY:  [facility] [address] [place]. Fax [phone]

## 2020-06-22 NOTE — Telephone Encounter (Signed)
RX sent to pharmacy  

## 2020-08-12 ENCOUNTER — Other Ambulatory Visit: Payer: Self-pay

## 2020-08-13 ENCOUNTER — Ambulatory Visit (INDEPENDENT_AMBULATORY_CARE_PROVIDER_SITE_OTHER): Payer: Medicare HMO | Admitting: Internal Medicine

## 2020-08-13 ENCOUNTER — Encounter: Payer: Self-pay | Admitting: Internal Medicine

## 2020-08-13 VITALS — BP 130/82 | HR 56 | Ht 71.0 in | Wt 220.0 lb

## 2020-08-13 DIAGNOSIS — E785 Hyperlipidemia, unspecified: Secondary | ICD-10-CM

## 2020-08-13 DIAGNOSIS — E1165 Type 2 diabetes mellitus with hyperglycemia: Secondary | ICD-10-CM

## 2020-08-13 DIAGNOSIS — E669 Obesity, unspecified: Secondary | ICD-10-CM | POA: Diagnosis not present

## 2020-08-13 DIAGNOSIS — E1159 Type 2 diabetes mellitus with other circulatory complications: Secondary | ICD-10-CM

## 2020-08-13 LAB — LIPID PANEL
Cholesterol: 183 mg/dL (ref 0–200)
HDL: 38.6 mg/dL — ABNORMAL LOW (ref >39.00)
LDL Cholesterol: 113 mg/dL — ABNORMAL HIGH (ref 0–99)
NonHDL: 144.56
Total CHOL/HDL Ratio: 5
Triglycerides: 156 mg/dL — ABNORMAL HIGH (ref 0.0–149.0)
VLDL: 31.2 mg/dL (ref 0.0–40.0)

## 2020-08-13 LAB — MICROALBUMIN / CREATININE URINE RATIO
Creatinine,U: 85.3 mg/dL
Microalb Creat Ratio: 0.8 mg/g (ref 0.0–30.0)
Microalb, Ur: <0.7 mg/dL (ref 0.0–1.9)

## 2020-08-13 LAB — COMPREHENSIVE METABOLIC PANEL
ALT: 16 U/L (ref 0–53)
AST: 17 U/L (ref 0–37)
Albumin: 4.5 g/dL (ref 3.5–5.2)
Alkaline Phosphatase: 64 U/L (ref 39–117)
BUN: 17 mg/dL (ref 6–23)
CO2: 30 mEq/L (ref 19–32)
Calcium: 9.5 mg/dL (ref 8.4–10.5)
Chloride: 103 mEq/L (ref 96–112)
Creatinine, Ser: 0.83 mg/dL (ref 0.40–1.50)
GFR: 87.6 mL/min (ref >60.00)
Glucose, Bld: 100 mg/dL — ABNORMAL HIGH (ref 70–99)
Potassium: 4.3 mEq/L (ref 3.5–5.1)
Sodium: 136 mEq/L (ref 135–145)
Total Bilirubin: 1.2 mg/dL (ref 0.2–1.2)
Total Protein: 7.1 g/dL (ref 6.0–8.3)

## 2020-08-13 LAB — POCT GLYCOSYLATED HEMOGLOBIN (HGB A1C): Hemoglobin A1C: 6.8 % — AB (ref 4.0–5.6)

## 2020-08-13 NOTE — Progress Notes (Signed)
Patient ID: Robert Brewer, male   DOB: 11/21/1945, 75 y.o.   MRN: [id number]   This visit occurred during the SARS-CoV-2 public health emergency.  Safety protocols were in place, including screening questions prior to the visit, additional usage of staff PPE, and extensive cleaning of exam room while observing appropriate contact time as indicated for disinfecting solutions.   HPI: Robert Brewer is a 75 y.o.-year-old male, initially referred by Cathlyn Parsons, PA-C, returning for follow-up DM2, dx in 2009, insulin-dependent since 12/2018, uncontrolled, with complications (stroke 65/9935).  His wife, who is also my patient, accompanies patient today and offers more information about his medications, blood sugars, and dietary PCP: Cher Nakai, MD, [place]  He has a history of uncontrolled diabetes, for which he refused medicines until his stroke in 11/2018.  After this he was started on insulin.  He refused addition of other medicines afterwards.  Reviewed HbA1c levels Lab Results  Component Value Date   HGBA1C 6.8 (A) 04/16/2020   HGBA1C 6.7 (A) 12/09/2019   HGBA1C 6.8 (A) 09/09/2019   HGBA1C 6.2 (A) 06/10/2019   HGBA1C 6.5 (A) 03/19/2019   HGBA1C 13.3 (H) 12/17/2018   Pt is on a regimen of: - Lantus 25 units in am-started 12/2018 >> moved at night He was on JanuMet in the past, but not in last 3 years.  Pt checks his sugars once a day: - am:  110-178, 186, 214, 233 (missed insulin) >> 112-174, 208 >> 95-162, 170 - 2h after b'fast: n/c >> 133 >> n/c - before lunch: 85, 100-147, 151, 194 >> 127-170 >> 99-155, 173 - 2h after lunch: n/c - before dinner: n/c >> 97-114, 181 >> n/c >> 93 - 2h after dinner: n/c >> 163 >> n/c  - bedtime: n/c >> 132, 149 >> n/c >> 253 >> 198 - nighttime: n/c Lowest sugar was 66 in the hospital >>...85 >> 117>> 93; he has hypoglycemia awareness in the 70s.  Highest sugar was 233 >> 208 >> 198.  Glucometer: ReliOn  Pt's meals are: - Breakfast: whole  wheat cereal with milk, sometimes egg + toast + bacon; oatmeal or grits - Lunch: none - Dinner: steak + potatoes + veggies; pizza; salad + mayo - Snacks: PB sandwich + milk He was drinking a lot of regular sodas and milk before the stroke, also eating a lot of icecream.  He stopped these after his stroke.  No CKD, last BUN/creatinine:  07/25/2019: 16/0.71, ACR 3 Lab Results  Component Value Date   BUN 20 12/25/2018   BUN 19 12/17/2018   CREATININE 0.78 12/25/2018   CREATININE 0.79 12/17/2018  Not on ACE inhibitor/ARB.  + HL; last set of lipids: 05/24/2019: 176/56/50/? 02/15/2019: LDL 60 Lab Results  Component Value Date   CHOL 227 (H) 12/17/2018   HDL 45 12/17/2018   LDLCALC 166 (H) 12/17/2018   TRIG 81 12/17/2018   CHOLHDL 5.0 12/17/2018  He was started on a statin after history of but he apparently stopped after LDL cholesterol decreased to 60.  He refused to restart it afterwards.  - last eye exam was in 07/2019: No DR. Coming up in 08/2020.  -No numbness and tingling in his feet.  Pt has FH of DM in brother.  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   Review latest TSH from 01/24/2019: 0.577, normal.  ROS: Constitutional: no weight gain/no weight loss, no fatigue,  no subjective hyperthermia, no subjective hypothermia Eyes: no blurry vision, no xerophthalmia ENT: no sore throat, no nodules palpated in neck, no dysphagia, no odynophagia, no hoarseness Cardiovascular: no CP/no SOB/no palpitations/no leg swelling Respiratory: no cough/no SOB/no wheezing Gastrointestinal: no N/no V/no D/no C/no acid reflux Musculoskeletal: no muscle aches/no joint aches Skin: no rashes, no hair loss Neurological: no tremors/no numbness/no tingling/no dizziness  I reviewed pt's medications, allergies, PMH, social hx, family hx, and changes were documented in the history of present  illness. Otherwise, unchanged from my initial visit note.  Past Medical History:  Diagnosis Date  . Stroke (cerebrum) ([facility])      No past surgical history on file.   Social History   Socioeconomic History  . Marital status: Married    Spouse name: Not on file  . Number of children: 0  . Years of education: Not on file  . Highest education level: Not on file  Occupational History  .  Retired  Engineer, production  . Financial resource strain: Not on file  . Food insecurity    Worry: Not on file    Inability: Not on file  . Transportation needs    Medical: Not on file    Non-medical: Not on file  Tobacco Use  . Smoking status: Never Smoker  . Smokeless tobacco: Never Used  Substance and Sexual Activity  . Alcohol use:  Beer    Frequency:  Rarely  . Drug use: No   Current Outpatient Medications on File Prior to Visit  Medication Sig Dispense Refill  . Accu-Chek FastClix Lancets MISC Use to check blood sugar 1-2 times daily 102 each 2  . ACCU-CHEK SMARTVIEW test strip Use to check blood sugar 1-2 times a day 100 each 12  . aspirin EC 81 MG EC tablet Take 1 tablet (81 mg total) by mouth daily.    . BD PEN NEEDLE NANO 2ND GEN 32G X 4 MM MISC Use to check blood sugar 1-2 times daily 200 each 12  . insulin glargine (LANTUS) 100 UNIT/ML Solostar Pen Inject 25 Units into the skin daily. 15 mL 11  . Lancets Misc. (ACCU-CHEK SOFTCLIX LANCET DEV) KIT Use to check 1-2 times daily 1 kit 0  . Multiple Vitamin (MULTIVITAMIN) tablet Take 1 tablet by mouth daily.     No current facility-administered medications on file prior to visit.   No Known Allergies Family History  Problem Relation Age of Onset  . Other Mother        healthy, lived to her 65's  . Prostate cancer Father        lived to his 81's  . Congestive Heart Failure Father   . Valvular heart disease Sister   . Prostate cancer Brother   + See HPI  PE: BP 130/82   Pulse (!) 56   Ht 5\' 11"  (1.803 m)   Wt 220 lb (99.8 kg)    SpO2 97%   BMI 30.68 kg/m  Wt Readings from Last 3 Encounters:  08/13/20 220 lb (99.8 kg)  04/16/20 215 lb (97.5 kg)  01/22/20 216 lb 12.8 oz (98.3 kg)   Constitutional: overweight, in NAD, walks with a cane Eyes: PERRLA, EOMI, no exophthalmos ENT: moist mucous membranes, no thyromegaly, no cervical lymphadenopathy Cardiovascular: RRR, No MRG, + edema in the right leg and arm Respiratory: CTA B Gastrointestinal: abdomen soft, NT, ND, BS+ Musculoskeletal: no deformities, strength intact in all 4 Skin: moist, warm, no rashes Neurological: no tremor with outstretched hands, DTR  normal in all 4  ASSESSMENT: 1. 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 HL  3.  Overweight  PLAN:  1. Patient with longstanding, type 2 diabetes, on basal insulin, with history of a stroke in 11/2017, after which he continues to have paresthesia in the right side of his body.  Diabetes control was very poor at the time of the stroke, with an HbA1c higher than 13%.  Afterwards, he accepted treatment with insulin only, but refused any other medication or referral to nutrition afterwards.  However, HbA1c improved significantly afterwards, with the lowest being 6.2%.  At last visit, however, blood sugars are higher, above goal in the is occasional.  Blood sugars at goal.  Upon questioning, he was actually taking his Lantus in the morning, not at night.  We moved the dose at night.  HbA1c at that time was 6.8%, slightly higher. -At today's visit, sugars have definitely improved at all times of the day, with only occasional blood sugars above target.  These mostly occur when he forgets his insulin.  I did advise him to check more sugars around dinnertime, but otherwise, I do not feel we need to look change his regimen.  We discussed about moving dinners earlier. - I suggested to:  Patient Instructions  Please  continue: - Lantus 25 units at night  Please return in 4 months with your sugar log.   - we checked his HbA1c: 6.8% (stable) - advised to check sugars at different times of the day - 1-2x a day, rotating check times - advised for yearly eye exams >> he is UTD - will check his annual labs today - return to clinic in 4 months  2. HL -Reviewed latest lipid panel from 11/2018: LDL very high: Lab Results  Component Value Date   CHOL 227 (H) 12/17/2018   HDL 45 12/17/2018   LDLCALC 166 (H) 12/17/2018   TRIG 81 12/17/2018   CHOLHDL 5.0 12/17/2018  -I strongly recommended statin in the past but he refused.  He apparently had another LDL checked in PCPs office and this decreased to  111 (I do not have these records). -We will check this today  3.  Obesity class 1 -He refused a GLP-1 receptor agonist and an SGLT2 inhibitor, which would have helped with weight loss -Weight was stable at last visit, now gained 5 lbs  Component     Latest Ref Rng & Units 08/13/2020          Sodium     135 - 145 mEq/L 136  Potassium     3.5 - 5.1 mEq/L 4.3  Chloride     96 - 112 mEq/L 103  CO2     19 - 32 mEq/L 30  Glucose     70 - 99 mg/dL 100 (H)  BUN     6 - 23 mg/dL 17  Creatinine     0.40 - 1.50 mg/dL 0.83  Total Bilirubin     0.2 - 1.2 mg/dL 1.2  Alkaline Phosphatase     39 - 117 U/L 64  AST     0 - 37 U/L 17  ALT     0 - 53 U/L 16  Total Protein     6.0 - 8.3 g/dL 7.1  Albumin     3.5 - 5.2 g/dL 4.5  GFR     >60.00 mL/min 87.60  Calcium  8.4 - 10.5 mg/dL 9.5  Cholesterol     0 - 200 mg/dL 183  Triglycerides     0.0 - 149.0 mg/dL 156.0 (H)  HDL Cholesterol     >39.00 mg/dL 38.60 (L)  VLDL     0.0 - 40.0 mg/dL 31.2  LDL (calc)     0 - 99 mg/dL 113 (H)  Total CHOL/HDL Ratio      5  NonHDL      144.56  Microalb, Ur     0.0 - 1.9 mg/dL <0.7  Creatinine,U     mg/dL 85.3  MICROALB/CREAT RATIO     0.0 - 30.0 mg/g 0.8   Tests are at goal, with the exception of a  slightly low HDL and an LDL higher than goal, but improved.  Unfortunately, he refuses statins.  I will suggest ezetimibe.  Philemon Kingdom, MD PhD [facility]

## 2020-08-13 NOTE — Patient Instructions (Signed)
Please continue: - Lantus 25 units at night  Please return in 4 months with your sugar log.  

## 2020-08-14 ENCOUNTER — Encounter: Payer: Self-pay | Admitting: Internal Medicine

## 2020-08-27 ENCOUNTER — Encounter: Payer: Self-pay | Admitting: Internal Medicine

## 2020-08-27 NOTE — Telephone Encounter (Signed)
T, I know we mailed him the labs but it appears that he did not get them. Let's send them again. Ty! C

## 2020-09-07 ENCOUNTER — Other Ambulatory Visit: Payer: Self-pay | Admitting: Internal Medicine

## 2020-09-08 DIAGNOSIS — E119 Type 2 diabetes mellitus without complications: Secondary | ICD-10-CM | POA: Diagnosis not present

## 2020-09-08 LAB — HM DIABETES EYE EXAM

## 2020-11-18 DIAGNOSIS — I10 Essential (primary) hypertension: Secondary | ICD-10-CM | POA: Diagnosis not present

## 2020-11-18 DIAGNOSIS — Z0000 Encounter for general adult medical examination without abnormal findings: Secondary | ICD-10-CM | POA: Diagnosis not present

## 2020-11-18 DIAGNOSIS — Z125 Encounter for screening for malignant neoplasm of prostate: Secondary | ICD-10-CM | POA: Diagnosis not present

## 2020-11-18 DIAGNOSIS — E1169 Type 2 diabetes mellitus with other specified complication: Secondary | ICD-10-CM | POA: Diagnosis not present

## 2020-12-02 DIAGNOSIS — Z139 Encounter for screening, unspecified: Secondary | ICD-10-CM | POA: Diagnosis not present

## 2020-12-02 DIAGNOSIS — Z9181 History of falling: Secondary | ICD-10-CM | POA: Diagnosis not present

## 2020-12-02 DIAGNOSIS — E785 Hyperlipidemia, unspecified: Secondary | ICD-10-CM | POA: Diagnosis not present

## 2020-12-02 DIAGNOSIS — Z1331 Encounter for screening for depression: Secondary | ICD-10-CM | POA: Diagnosis not present

## 2020-12-02 DIAGNOSIS — E669 Obesity, unspecified: Secondary | ICD-10-CM | POA: Diagnosis not present

## 2020-12-02 DIAGNOSIS — Z0000 Encounter for general adult medical examination without abnormal findings: Secondary | ICD-10-CM | POA: Diagnosis not present

## 2020-12-15 ENCOUNTER — Ambulatory Visit: Payer: Medicare HMO | Admitting: Internal Medicine

## 2021-01-09 IMAGING — MR MRI HEAD WITHOUT CONTRAST
12 of 13 series · 44 of 48 positions shown · non-contrast
Comparison: CT head 12/17/2018

CLINICAL DATA: Stroke

EXAM:
MRI HEAD WITHOUT CONTRAST
TECHNIQUE: Multiplanar, multiecho pulse sequences of the brain and surrounding
structures were obtained without intravenous contrast.

[Series 5: DWI · axial · 3.0mm · 0.92mm/px · z∈[-27,+124]mm · 7 of 104 slices shown (1 of 4)]
[im 1/104]
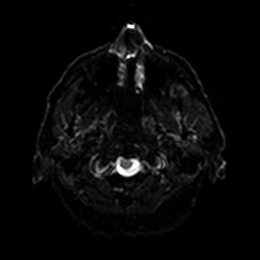
[im 18/104]
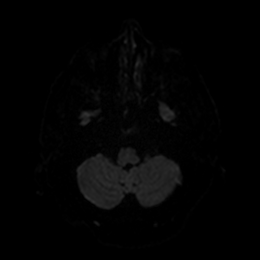
[im 35/104]
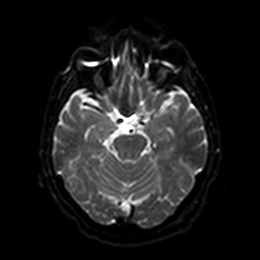
[im 52/104]
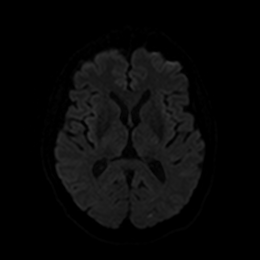
[im 69/104]
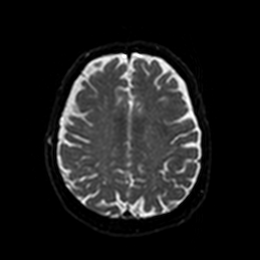
[im 86/104]
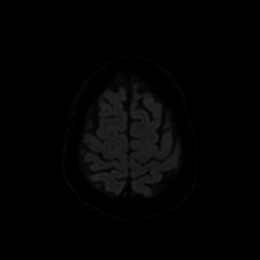
[im 104/104]
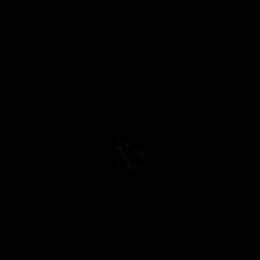

[Series 6: DWI · axial · 3.0mm · 0.92mm/px · z∈[-27,+124]mm · 4 of 51 slices shown (2 of 4)]
[im 1/51]
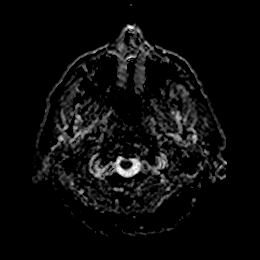
[im 17/51]
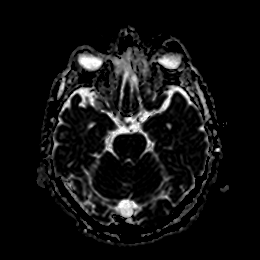
[im 34/51]
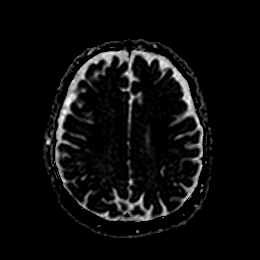
[im 51/51]
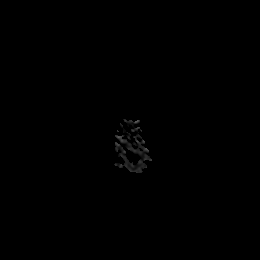

[Series 7: DWI · coronal · 4.0mm · 0.88mm/px · 6 of 82 slices shown (3 of 4)]
[im 1/82]
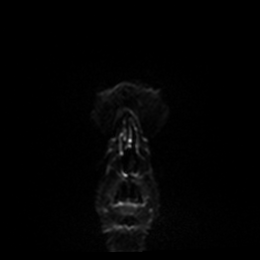
[im 17/82]
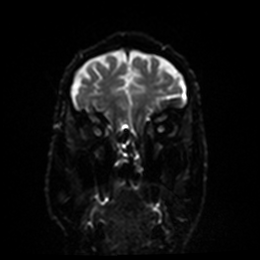
[im 33/82]
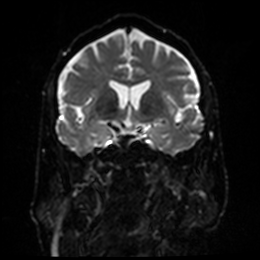
[im 49/82]
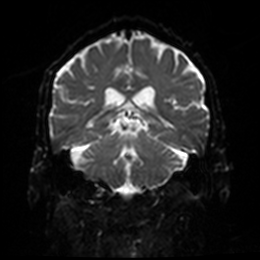
[im 65/82]
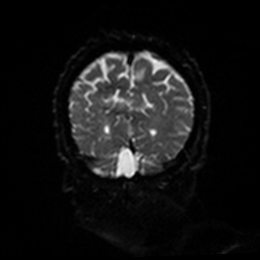
[im 82/82]
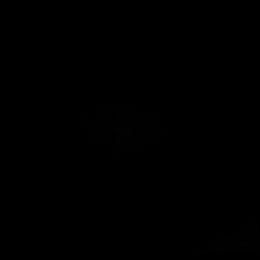

[Series 8: DWI · coronal · 4.0mm · 0.88mm/px · 3 of 40 slices shown (4 of 4)]
[im 1/40]
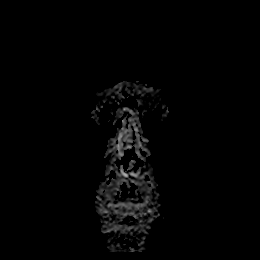
[im 20/40]
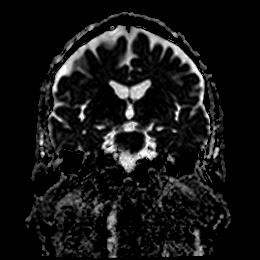
[im 40/40]
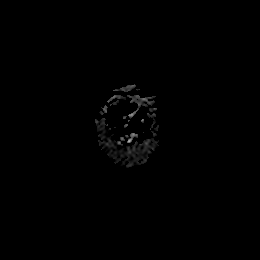

[Series 9: FLAIR · axial · 5.0mm · 0.45mm/px · z∈[-31,+123]mm · 2 of 27 slices shown]
[im 1/27]
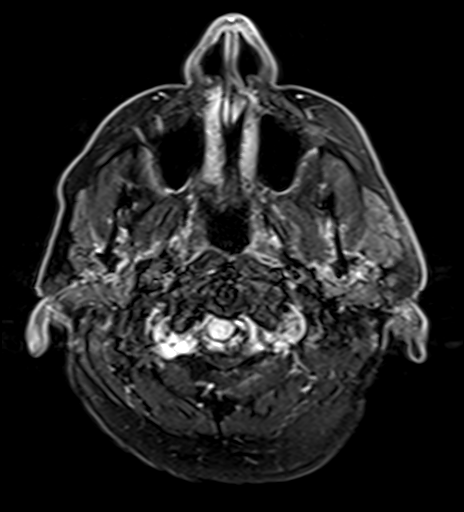
[im 27/27]
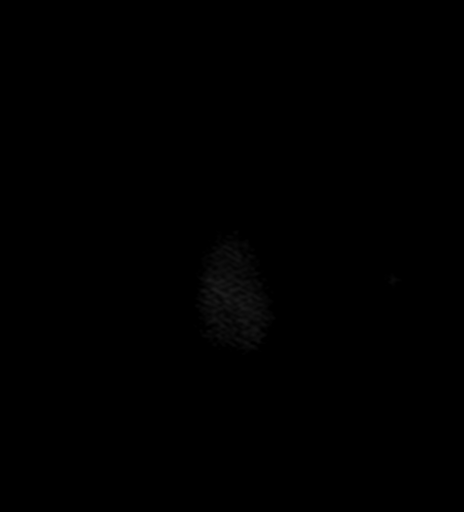

[Series 10: T1 · sagittal · 5.0mm · 0.75mm/px · 2 of 27 slices shown]
[im 1/27]
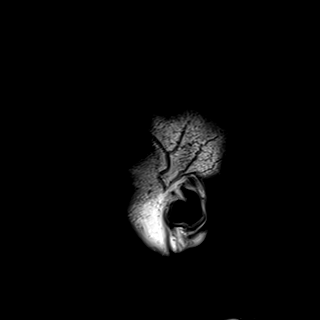
[im 27/27]
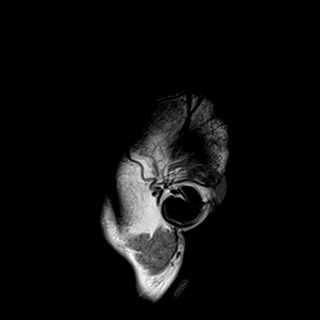

[Series 11: mag_images · axial · 3.0mm · 0.90mm/px · z∈[-36,+127]mm · 4 of 56 slices shown]
[im 1/56]
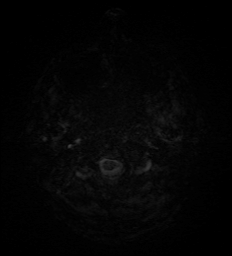
[im 19/56]
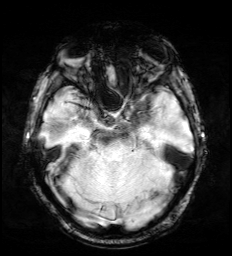
[im 37/56]
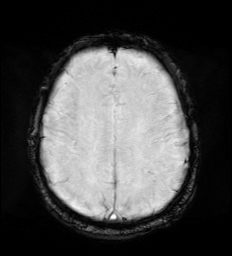
[im 56/56]
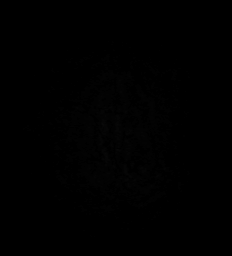

[Series 12: pha_images · axial · 3.0mm · 0.90mm/px · z∈[-36,+121]mm · 4 of 54 slices shown]
[im 1/54]
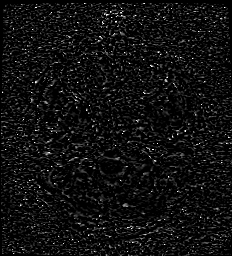
[im 18/54]
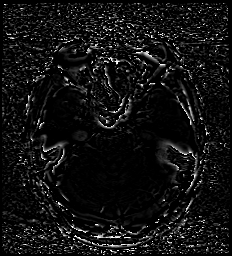
[im 36/54]
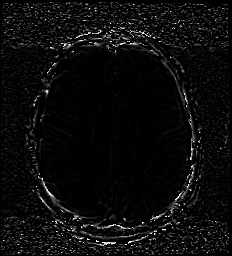
[im 54/54]
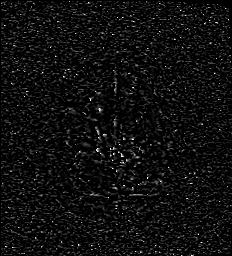

[Series 13: swi_images · axial · 3.0mm · 0.90mm/px · z∈[-36,+127]mm · 4 of 56 slices shown]
[im 1/56]
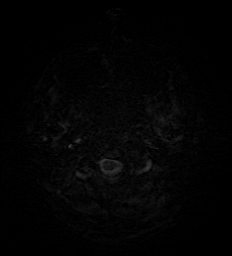
[im 19/56]
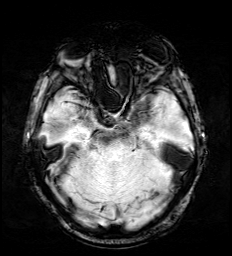
[im 37/56]
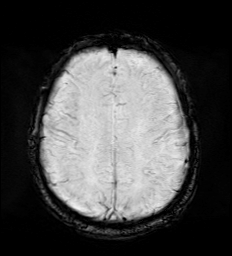
[im 56/56]
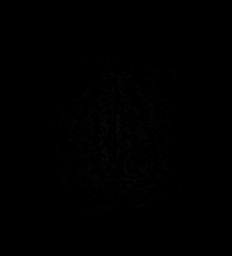

[Series 14: mip_images(sw) · axial · 24.0mm · 0.90mm/px · z∈[-25,+117]mm · 4 of 49 slices shown]
[im 1/49]
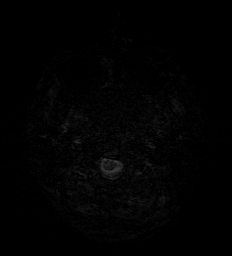
[im 17/49]
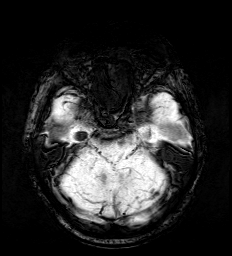
[im 33/49]
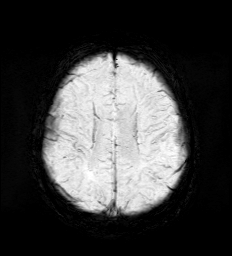
[im 49/49]
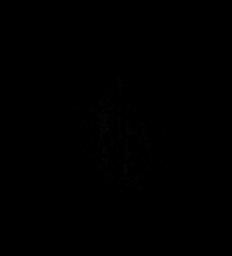

[Series 15: T2 · axial · 5.0mm · 0.72mm/px · z∈[-29,+124]mm · 2 of 27 slices shown (1 of 2)]
[im 1/27]
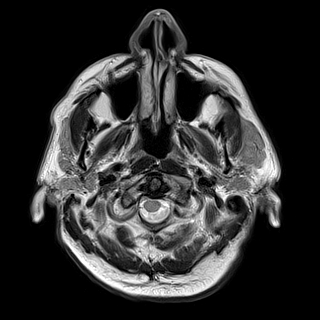
[im 27/27]
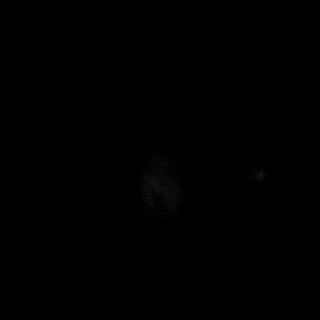

[Series 17: T2 · coronal · 5.0mm · 0.34mm/px · 2 of 30 slices shown (2 of 2)]
[im 1/30]
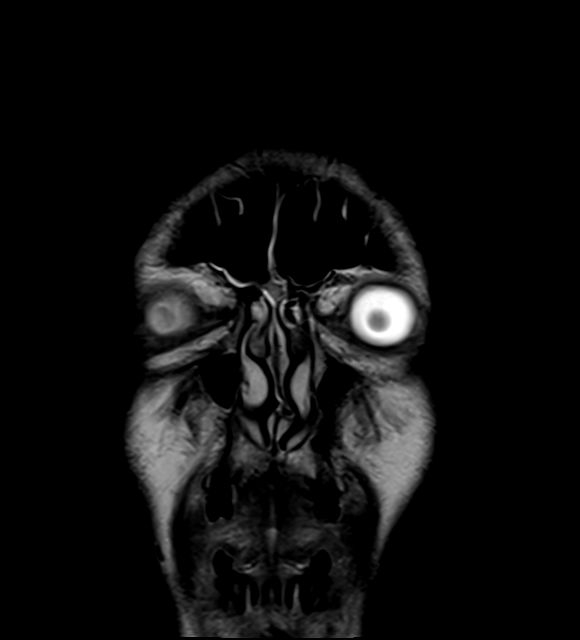
[im 30/30]
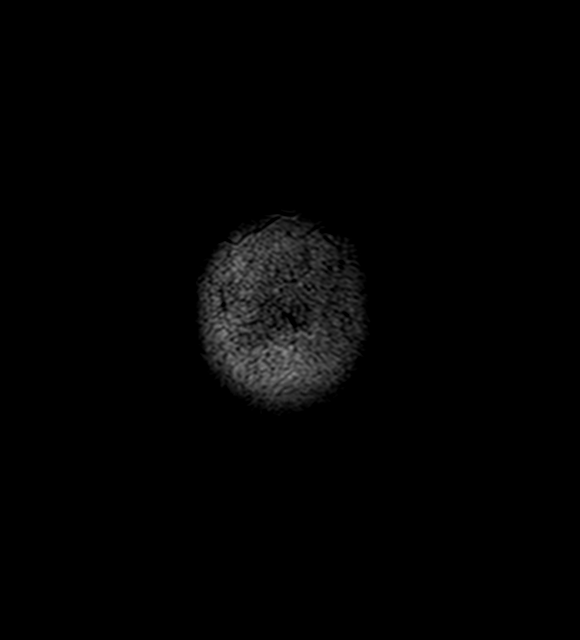

[44 of 48 positions shown; findings below may reference images not displayed]

FINDINGS: Brain: Moderately large acute infarct left paramedian pons. Small
acute cortical infarct left occipital lobe.

Negative for hemorrhage or mass lesion.  Ventricle size normal.

Vascular: Normal arterial flow voids

Skull and upper cervical spine: Negative

Sinuses/Orbits: Mild mucosal edema paranasal sinuses. Negative orbit

Other: None
IMPRESSION: Acute infarct left paramedian pons. Small acute cortical infarct
left occipital lobe. Negative for hemorrhage.

## 2021-01-27 NOTE — Progress Notes (Signed)
Date:  01/28/2021   ID:  Robert Brewer, DOB 21-Dec-1945, MRN [id number]  PCP:  Cher Nakai, MD  Cardiologist:  Shirlee More, MD    Referring MD: Cher Nakai, MD    ASSESSMENT:    1. Cerebrovascular accident (CVA), unspecified mechanism ([place])   2. Type 2 diabetes mellitus with other neurologic complication, with long-term current use of insulin (HCC)   3. Elevated blood pressure reading   4. Hyperlipidemia LDL goal <70    PLAN:    In order of problems listed above:  He has had no recurrent neurologic event he has declined evaluation including TEE implanted loop recorder has no evidence of atrial fibrillation we will continue aspirin for stroke prophylaxis and he now agrees to take a low-dose of a low intensity statin for class effect. Stable A1c is close to target most recently 7.1% continue current treatment insulin Stable BP at target not on antihypertensive agents Initiate lipid-lowering therapy   Next appointment: 1 year   Medication Adjustments/Labs and Tests Ordered: Current medicines are reviewed at length with the patient today.  Concerns regarding medicines are outlined above.  Orders Placed This Encounter  Procedures   EKG 12-Lead   Meds ordered this encounter  Medications   pravastatin (PRAVACHOL) 20 MG tablet    Sig: Take 1 tablet (20 mg total) by mouth 2 (two) times a week.    Dispense:  40 tablet    Refill:  3    Chief Complaint  Patient presents with   Annual Exam    History of Present Illness:     Robert Brewer is a 75 y.o. male with a hx of diabetes hypertension and cryptogenic stroke l who presented on 12/17/2018 with right sided weakness and facial droop. tPA Given at [facility] and transferred to [facility].  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  He was seen Dr. Curt Bears EP 12/20/2018 at that time he mentioned there is no arrhythmia noticed on telemetry advised implanted loop recorder patient declined.  He was last seen 01/22/2020 and his EKG showed sinus rhythm with APCs..  He has refused statin therapy post stroke.  Compliance with diet, lifestyle and medications: Yes  His wife is present participates in the evaluation decision making and we came to a decision that he will accept a low-dose of a low intensity statin 2 days a week. He has had no recurrent neurologic event no palpitations syncope chest pain shortness of breath or edema.  He was seen by endocrinology January 2022 again refuses lipid-lowering therapy with statin as A1c was stable at 6.8% and would not except SGLT2 inhibitor and GLP-1 agonist for his diabetes to optimize treatment. Past Medical History:  Diagnosis Date   Stroke (cerebrum) ([place])     No past surgical history on file.  Current Medications: Current Meds  Medication Sig   aspirin EC 81 MG EC tablet Take 1 tablet (81 mg total) by mouth daily.   BD PEN NEEDLE NANO 2ND GEN 32G X 4 MM MISC Use to check blood sugar 1-2 times daily   insulin glargine (LANTUS) 100 UNIT/ML Solostar Pen Inject 25 Units into the skin daily.   Lancets Misc. (ACCU-CHEK SOFTCLIX LANCET DEV) KIT Use to check 1-2 times daily   Multiple Vitamin (MULTIVITAMIN) tablet Take 1 tablet by mouth  daily.   pravastatin (PRAVACHOL) 20 MG tablet Take 1 tablet (20 mg total) by mouth 2 (two) times a week.   TRUEplus Lancets 33G MISC CHECK BLOOD SUGAR 1 TO 2 TIMES A DAY DX CODE E11.65     Allergies:   Patient has no known allergies.   Social History   Socioeconomic History   Marital status: Married    Spouse name: Not on file   Number of children: Not on file   Years of education: Not on file   Highest education level: Not on file  Occupational History   Not on file  Tobacco Use   Smoking status: Never   Smokeless  tobacco: Never  Vaping Use   Vaping Use: Never used  Substance and Sexual Activity   Alcohol use: Never   Drug use: Not on file   Sexual activity: Not on file  Other Topics Concern   Not on file  Social History Narrative   Not on file   Social Determinants of Health   Financial Resource Strain: Not on file  Food Insecurity: Not on file  Transportation Needs: Not on file  Physical Activity: Not on file  Stress: Not on file  Social Connections: Not on file     Family History: The patient's family history includes Congestive Heart Failure in his father; Other in his mother; Prostate cancer in his brother and father; Valvular heart disease in his sister. ROS:   Please see the history of present illness.    All other systems reviewed and are negative.  EKGs/Labs/Other Studies Reviewed:    The following studies were reviewed today:  EKG:  EKG ordered today and personally reviewed.  The ekg ordered today demonstrates sinus rhythm occasional APC otherwise normal  Recent Labs: 08/13/2020: ALT 16; BUN 17; Creatinine, Ser 0.83; Potassium 4.3; Sodium 136  Recent Lipid Panel    Component Value Date/Time   CHOL 183 08/13/2020 1537   TRIG 156.0 (H) 08/13/2020 1537   HDL 38.60 (L) 08/13/2020 1537   CHOLHDL 5 08/13/2020 1537   VLDL 31.2 08/13/2020 1537   LDLCALC 113 (H) 08/13/2020 1537    Physical Exam:    VS:  BP 110/82 (BP Location: Right Arm, Patient Position: Sitting, Cuff Size: Normal)   Pulse 68   Ht 5' 11"  (1.803 m)   Wt 220 lb (99.8 kg)   SpO2 96%   BMI 30.68 kg/m     Wt Readings from Last 3 Encounters:  01/28/21 220 lb (99.8 kg)  08/13/20 220 lb (99.8 kg)  04/16/20 215 lb (97.5 kg)     GEN:  Well nourished, well developed in no acute distress HEENT: Normal NECK: No JVD; No carotid bruits LYMPHATICS: No lymphadenopathy CARDIAC: RRR, no murmurs, rubs, gallops RESPIRATORY:  Clear to auscultation without rales, wheezing or rhonchi  ABDOMEN: Soft, non-tender,  non-distended MUSCULOSKELETAL:  No edema; No deformity  SKIN: Warm and dry NEUROLOGIC:  Alert and oriented x 3 PSYCHIATRIC:  Normal affect    Signed, Shirlee More, MD  01/28/2021 4:28 PM    [facility]

## 2021-01-28 ENCOUNTER — Encounter: Payer: Self-pay | Admitting: Cardiology

## 2021-01-28 ENCOUNTER — Other Ambulatory Visit: Payer: Self-pay

## 2021-01-28 ENCOUNTER — Ambulatory Visit: Payer: Medicare HMO | Admitting: Cardiology

## 2021-01-28 VITALS — BP 110/82 | HR 68 | Ht 71.0 in | Wt 220.0 lb

## 2021-01-28 DIAGNOSIS — E1149 Type 2 diabetes mellitus with other diabetic neurological complication: Secondary | ICD-10-CM

## 2021-01-28 DIAGNOSIS — E785 Hyperlipidemia, unspecified: Secondary | ICD-10-CM

## 2021-01-28 DIAGNOSIS — Z794 Long term (current) use of insulin: Secondary | ICD-10-CM | POA: Diagnosis not present

## 2021-01-28 DIAGNOSIS — I639 Cerebral infarction, unspecified: Secondary | ICD-10-CM

## 2021-01-28 DIAGNOSIS — R030 Elevated blood-pressure reading, without diagnosis of hypertension: Secondary | ICD-10-CM

## 2021-01-28 MED ORDER — PRAVASTATIN SODIUM 20 MG PO TABS: 20.0000 mg | ORAL_TABLET | ORAL | 3 refills | Status: DC

## 2021-01-28 NOTE — Patient Instructions (Signed)
Medication Instructions:  Your physician has recommended you make the following change in your medication:  START: Pravastatin 20 mg take one tablet by mouth twice weekly.  *If you need a refill on your cardiac medications before your next appointment, please call your pharmacy*   Lab Work: None If you have labs (blood work) drawn today and your tests are completely normal, you will receive your results only by: [place] (if you have MyChart) OR A paper copy in the mail If you have any lab test that is abnormal or we need to change your treatment, we will call you to review the results.   Testing/Procedures: None   Follow-Up: At [facility], you and your health needs are our priority.  As part of our continuing mission to provide you with exceptional heart care, we have created designated Provider Care Teams.  These Care Teams include your primary Cardiologist (physician) and Advanced Practice Providers (APPs -  Physician Assistants and Nurse Practitioners) who all work together to provide you with the care you need, when you need it.  We recommend signing up for the patient portal called "MyChart".  Sign up information is provided on this After Visit Summary.  MyChart is used to connect with patients for Virtual Visits (Telemedicine).  Patients are able to view lab/test results, encounter notes, upcoming appointments, etc.  Non-urgent messages can be sent to your provider as well.   To learn more about what you can do with MyChart, go to [email or web address].    Your next appointment:   1 year(s)  The format for your next appointment:   In Person  Provider:   Shirlee More, MD   Other Instructions

## 2021-02-04 ENCOUNTER — Encounter: Payer: Self-pay | Admitting: Internal Medicine

## 2021-02-05 ENCOUNTER — Ambulatory Visit (INDEPENDENT_AMBULATORY_CARE_PROVIDER_SITE_OTHER): Payer: Medicare HMO | Admitting: Internal Medicine

## 2021-02-05 ENCOUNTER — Encounter: Payer: Self-pay | Admitting: Internal Medicine

## 2021-02-05 ENCOUNTER — Other Ambulatory Visit: Payer: Self-pay

## 2021-02-05 VITALS — BP 140/88 | HR 49 | Ht 71.0 in | Wt 222.0 lb

## 2021-02-05 DIAGNOSIS — E1165 Type 2 diabetes mellitus with hyperglycemia: Secondary | ICD-10-CM

## 2021-02-05 DIAGNOSIS — E669 Obesity, unspecified: Secondary | ICD-10-CM | POA: Diagnosis not present

## 2021-02-05 DIAGNOSIS — E785 Hyperlipidemia, unspecified: Secondary | ICD-10-CM | POA: Diagnosis not present

## 2021-02-05 DIAGNOSIS — E1159 Type 2 diabetes mellitus with other circulatory complications: Secondary | ICD-10-CM | POA: Diagnosis not present

## 2021-02-05 LAB — POCT GLYCOSYLATED HEMOGLOBIN (HGB A1C): Hemoglobin A1C: 6.8 % — AB (ref 4.0–5.6)

## 2021-02-05 NOTE — Patient Instructions (Signed)
Please continue: - Lantus 25 units at night  Please return in 4 months with your sugar log.  

## 2021-02-05 NOTE — Progress Notes (Signed)
Patient ID: Robert Brewer, male   DOB: 12-12-1945, 75 y.o.   MRN: [id number]   This visit occurred during the SARS-CoV-2 public health emergency.  Safety protocols were in place, including screening questions prior to the visit, additional usage of staff PPE, and extensive cleaning of exam room while observing appropriate contact time as indicated for disinfecting solutions.   HPI: Robert Brewer is a 75 y.o.-year-old male, initially referred by Cathlyn Parsons, PA-C, returning for follow-up DM2, dx in 2009, insulin-dependent since 12/2018, uncontrolled, with complications (stroke [id number]).  His wife, who is also my patient, accompanies patient today and offers more information about his medications, blood sugars, and diet.  Last visit 6 months ago. PCP: Cher Nakai, MD, [place]  Interim history: No increased urination, blurry vision, nausea, chest pain. He continues to have weakness on the right side of his body and his equilibrium.  He is walking with a cane.  Reviewed HbA1c levels Lab Results  Component Value Date   HGBA1C 6.8 (A) 08/13/2020   HGBA1C 6.8 (A) 04/16/2020   HGBA1C 6.7 (A) 12/09/2019   HGBA1C 6.8 (A) 09/09/2019   HGBA1C 6.2 (A) 06/10/2019   HGBA1C 6.5 (A) 03/19/2019   HGBA1C 13.3 (H) 12/17/2018   Pt is on a regimen of: - Lantus 25 units in am-started 12/2018, after his stroke>> moved at night He was on JanuMet in the past, but not in last 3 years.  Pt checks his sugars once a day: - am:  110-178, 186, 214, 233 (missed insulin) >> 112-174, 208 >> 95-162, 170 >> 93-138, 172 - 2h after b'fast: n/c >> 133 >> n/c - before lunch: 85, 100-147, 151, 194 >> 127-170 >> 99-155, 173 >> 77, 99-150, 168 - 2h after lunch: n/c - before dinner: n/c >> 97-114, 181 >> n/c >> 93 >> n/c - 2h after dinner: n/c >> 163 >> n/c  - bedtime: n/c >> 132, 149 >> n/c >> 253 >> 198 >> n/c - nighttime: n/c Lowest sugar was 66 in the hospital >>...85 >> 117>> 93 >> 77; he has hypoglycemia  awareness in the 70s.  Highest sugar was 233 >> 208 >> 198 >> 177.  Glucometer: ReliOn  Pt's meals are: - Breakfast: whole wheat cereal with milk, sometimes egg + toast + bacon; oatmeal or grits - Lunch: none - Dinner: steak + potatoes + veggies; pizza; salad + mayo - Snacks: PB sandwich + milk He was drinking a lot of regular sodas and milk before the stroke, also eating a lot of icecream.  He stopped these after his stroke.  No CKD, last BUN/creatinine:  Lab Results  Component Value Date   BUN 17 08/13/2020   BUN 20 12/25/2018   CREATININE 0.83 08/13/2020   CREATININE 0.78 12/25/2018  Not on ACE inhibitor/ARB.  + HL; last set of lipids: Lab Results  Component Value Date   CHOL 183 08/13/2020   HDL 38.60 (L) 08/13/2020   LDLCALC 113 (H) 08/13/2020   TRIG 156.0 (H) 08/13/2020   CHOLHDL 5 08/13/2020  05/24/2019: 176/56/50/? 02/15/2019: LDL 60 He was started on a statin after history of but he apparently stopped after LDL cholesterol decreased to 60.  He refused to restart it afterwards.  She also refused to start Zetia, which I suggested at last visit. Dr. Bettina Gavia recommended Pravastatin 20 mg 2x a week. He did not start yet but plans to do so.  - last eye exam was in 08/2020: No DR  -No numbness and tingling  in his feet.  Pt has FH of DM in brother.  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   Review latest TSH from 01/24/2019: 0.577, normal.  ROS: + See HPI  Past Medical History:  Diagnosis Date   Stroke (cerebrum) ([place])      No past surgical history on file.   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     BD PEN NEEDLE NANO 2ND GEN 32G X 4 MM MISC Use to check blood sugar 1-2 times daily 200 each 12   insulin glargine (LANTUS) 100 UNIT/ML Solostar Pen Inject 25 Units into the skin daily. 15 mL 11   Lancets Misc. (ACCU-CHEK SOFTCLIX LANCET DEV) KIT Use to check 1-2 times daily 1 kit 0   Multiple Vitamin (MULTIVITAMIN) tablet Take 1 tablet by mouth daily.     pravastatin (PRAVACHOL) 20 MG tablet Take 1 tablet (20 mg total) by mouth 2 (two) times a week. 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   No Known Allergies Family History  Problem Relation Age of Onset   Other Mother        healthy, lived to her 1's   Prostate cancer Father        lived to his 6's   Congestive Heart Failure Father    Valvular heart disease Sister    Prostate cancer Brother   + See HPI  PE: BP 140/88 (BP Location: Left Arm, Patient Position: Sitting, Cuff Size: Normal)   Pulse (!) 49   Ht 5' 11"  (1.803 m)   Wt 222 lb (100.7 kg)   SpO2 98%   BMI 30.96 kg/m  Wt Readings from Last 3 Encounters:  02/05/21 222 lb (100.7 kg)  01/28/21 220 lb (99.8 kg)  08/13/20 220 lb (99.8 kg)   Constitutional: overweight, in NAD, walks with a cane Eyes: PERRLA, EOMI, no exophthalmos ENT: moist mucous membranes, no thyromegaly, no cervical lymphadenopathy Cardiovascular: RRR, No MRG, + edema in the right leg and  arm Respiratory: CTA B Gastrointestinal: abdomen soft, NT, ND, BS+ Musculoskeletal: no deformities, strength intact in all 4 Skin: moist, warm, no rashes Neurological: no tremor with outstretched hands, DTR normal in all 4  ASSESSMENT: 1. 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 HL  3.  Overweight  PLAN:  1. Patient with longstanding, type 2 diabetes, on basal insulin, as he refused other antidiabetic medications.  He has a history of a stroke in 11/2017, after which he continues to have paresthesia in the right side of his body.  His diabetes control was very poor at the time of her stroke, with an HbA1c higher than 13%.  Afterwards, he accepted treatment with insulin only.  HbA1c at last visit was stable, at 6.8%, at goal.  Sugars were improved at all times of the day with only occasional blood sugars above target.  These were occurring mostly when he forgot to take his insulin.  We discussed about moving insulin at dinnertime rather than at bedtime so that he does not fall asleep without taking it. -At today's visit, his blood sugars are usually at goal with very few mild hypoglycemic exceptions.  They appear to be improved from last visit.  We will not change his regimen. - I suggested to:  Patient Instructions  Please continue: - Lantus 25 units at night  Please return in 4 months with your sugar log.   - we checked his HbA1c: 6.8% (stable) - advised to check sugars at different times of the day - 1x a day, rotating check times - advised for yearly eye exams >> he is UTD - return to clinic in 4 months  2. HL -Reviewed latest lipid panel from 07/2020: LDL above our target of less than 70, triglycerides slightly high, HDL low: Lab Results  Component Value Date   CHOL 183 08/13/2020   HDL 38.60 (L) 08/13/2020   LDLCALC 113 (H) 08/13/2020   TRIG 156.0 (H) 08/13/2020    CHOLHDL 5 08/13/2020  -I strongly recommended a statin in the past, but he refused.  At last visit I recommended ezetimibe, but he cannot start this. He is planning to start Pravastatin 20 mg 2x a week as Rx'ed by cardiology.  3.  Obesity class 1 -Unfortunately, he refused a GLP-1 receptor agonist and an SGLT2 inhibitor, which would have helped with weight loss -He gained 5 pounds before last visit but weight stable since then  Philemon Kingdom, MD PhD [facility]

## 2021-02-06 ENCOUNTER — Other Ambulatory Visit: Payer: Self-pay | Admitting: Internal Medicine

## 2021-05-20 DIAGNOSIS — E669 Obesity, unspecified: Secondary | ICD-10-CM | POA: Diagnosis not present

## 2021-05-20 DIAGNOSIS — E663 Overweight: Secondary | ICD-10-CM | POA: Diagnosis not present

## 2021-05-20 DIAGNOSIS — K5909 Other constipation: Secondary | ICD-10-CM | POA: Diagnosis not present

## 2021-05-20 DIAGNOSIS — E1169 Type 2 diabetes mellitus with other specified complication: Secondary | ICD-10-CM | POA: Diagnosis not present

## 2021-05-20 DIAGNOSIS — Z2821 Immunization not carried out because of patient refusal: Secondary | ICD-10-CM | POA: Diagnosis not present

## 2021-05-20 DIAGNOSIS — Z8673 Personal history of transient ischemic attack (TIA), and cerebral infarction without residual deficits: Secondary | ICD-10-CM | POA: Diagnosis not present

## 2021-05-20 DIAGNOSIS — E785 Hyperlipidemia, unspecified: Secondary | ICD-10-CM | POA: Diagnosis not present

## 2021-05-20 DIAGNOSIS — I6930 Unspecified sequelae of cerebral infarction: Secondary | ICD-10-CM | POA: Diagnosis not present

## 2021-05-20 DIAGNOSIS — I10 Essential (primary) hypertension: Secondary | ICD-10-CM | POA: Diagnosis not present

## 2021-06-15 ENCOUNTER — Encounter: Payer: Self-pay | Admitting: Internal Medicine

## 2021-06-15 ENCOUNTER — Other Ambulatory Visit: Payer: Self-pay

## 2021-06-15 ENCOUNTER — Ambulatory Visit (INDEPENDENT_AMBULATORY_CARE_PROVIDER_SITE_OTHER): Payer: Medicare HMO | Admitting: Internal Medicine

## 2021-06-15 VITALS — BP 128/78 | HR 66 | Ht 71.0 in | Wt 223.2 lb

## 2021-06-15 DIAGNOSIS — E1159 Type 2 diabetes mellitus with other circulatory complications: Secondary | ICD-10-CM

## 2021-06-15 DIAGNOSIS — E785 Hyperlipidemia, unspecified: Secondary | ICD-10-CM

## 2021-06-15 DIAGNOSIS — E669 Obesity, unspecified: Secondary | ICD-10-CM

## 2021-06-15 DIAGNOSIS — E1165 Type 2 diabetes mellitus with hyperglycemia: Secondary | ICD-10-CM | POA: Diagnosis not present

## 2021-06-15 LAB — POCT GLYCOSYLATED HEMOGLOBIN (HGB A1C): Hemoglobin A1C: 6.8 % — AB (ref 4.0–5.6)

## 2021-06-15 NOTE — Patient Instructions (Addendum)
Please continue: - Lantus 25 units at night  Try to decrease and stop milk!  Please return in 4 months with your sugar log.

## 2021-06-15 NOTE — Progress Notes (Signed)
Patient ID: Robert Brewer, male   DOB: Dec 19, 1945, 75 y.o.   MRN: [id number]   This visit occurred during the SARS-CoV-2 public health emergency.  Safety protocols were in place, including screening questions prior to the visit, additional usage of staff PPE, and extensive cleaning of exam room while observing appropriate contact time as indicated for disinfecting solutions.   HPI: Robert Brewer is a 75 y.o.-year-old male, initially referred by Cathlyn Parsons, PA-C, returning for follow-up DM2, dx in 2009, insulin-dependent since 12/2018, uncontrolled, with complications (stroke [id number]).  His wife, who is also my patient, accompanies patient today and offers more information about his medications, blood sugars, and diet.  Last visit 4 months ago. PCP: Cher Nakai, MD, [place]  Interim history: No increased urination, blurry vision, nausea, chest pain.  He has diarrhea x 4 months. He is drinking 1 gallon of milk a week! He continues to have weakness on the right side of his body and disequilibrium.  He is walking with a cane.  Reviewed HbA1c levels Lab Results  Component Value Date   HGBA1C 6.8 (A) 02/05/2021   HGBA1C 6.8 (A) 08/13/2020   HGBA1C 6.8 (A) 04/16/2020   HGBA1C 6.7 (A) 12/09/2019   HGBA1C 6.8 (A) 09/09/2019   HGBA1C 6.2 (A) 06/10/2019   HGBA1C 6.5 (A) 03/19/2019   HGBA1C 13.3 (H) 12/17/2018   Pt is on a regimen of: - Lantus 25 units in am-started 12/2018, after his stroke>> moved at night He was on JanuMet in the past, but not in last 3 years.  Pt checks his sugars once a day: - am: 112-174, 208 >> 95-162, 170 >> 93-138, 172 >> 98-143, 162 - 2h after b'fast: n/c >> 133 >> n/c - before lunch: 99-155, 173 >> 77, 99-150, 168 >> 88, 95-152, 162, 190 - 2h after lunch: n/c - before dinner: n/c >> 97-114, 181 >> n/c >> 93 >> n/c  - 2h after dinner: n/c >> 163 >> n/c  - bedtime: n/c >> 132, 149 >> n/c >> 253 >> 198 >> n/c >> 182 - nighttime: n/c Lowest sugar was 66 in  the hospital >>... 93 >> 77 >> 88; he has hypoglycemia awareness in the 70s.  Highest sugar was 233 >> .Marland Kitchen. 177 >> 190.  Glucometer: ReliOn  Pt's meals are: - Breakfast: whole wheat cereal with milk, sometimes egg + toast + bacon; oatmeal or grits - Lunch: none - Dinner: steak + potatoes + veggies; pizza; salad + mayo - Snacks: PB sandwich + milk He was drinking a lot of regular sodas and milk before the stroke, also eating a lot of icecream.  He stopped these after his stroke. At this visit, he tells me he still drinks milk!  No CKD, last BUN/creatinine:  Lab Results  Component Value Date   BUN 17 08/13/2020   BUN 20 12/25/2018   CREATININE 0.83 08/13/2020   CREATININE 0.78 12/25/2018  Not on ACE inhibitor/ARB.  + HL; last set of lipids: Lab Results  Component Value Date   CHOL 183 08/13/2020   HDL 38.60 (L) 08/13/2020   LDLCALC 113 (H) 08/13/2020   TRIG 156.0 (H) 08/13/2020   CHOLHDL 5 08/13/2020  05/24/2019: 176/56/50/? 02/15/2019: LDL 60 He was started on a statin after history of but he apparently stopped after LDL cholesterol decreased to 60.  He refused to restart it afterwards.  She also refused to start Zetia, which I suggested at last visit. Dr. Bettina Gavia recommended Pravastatin 20 mg 2x a  week. He started this.  - last eye exam was in 08/2020: No DR  -No numbness and tingling in his feet.  Pt has FH of DM in brother.  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   TSH from 01/24/2019: 0.577, normal.  ROS: + See HPI  Past Medical History:  Diagnosis Date   Stroke (cerebrum) ([place])      No past surgical history on file.   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     BD PEN NEEDLE NANO 2ND GEN 32G X 4 MM MISC Use to check blood sugar 1-2 times daily 200 each 12   Lancets Misc. (ACCU-CHEK SOFTCLIX LANCET DEV) KIT Use to check 1-2 times daily 1 kit 0   LANTUS SOLOSTAR 100 UNIT/ML Solostar Pen INJECT 25 UNITS INTO THE SKIN DAILY. 15 mL 11   Multiple Vitamin (MULTIVITAMIN) tablet Take 1 tablet by mouth daily.     pravastatin (PRAVACHOL) 20 MG tablet Take 1 tablet (20 mg total) by mouth 2 (two) times a week. 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   No Known Allergies Family History  Problem Relation Age of Onset   Other Mother        healthy, lived to her 12's   Prostate cancer Father        lived to his 71's   Congestive Heart Failure Father    Valvular heart disease Sister    Prostate cancer Brother   + See HPI  PE: BP 128/78 (BP Location: Left Arm, Patient Position: Sitting, Cuff Size: Normal)   Pulse 66   Ht 5' 11"  (1.803 m)   Wt 223 lb 3.2 oz (101.2 kg)   SpO2 95%   BMI 31.13 kg/m  Wt Readings from Last 3 Encounters:  06/15/21 223 lb 3.2 oz (101.2 kg)  02/05/21 222 lb (100.7 kg)  01/28/21 220 lb (99.8 kg)   Constitutional: overweight, in NAD, walks with a cane Eyes: PERRLA, EOMI, no exophthalmos ENT: moist mucous membranes, no thyromegaly, no cervical lymphadenopathy Cardiovascular:  RRR, No MRG, + edema in the right leg and arm Respiratory: CTA B Gastrointestinal: abdomen soft, NT, ND, BS+ Musculoskeletal: no deformities, strength intact in all 4 Skin: moist, warm, no rashes Neurological: no tremor with outstretched hands, DTR normal in all 4  ASSESSMENT: 1. 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 HL  3.  Overweight  PLAN:  1. Patient with longstanding, type 2 diabetes, on basal insulin, as he refused other antidiabetic medications.  He has a history of a stroke in 11/2017, after which she continues to have paresthesia in the right side of his body.  His diabetes control is very poor at the time of her stroke, with an HbA1c higher than 17%.  Afterwards, he excepted treatment with insulin only.  HbA1c at last visit was stable, at goal, at 6.8%.  At that time, blood sugars were usually at goal with few mild hyperglycemic exceptions, but improved from the previous visit.  We did not change his regimen at that time. -At this visit, reviewing his blood sugars at home, they are similar to before, mostly at goal with occasional spikes slightly above target.  Upon questioning, he is still drinking 1 gallon of milk a week.  I strongly advised him to stop this!  Explained the risks of continuing with this practice including worsening diabetes.  Otherwise, we can continue the same regimen for now per his preference. - I suggested to:  Patient Instructions  Please continue: - Lantus 25 units at night  Please return in 4 months with your sugar log.   - we checked his HbA1c: 6.8% (stable) - advised to check sugars at different times of the day - 1x a day, rotating check times - advised for yearly eye exams >> he is UTD - return to clinic in 4 months  2. HL -Reviewed latest lipid panel from 07/2020: LDL above target of less than 70, triglycerides slightly high, HDL  low: Lab Results  Component Value Date   CHOL 183 08/13/2020   HDL 38.60 (L) 08/13/2020   LDLCALC 113 (H) 08/13/2020   TRIG 156.0 (H) 08/13/2020   CHOLHDL 5 08/13/2020  -I strongly recommended a statin in the past, but he refused.  I recommended ezetimibe but he did not start this.  He was planning to stop pravastatin 20 mg twice a week as prescribed by cardiology at last visit.  He started this >> no SEs.  3.  Obesity class 1 -Unfortunately, he refused a GLP-1 receptor agonist and this just to eat better which would have helped with weight loss -Weight was stable at last visit and again today.  Philemon Kingdom, MD PhD [facility]

## 2021-08-24 ENCOUNTER — Other Ambulatory Visit: Payer: Self-pay

## 2021-08-24 ENCOUNTER — Ambulatory Visit (INDEPENDENT_AMBULATORY_CARE_PROVIDER_SITE_OTHER): Payer: Medicare HMO | Admitting: Nurse Practitioner

## 2021-08-24 ENCOUNTER — Encounter: Payer: Self-pay | Admitting: Nurse Practitioner

## 2021-08-24 VITALS — BP 165/74 | HR 60 | Temp 98.7°F | Ht 70.87 in | Wt 223.4 lb

## 2021-08-24 DIAGNOSIS — Z6831 Body mass index (BMI) 31.0-31.9, adult: Secondary | ICD-10-CM

## 2021-08-24 DIAGNOSIS — E1165 Type 2 diabetes mellitus with hyperglycemia: Secondary | ICD-10-CM | POA: Diagnosis not present

## 2021-08-24 DIAGNOSIS — E1159 Type 2 diabetes mellitus with other circulatory complications: Secondary | ICD-10-CM | POA: Diagnosis not present

## 2021-08-24 DIAGNOSIS — Z7689 Persons encountering health services in other specified circumstances: Secondary | ICD-10-CM | POA: Diagnosis not present

## 2021-08-24 DIAGNOSIS — R197 Diarrhea, unspecified: Secondary | ICD-10-CM | POA: Diagnosis not present

## 2021-08-24 DIAGNOSIS — I10 Essential (primary) hypertension: Secondary | ICD-10-CM

## 2021-08-24 MED ORDER — METRONIDAZOLE 500 MG PO TABS: 500.0000 mg | ORAL_TABLET | Freq: Two times a day (BID) | ORAL | 0 refills | Status: DC

## 2021-08-24 NOTE — Progress Notes (Signed)
New Patient Office Visit  Subjective:  Patient ID: Robert Brewer, male    DOB: 16-Jul-1946  Age: 76 y.o. MRN: [id number]  CC:  Chief Complaint  Patient presents with   New Patient (Initial Visit)    HPI Robert Brewer presents to establish new primary care provider. The patient states that he started having diarrhea back in September. Saw his PCP for this in October. Was told to take imodium. He had already completed a trial of pepto bismol. After seeing previous PCP, he took several doses of imodium, then kaopectate, which neither of these helped.  States that he gets an urge to bowel movement every one to four hours. The stool is watery. He states that sometimes he does notice a light red blood in the stool. He thinks this may be from irritation. He states that sometimes, but not often, he gets pain and cramping in the abdomen. Usually this is related to gas. He has had a few episodes of incontinence of stool. He states that appetite has been normal. He has not lost any weight. He denies any fever.   Past Medical History:  Diagnosis Date   Diabetes ([place])    Stroke (cerebrum) ([place])     History reviewed. No pertinent surgical history.  Family History  Problem Relation Age of Onset   Other Mother        healthy, lived to her 39's   Prostate cancer Father        lived to his 63's   Congestive Heart Failure Father    Valvular heart disease Sister    Prostate cancer Brother     Social History   Socioeconomic History   Marital status: Married    Spouse name: Not on file   Number of children: Not on file   Years of education: Not on file   Highest education level: Not on file  Occupational History   Not on file  Tobacco Use   Smoking status: Never   Smokeless tobacco: Never  Vaping Use   Vaping Use: Never used  Substance and Sexual Activity   Alcohol use: Never   Drug use: Never   Sexual activity: Not Currently  Other Topics Concern   Not on file  Social History  Narrative   Not on file   Social Determinants of Health   Financial Resource Strain: Not on file  Food Insecurity: Not on file  Transportation Needs: Not on file  Physical Activity: Not on file  Stress: Not on file  Social Connections: Not on file  Intimate Partner Violence: Not on file    ROS Review of Systems  Constitutional:  Positive for appetite change and fatigue. Negative for activity change, chills and fever.  HENT:  Negative for congestion, postnasal drip, rhinorrhea, sinus pressure, sinus pain, sneezing and sore throat.   Eyes: Negative.   Respiratory:  Negative for cough, shortness of breath and wheezing.   Cardiovascular:  Negative for chest pain and palpitations.  Gastrointestinal:  Positive for diarrhea and nausea. Negative for constipation and vomiting.  Endocrine: Negative for cold intolerance, heat intolerance, polydipsia and polyuria.  Genitourinary:  Negative for dysuria, frequency and urgency.  Musculoskeletal:  Negative for back pain and myalgias.  Skin:  Negative for rash.  Allergic/Immunologic: Negative for environmental allergies.  Neurological:  Negative for dizziness, weakness and headaches.  Psychiatric/Behavioral:  The patient is not nervous/anxious.    Objective:   Today's Vitals   08/24/21 1530  BP: (!) 165/74  Pulse: 60  Temp: 98.7 F (37.1 C)  SpO2: 97%  Weight: 223 lb 6.4 oz (101.3 kg)  Height: 5' 10.87" (1.8 m)   Body mass index is 31.28 kg/m.   Physical Exam Vitals and nursing note reviewed.  Constitutional:      Appearance: Normal appearance. He is well-developed.  HENT:     Head: Normocephalic and atraumatic.     Nose: Nose normal.     Mouth/Throat:     Mouth: Mucous membranes are moist.     Pharynx: Oropharynx is clear.  Eyes:     Extraocular Movements: Extraocular movements intact.     Conjunctiva/sclera: Conjunctivae normal.     Pupils: Pupils are equal, round, and reactive to light.  Neck:     Vascular: No carotid  bruit.  Cardiovascular:     Rate and Rhythm: Normal rate and regular rhythm.     Pulses: Normal pulses.     Heart sounds: Normal heart sounds.  Pulmonary:     Effort: Pulmonary effort is normal.     Breath sounds: Normal breath sounds.  Abdominal:     General: Bowel sounds are normal. There is no distension.     Palpations: Abdomen is soft. There is no mass.     Tenderness: There is no abdominal tenderness. There is no guarding or rebound.     Hernia: No hernia is present.  Musculoskeletal:        General: Normal range of motion.     Cervical back: Normal range of motion and neck supple.  Lymphadenopathy:     Cervical: No cervical adenopathy.  Skin:    General: Skin is warm and dry.     Capillary Refill: Capillary refill takes less than 2 seconds.  Neurological:     General: No focal deficit present.     Mental Status: He is alert and oriented to person, place, and time.  Psychiatric:        Mood and Affect: Mood normal.        Behavior: Behavior normal.        Thought Content: Thought content normal.        Judgment: Judgment normal.    Assessment & Plan:  1. Encounter to establish care Appointment today to establish new primary care provider.  Will get medical records from previous PCP to review and update patient chart.   2. Diarrhea of presumed infectious origin Start Flagyl 5 mg twice daily for next 10 days.  Check labs for evidence of dehydration, CBC, amylase, lipase, and TSH.  Stool samples ordered to evaluate for abnormalities.  Will refer to GI for further evaluation. - metroNIDAZOLE (FLAGYL) 500 MG tablet; Take 1 tablet (500 mg total) by mouth 2 (two) times daily.  Dispense: 20 tablet; Refill: 0 - CBC; Future - Comp Met (CMET); Future - TSH; Future - Amylase; Future - Lipase; Future - Amylase - TSH - Comp Met (CMET) - CBC - Lipase - C difficile Toxins A+B W/Rflx - Stool Culture  3. Body mass index (BMI) of 31.0-31.9 in adult Encourage patient to limit  calorie intake to 2000 cal/day or less.  He should consume a low cholesterol, low-fat diet.     4. Essential hypertension The DASH diet is an eating plan that can help lower your blood pressure. DASH stands for Dietary Approaches to Stop Hypertension. Hypertension is high blood pressure. The DASH diet focuses on eating foods that are high in calcium, potassium, and magnesium. These nutrients can lower blood pressure.  The foods that are highest in these nutrients are fruits, vegetables, low-fat dairy products, nuts, seeds, and legumes. But taking calcium, potassium, and magnesium supplements instead of eating foods that are high in those nutrients does not have the same effect. The DASH diet also includes whole grains, fish, and poultry. The DASH diet is one of several lifestyle changes your doctor may recommend to lower your high blood pressure. Your doctor may also want you to decrease the amount of sodium in your diet. Lowering sodium while following the DASH diet can lower blood pressure even further than just the DASH diet alone. Follow-up care is a key part of your treatment and safety. Be sure to make and go to all appointments, and call your doctor if you are having problems. It's also a good idea to know your test results and keep a list of the medicines you take. How can you care for yourself at home? Following the DASH diet  Eat 4 to 5 servings of fruit each day. A serving is 1 medium-sized piece of fruit,  cup chopped or canned fruit, 1/4 cup dried fruit, or 4 ounces ( cup) of fruit juice. Choose fruit more often than fruit juice.  Eat 4 to 5 servings of vegetables each day. A serving is 1 cup of lettuce or raw leafy vegetables,  cup of chopped or cooked vegetables, or 4 ounces ( cup) of vegetable juice. Choose vegetables more often than vegetable juice.  Get 2 to 3 servings of low-fat and fat-free dairy each day. A serving is 8 ounces of milk, 1 cup of yogurt, or 1  ounces of  cheese.  Eat 6 to 8 servings of grains each day. A serving is 1 slice of bread, 1 ounce of dry cereal, or  cup of cooked rice, pasta, or cooked cereal. Try to choose whole-grain products as much as possible.  Limit lean meat, poultry, and fish to 2 servings each day. A serving is 3 ounces, about the size of a deck of cards.  Eat 4 to 5 servings of nuts, seeds, and legumes (cooked dried beans, lentils, and split peas) each week. A serving is 1/3 cup of nuts, 2 tablespoons of seeds, or  cup of cooked beans or peas.  Limit fats and oils to 2 to 3 servings each day. A serving is 1 teaspoon of vegetable oil or 2 tablespoons of salad dressing.  Limit sweets and added sugars to 5 servings or less a week. A serving is 1 tablespoon jelly or jam,  cup sorbet, or 1 cup of lemonade.  Eat less than 2,300 milligrams (mg) of sodium a day. If you limit your sodium to 1,500 mg a day, you can lower your blood pressure even more. Tips for success  Start small. Do not try to make dramatic changes to your diet all at once. You might feel that you are missing out on your favorite foods and then be more likely to not follow the plan. Make small changes, and stick with them. Once those changes become habit, add a few more changes. Continue blood pressure medication as prescribed.  We will reassess at next visit and adjust medications as indicated.  5. Poorly controlled type 2 diabetes mellitus with circulatory disorder ([place]) Continue diabetic medication as prescribed.  Sees endocrinology.   Problem List Items Addressed This Visit       Cardiovascular and Mediastinum   Essential hypertension     Digestive   Diarrhea of presumed infectious origin  Relevant Medications   metroNIDAZOLE (FLAGYL) 500 MG tablet   Other Relevant Orders   CBC (Completed)   Comp Met (CMET) (Completed)   TSH (Completed)   Amylase (Completed)   Lipase (Completed)   C difficile Toxins A+B W/Rflx (Completed)   Stool Culture  (Completed)     Endocrine   Poorly controlled type 2 diabetes mellitus with circulatory disorder ([facility])     Other   Body mass index (BMI) of 31.0-31.9 in adult   Other Visit Diagnoses     Encounter to establish care    -  Primary       Outpatient Encounter Medications as of 08/24/2021  Medication Sig   aspirin EC 81 MG EC tablet Take 1 tablet (81 mg total) by mouth daily.   BD PEN NEEDLE NANO 2ND GEN 32G X 4 MM MISC Use to check blood sugar 1-2 times daily   Lancets Misc. (ACCU-CHEK SOFTCLIX LANCET DEV) KIT Use to check 1-2 times daily   LANTUS SOLOSTAR 100 UNIT/ML Solostar Pen INJECT 25 UNITS INTO THE SKIN DAILY.   metroNIDAZOLE (FLAGYL) 500 MG tablet Take 1 tablet (500 mg total) by mouth 2 (two) times daily.   Multiple Vitamin (MULTIVITAMIN) tablet Take 1 tablet by mouth daily.   TRUEplus Lancets 33G MISC CHECK BLOOD SUGAR 1 TO 2 TIMES A DAY DX CODE E11.65   pravastatin (PRAVACHOL) 20 MG tablet Take 1 tablet (20 mg total) by mouth 2 (two) times a week.   No facility-administered encounter medications on file as of 08/24/2021.    Follow-up: Return in about 3 weeks (around 09/14/2021) for diarhea - review labs - may need to be referred t oGI.   Ronnell Freshwater, NP

## 2021-08-25 LAB — COMPREHENSIVE METABOLIC PANEL
ALT: 17 IU/L (ref 0–44)
AST: 24 IU/L (ref 0–40)
Albumin/Globulin Ratio: 2 (ref 1.2–2.2)
Albumin: 4.7 g/dL (ref 3.7–4.7)
Alkaline Phosphatase: 89 IU/L (ref 44–121)
BUN/Creatinine Ratio: 18 (ref 10–24)
BUN: 13 mg/dL (ref 8–27)
Bilirubin Total: 0.8 mg/dL (ref 0.0–1.2)
CO2: 20 mmol/L (ref 20–29)
Calcium: 9.5 mg/dL (ref 8.6–10.2)
Chloride: 102 mmol/L (ref 96–106)
Creatinine, Ser: 0.73 mg/dL — ABNORMAL LOW (ref 0.76–1.27)
Globulin, Total: 2.3 g/dL (ref 1.5–4.5)
Glucose: 99 mg/dL (ref 70–99)
Potassium: 5 mmol/L (ref 3.5–5.2)
Sodium: 139 mmol/L (ref 134–144)
Total Protein: 7 g/dL (ref 6.0–8.5)
eGFR: 96 mL/min/{1.73_m2} (ref >59)

## 2021-08-25 LAB — CBC
Hematocrit: 43.2 % (ref 37.5–51.0)
Hemoglobin: 14.5 g/dL (ref 13.0–17.7)
MCH: 28 pg (ref 26.6–33.0)
MCHC: 33.6 g/dL (ref 31.5–35.7)
MCV: 84 fL (ref 79–97)
Platelets: 262 10*3/uL (ref 150–450)
RBC: 5.17 x10E6/uL (ref 4.14–5.80)
RDW: 13.4 % (ref 11.6–15.4)
WBC: 8.9 10*3/uL (ref 3.4–10.8)

## 2021-08-25 LAB — LIPASE: Lipase: 25 U/L (ref 13–78)

## 2021-08-25 LAB — AMYLASE: Amylase: 56 U/L (ref 31–110)

## 2021-08-25 LAB — TSH: TSH: 1.26 u[IU]/mL (ref 0.450–4.500)

## 2021-08-25 NOTE — Progress Notes (Signed)
Please let the patient know that blood work we did looks good. No evidence of dehydrations. Kidneys and liver are both functioning well. Thyroid is normal. Blood count is good. Pancreatic enzymes are also good. Will wait and see what stool studies show.  Thanks so much.   -HB

## 2021-08-29 DIAGNOSIS — Z6831 Body mass index (BMI) 31.0-31.9, adult: Secondary | ICD-10-CM | POA: Insufficient documentation

## 2021-08-29 DIAGNOSIS — Z6830 Body mass index (BMI) 30.0-30.9, adult: Secondary | ICD-10-CM

## 2021-08-29 DIAGNOSIS — R197 Diarrhea, unspecified: Secondary | ICD-10-CM | POA: Insufficient documentation

## 2021-08-29 HISTORY — DX: Diarrhea, unspecified: R19.7

## 2021-08-29 HISTORY — DX: Body mass index (BMI) 30.0-30.9, adult: Z68.30

## 2021-08-30 LAB — C DIFFICILE, CYTOTOXIN B

## 2021-08-30 LAB — STOOL CULTURE: E coli, Shiga toxin Assay: NEGATIVE

## 2021-08-30 LAB — C DIFFICILE TOXINS A+B W/RFLX: C difficile Toxins A+B, EIA: NEGATIVE

## 2021-08-31 ENCOUNTER — Ambulatory Visit: Payer: Medicare HMO | Admitting: Family Medicine

## 2021-08-31 ENCOUNTER — Other Ambulatory Visit: Payer: Self-pay | Admitting: Nurse Practitioner

## 2021-08-31 DIAGNOSIS — R197 Diarrhea, unspecified: Secondary | ICD-10-CM

## 2021-08-31 MED ORDER — METRONIDAZOLE 500 MG PO TABS: 500.0000 mg | ORAL_TABLET | Freq: Two times a day (BID) | ORAL | 0 refills | Status: DC

## 2021-08-31 NOTE — Progress Notes (Signed)
Please let the patient know that stool studies were negative. Please ask how he is doing on antibiotics?  Thanks so much.   -HB

## 2021-08-31 NOTE — Progress Notes (Signed)
I renewed the prescription for flagyl bid for 10 additional days .

## 2021-09-14 ENCOUNTER — Encounter: Payer: Self-pay | Admitting: Nurse Practitioner

## 2021-09-14 ENCOUNTER — Other Ambulatory Visit: Payer: Self-pay

## 2021-09-14 ENCOUNTER — Ambulatory Visit (INDEPENDENT_AMBULATORY_CARE_PROVIDER_SITE_OTHER): Payer: Medicare HMO | Admitting: Nurse Practitioner

## 2021-09-14 VITALS — BP 124/68 | HR 59 | Temp 98.0°F | Ht 70.87 in | Wt 222.1 lb

## 2021-09-14 DIAGNOSIS — Z6831 Body mass index (BMI) 31.0-31.9, adult: Secondary | ICD-10-CM

## 2021-09-14 DIAGNOSIS — E1165 Type 2 diabetes mellitus with hyperglycemia: Secondary | ICD-10-CM

## 2021-09-14 DIAGNOSIS — E1159 Type 2 diabetes mellitus with other circulatory complications: Secondary | ICD-10-CM

## 2021-09-14 DIAGNOSIS — M25551 Pain in right hip: Secondary | ICD-10-CM | POA: Diagnosis not present

## 2021-09-14 DIAGNOSIS — R197 Diarrhea, unspecified: Secondary | ICD-10-CM | POA: Diagnosis not present

## 2021-09-14 MED ORDER — GABAPENTIN 100 MG PO CAPS: ORAL_CAPSULE | ORAL | 3 refills | Status: DC

## 2021-09-14 MED ORDER — CIPROFLOXACIN HCL 250 MG PO TABS: 250.0000 mg | ORAL_TABLET | Freq: Two times a day (BID) | ORAL | 0 refills | Status: DC

## 2021-09-14 NOTE — Progress Notes (Signed)
Established patient visit   Patient: Robert Brewer   DOB: July 16, 1946   76 y.o. Male  MRN: [id number] Visit Date: 09/14/2021   Chief Complaint  Patient presents with   Follow-up   Diarrhea   Subjective    The patient finished with Flagyl yesterday. He states that episodes of diarrhea have improved by about 75%. Did have a negative cologuard in December 2022. Had labs done due to diarrhea. Stool studies were negative. Labs, including pancreatic enzymes and liver functions were all normal.  Right hip pain. Describes pain as sharp and sitting right at the top of iliac crest. Will sometimes radiate to his knee. Then sometimes will radiate from knee to right foot. Radiating pain is described as an ache. Sitting and laying down make pain worse. Standing makes it better .  Diarrhea  This is a chronic problem. The current episode started more than 1 month ago. The problem occurs 2 to 4 times per day (was happening p to every hour whe nhe first saw provider here.). The problem has been rapidly worsening. The stool consistency is described as Watery. The patient states that diarrhea awakens (maybe once or twice per night.) him from sleep. Associated symptoms include arthralgias, bloating and myalgias. Pertinent negatives include no abdominal pain, chills, coughing, fever, headaches, increased  flatus, sweats, vomiting or weight loss. Nothing aggravates the symptoms. There are no known risk factors. He has tried bismuth subsalicylate, anti-motility drug and increased fluids (has had round of flagyl, antibiotics which has helped) for the symptoms. The treatment provided moderate relief. There is no history of bowel resection, inflammatory bowel disease, irritable bowel syndrome, malabsorption, a recent abdominal surgery or short gut syndrome.      Medications: Outpatient Medications Prior to Visit  Medication Sig   aspirin EC 81 MG EC tablet Take 1 tablet (81 mg total) by mouth daily.   BD PEN NEEDLE  NANO 2ND GEN 32G X 4 MM MISC Use to check blood sugar 1-2 times daily   Lancets Misc. (ACCU-CHEK SOFTCLIX LANCET DEV) KIT Use to check 1-2 times daily   LANTUS SOLOSTAR 100 UNIT/ML Solostar Pen INJECT 25 UNITS INTO THE SKIN DAILY.   Multiple Vitamin (MULTIVITAMIN) tablet Take 1 tablet by mouth daily.   TRUEplus Lancets 33G MISC CHECK BLOOD SUGAR 1 TO 2 TIMES A DAY DX CODE E11.65   pravastatin (PRAVACHOL) 20 MG tablet Take 1 tablet (20 mg total) by mouth 2 (two) times a week.   [DISCONTINUED] metroNIDAZOLE (FLAGYL) 500 MG tablet Take 1 tablet (500 mg total) by mouth 2 (two) times daily.   No facility-administered medications prior to visit.    Review of Systems  Constitutional:  Positive for fatigue. Negative for activity change, chills, fever and weight loss.  HENT:  Negative for congestion, postnasal drip, rhinorrhea, sinus pressure, sinus pain, sneezing and sore throat.   Eyes: Negative.   Respiratory:  Negative for cough, shortness of breath and wheezing.   Cardiovascular:  Negative for chest pain and palpitations.  Gastrointestinal:  Positive for bloating and diarrhea. Negative for abdominal pain, constipation, flatus, nausea and vomiting.       The patient reports improved diarrhea since his most recent visit.  Endocrine: Negative for cold intolerance, heat intolerance, polydipsia and polyuria.  Genitourinary:  Negative for dysuria, frequency and urgency.  Musculoskeletal:  Positive for arthralgias and myalgias. Negative for back pain.       Right hip pain/tenderness at the iliac crest. Describes the pain as sharp and stabbing.  Skin:  Negative for rash.  Allergic/Immunologic: Negative for environmental allergies.  Neurological:  Negative for dizziness, weakness and headaches.  Psychiatric/Behavioral:  The patient is not nervous/anxious.    Last CBC Lab Results  Component Value Date   WBC 8.9 08/24/2021   HGB 14.5 08/24/2021   HCT 43.2 08/24/2021   MCV 84 08/24/2021   MCH  28.0 08/24/2021   RDW 13.4 08/24/2021   PLT 262 27/12/2374   Last metabolic panel Lab Results  Component Value Date   GLUCOSE 99 08/24/2021   NA 139 08/24/2021   K 5.0 08/24/2021   CL 102 08/24/2021   CO2 20 08/24/2021   BUN 13 08/24/2021   CREATININE 0.73 (L) 08/24/2021   EGFR 96 08/24/2021   CALCIUM 9.5 08/24/2021   PROT 7.0 08/24/2021   ALBUMIN 4.7 08/24/2021   LABGLOB 2.3 08/24/2021   AGRATIO 2.0 08/24/2021   BILITOT 0.8 08/24/2021   ALKPHOS 89 08/24/2021   AST 24 08/24/2021   ALT 17 08/24/2021   ANIONGAP 11 12/25/2018       Objective     Today's Vitals   09/14/21 1512  BP: 124/68  Pulse: (!) 59  Temp: 98 F (36.7 C)  SpO2: 95%  Weight: 222 lb 1.9 oz (100.8 kg)  Height: 5' 10.87" (1.8 m)   Body mass index is 31.09 kg/m.    BP Readings from Last 3 Encounters:  09/14/21 124/68  08/24/21 (!) 165/74  06/15/21 128/78    Wt Readings from Last 3 Encounters:  09/14/21 222 lb 1.9 oz (100.8 kg)  08/24/21 223 lb 6.4 oz (101.3 kg)  06/15/21 223 lb 3.2 oz (101.2 kg)    Physical Exam Vitals and nursing note reviewed.  Constitutional:      Appearance: Normal appearance. He is well-developed.  HENT:     Head: Normocephalic and atraumatic.  Eyes:     Pupils: Pupils are equal, round, and reactive to light.  Neck:     Vascular: No carotid bruit.  Cardiovascular:     Rate and Rhythm: Normal rate and regular rhythm.     Pulses: Normal pulses.     Heart sounds: Normal heart sounds.  Pulmonary:     Effort: Pulmonary effort is normal.     Breath sounds: Normal breath sounds.  Abdominal:     General: Bowel sounds are normal. There is no distension.     Palpations: Abdomen is soft. There is no mass.     Tenderness: There is no abdominal tenderness. There is no guarding or rebound.     Hernia: No hernia is present.  Musculoskeletal:        General: Normal range of motion.     Cervical back: Normal range of motion and neck supple.  Lymphadenopathy:      Cervical: No cervical adenopathy.  Skin:    General: Skin is warm and dry.     Capillary Refill: Capillary refill takes less than 2 seconds.  Neurological:     General: No focal deficit present.     Mental Status: He is alert and oriented to person, place, and time.  Psychiatric:        Mood and Affect: Mood normal.        Behavior: Behavior normal.        Thought Content: Thought content normal.        Judgment: Judgment normal.      Assessment & Plan    1. Diarrhea of presumed infectious origin Patient reports improvement without  resolution.  Stool studies were negative.  Lab results were normal.  We will do a round of Cipro 250 mg twice daily for next 10 days.  If diarrhea has not resolved, will consider referral to GI for further evaluation. - ciprofloxacin (CIPRO) 250 MG tablet; Take 1 tablet (250 mg total) by mouth 2 (two) times daily.  Dispense: 20 tablet; Refill: 0  2. Right hip pain Patient describing pain as sharp and stabbing.  Does sound like nerve related pain.  Trial gabapentin 100 mg in the evening.  May increase this to 2 capsules in the evening after 2 or 3 days if needed and as tolerated. - gabapentin (NEURONTIN) 100 MG capsule; Take 1 capsule po QHS prn nerve pain. May increase to 2 capsules po QHS as needed and as tolerated.  Dispense: 90 capsule; Refill: 3  3. Body mass index (BMI) of 31.0-31.9 in adult Encourage patient to limit calorie intake to 2000 cal/day or less.  He should consume a low cholesterol, low-fat diet.    4. Poorly controlled type 2 diabetes mellitus with circulatory disorder [facility]) Patient should continue regular visits with endocrinology as scheduled.   Problem List Items Addressed This Visit       Digestive   Diarrhea of presumed infectious origin - Primary   Relevant Medications   ciprofloxacin (CIPRO) 250 MG tablet     Endocrine   Poorly controlled type 2 diabetes mellitus with circulatory disorder (HCC)     Other   Body mass index  (BMI) of 31.0-31.9 in adult   Right hip pain   Relevant Medications   gabapentin (NEURONTIN) 100 MG capsule     Return in about 2 weeks (around 09/28/2021) for diarrhea .         Ronnell Freshwater, NP  [facility] [phone] (phone) [phone] (fax)  [place]

## 2021-09-19 DIAGNOSIS — M25551 Pain in right hip: Secondary | ICD-10-CM | POA: Insufficient documentation

## 2021-09-19 NOTE — Patient Instructions (Signed)
Fat and Cholesterol Restricted Eating Plan Getting too much fat and cholesterol in your diet may cause health problems. Choosing the right foods helps keep your fat and cholesterol at normal levels. This can keep you from getting certain diseases. Your doctor may recommend an eating plan that includes: Total fat: ______% or less of total calories a day. This is ______g of fat a day. Saturated fat: ______% or less of total calories a day. This is ______g of saturated fat a day. Cholesterol: less than _________mg a day. Fiber: ______g a day. What are tips for following this plan? General tips Work with your doctor to lose weight if you need to. Avoid: Foods with added sugar. Fried foods. Foods with trans fat or partially hydrogenated oils. This includes some margarines and baked goods. If you drink alcohol: Limit how much you have to: 0-1 drink a day for women who are not pregnant. 0-2 drinks a day for men. Know how much alcohol is in a drink. In the [place] one drink equals one 12 oz bottle of beer (355 mL), one 5 oz glass of wine (148 mL), or one 1 oz glass of hard liquor (44 mL). Reading food labels Check food labels for: Trans fats. Partially hydrogenated oils. Saturated fat (g) in each serving. Cholesterol (mg) in each serving. Fiber (g) in each serving. Choose foods with healthy fats, such as: Monounsaturated fats and polyunsaturated fats. These include olive and canola oil, flaxseeds, walnuts, almonds, and seeds. Omega-3 fats. These are found in certain fish, flaxseed oil, and ground flaxseeds. Choose grain products that have whole grains. Look for the word "whole" as the first word in the ingredient list. Cooking Cook foods using low-fat methods. These include baking, boiling, grilling, and broiling. Eat more home-cooked foods. Eat at restaurants and buffets less often. Eat less fast food. Avoid cooking using saturated fats, such as butter, cream, palm oil, palm kernel oil, and  coconut oil. Meal planning  At meals, divide your plate into four equal parts: Fill one-half of your plate with vegetables, green salads, and fruit. Fill one-fourth of your plate with whole grains. Fill one-fourth of your plate with low-fat (lean) protein foods. Eat fish that is high in omega-3 fats at least two times a week. This includes mackerel, tuna, sardines, and salmon. Eat foods that are high in fiber, such as whole grains, beans, apples, pears, berries, broccoli, carrots, peas, and barley. What foods should I eat? Fruits All fresh, canned (in natural juice), or frozen fruits. Vegetables Fresh or frozen vegetables (raw, steamed, roasted, or grilled). Green salads. Grains Whole grains, such as whole wheat or whole grain breads, crackers, cereals, and pasta. Unsweetened oatmeal, bulgur, barley, quinoa, or brown rice. Corn or whole wheat flour tortillas. Meats and other protein foods Ground beef (85% or leaner), grass-fed beef, or beef trimmed of fat. Skinless chicken or turkey. Ground chicken or turkey. Pork trimmed of fat. All fish and seafood. Egg whites. Dried beans, peas, or lentils. Unsalted nuts or seeds. Unsalted canned beans. Nut butters without added sugar or oil. Dairy Low-fat or nonfat dairy products, such as skim or 1% milk, 2% or reduced-fat cheeses, low-fat and fat-free ricotta or cottage cheese, or plain low-fat and nonfat yogurt. Fats and oils Tub margarine without trans fats. Light or reduced-fat mayonnaise and salad dressings. Avocado. Olive, canola, sesame, or safflower oils. The items listed above may not be a complete list of foods and beverages you can eat. Contact a dietitian for more information. What foods   should I avoid? Fruits Canned fruit in heavy syrup. Fruit in cream or butter sauce. Fried fruit. Vegetables Vegetables cooked in cheese, cream, or butter sauce. Fried vegetables. Grains White bread. White pasta. White rice. Cornbread. Bagels, pastries,  and croissants. Crackers and snack foods that contain trans fat and hydrogenated oils. Meats and other protein foods Fatty cuts of meat. Ribs, chicken wings, bacon, sausage, bologna, salami, chitterlings, fatback, hot dogs, bratwurst, and packaged lunch meats. Liver and organ meats. Whole eggs and egg yolks. Chicken and turkey with skin. Fried meat. Dairy Whole or 2% milk, cream, half-and-half, and cream cheese. Whole milk cheeses. Whole-fat or sweetened yogurt. Full-fat cheeses. Nondairy creamers and whipped toppings. Processed cheese, cheese spreads, and cheese curds. Fats and oils Butter, stick margarine, lard, shortening, ghee, or bacon fat. Coconut, palm kernel, and palm oils. Beverages Alcohol. Sugar-sweetened drinks such as sodas, lemonade, and fruit drinks. Sweets and desserts Corn syrup, sugars, honey, and molasses. Candy. Jam and jelly. Syrup. Sweetened cereals. Cookies, pies, cakes, donuts, muffins, and ice cream. The items listed above may not be a complete list of foods and beverages you should avoid. Contact a dietitian for more information. Summary Choosing the right foods helps keep your fat and cholesterol at normal levels. This can keep you from getting certain diseases. At meals, fill one-half of your plate with vegetables, green salads, and fruits. Eat high fiber foods, like whole grains, beans, apples, pears, berries, carrots, peas, and barley. Limit added sugar, saturated fats, alcohol, and fried foods. This information is not intended to replace advice given to you by your health care provider. Make sure you discuss any questions you have with your health care provider. Document Revised: 11/20/2020 Document Reviewed: 11/20/2020 Elsevier Patient Education  2022 [employer] Inc[employer]  

## 2021-09-29 ENCOUNTER — Other Ambulatory Visit: Payer: Self-pay

## 2021-09-29 ENCOUNTER — Encounter: Payer: Self-pay | Admitting: Nurse Practitioner

## 2021-09-29 ENCOUNTER — Ambulatory Visit (INDEPENDENT_AMBULATORY_CARE_PROVIDER_SITE_OTHER): Payer: Medicare HMO | Admitting: Nurse Practitioner

## 2021-09-29 VITALS — BP 155/74 | HR 60 | Temp 98.2°F | Ht 70.87 in | Wt 220.1 lb

## 2021-09-29 DIAGNOSIS — I639 Cerebral infarction, unspecified: Secondary | ICD-10-CM | POA: Diagnosis not present

## 2021-09-29 DIAGNOSIS — R197 Diarrhea, unspecified: Secondary | ICD-10-CM

## 2021-09-29 DIAGNOSIS — I10 Essential (primary) hypertension: Secondary | ICD-10-CM | POA: Diagnosis not present

## 2021-09-29 DIAGNOSIS — E1165 Type 2 diabetes mellitus with hyperglycemia: Secondary | ICD-10-CM | POA: Diagnosis not present

## 2021-09-29 DIAGNOSIS — Z6830 Body mass index (BMI) 30.0-30.9, adult: Secondary | ICD-10-CM | POA: Diagnosis not present

## 2021-09-29 DIAGNOSIS — E1159 Type 2 diabetes mellitus with other circulatory complications: Secondary | ICD-10-CM | POA: Diagnosis not present

## 2021-09-29 DIAGNOSIS — G8191 Hemiplegia, unspecified affecting right dominant side: Secondary | ICD-10-CM | POA: Diagnosis not present

## 2021-09-29 MED ORDER — CIPROFLOXACIN HCL 500 MG PO TABS: 500.0000 mg | ORAL_TABLET | Freq: Two times a day (BID) | ORAL | 0 refills | Status: DC

## 2021-09-29 NOTE — Progress Notes (Unsigned)
Established patient visit   Patient: Robert Brewer   DOB: 1945-08-27   76 y.o. Male  MRN: [id number] Visit Date: 09/29/2021   Chief Complaint  Patient presents with   Follow-up   Subjective    HPI  The patient is here for follow up GI infection. Was started with flagyl.    Medications: Outpatient Medications Prior to Visit  Medication Sig   aspirin EC 81 MG EC tablet Take 1 tablet (81 mg total) by mouth daily.   BD PEN NEEDLE NANO 2ND GEN 32G X 4 MM MISC Use to check blood sugar 1-2 times daily   ciprofloxacin (CIPRO) 250 MG tablet Take 1 tablet (250 mg total) by mouth 2 (two) times daily.   gabapentin (NEURONTIN) 100 MG capsule Take 1 capsule po QHS prn nerve pain. May increase to 2 capsules po QHS as needed and as tolerated.   Lancets Misc. (ACCU-CHEK SOFTCLIX LANCET DEV) KIT Use to check 1-2 times daily   LANTUS SOLOSTAR 100 UNIT/ML Solostar Pen INJECT 25 UNITS INTO THE SKIN DAILY.   Multiple Vitamin (MULTIVITAMIN) tablet Take 1 tablet by mouth daily.   TRUEplus Lancets 33G MISC CHECK BLOOD SUGAR 1 TO 2 TIMES A DAY DX CODE E11.65   pravastatin (PRAVACHOL) 20 MG tablet Take 1 tablet (20 mg total) by mouth 2 (two) times a week.   No facility-administered medications prior to visit.    Review of Systems  Last CBC Lab Results  Component Value Date   WBC 8.9 08/24/2021   HGB 14.5 08/24/2021   HCT 43.2 08/24/2021   MCV 84 08/24/2021   MCH 28.0 08/24/2021   RDW 13.4 08/24/2021   PLT 262 52/02/222   Last metabolic panel Lab Results  Component Value Date   GLUCOSE 99 08/24/2021   NA 139 08/24/2021   K 5.0 08/24/2021   CL 102 08/24/2021   CO2 20 08/24/2021   BUN 13 08/24/2021   CREATININE 0.73 (L) 08/24/2021   EGFR 96 08/24/2021   CALCIUM 9.5 08/24/2021   PROT 7.0 08/24/2021   ALBUMIN 4.7 08/24/2021   LABGLOB 2.3 08/24/2021   AGRATIO 2.0 08/24/2021   BILITOT 0.8 08/24/2021   ALKPHOS 89 08/24/2021   AST 24 08/24/2021   ALT 17 08/24/2021   ANIONGAP 11  12/25/2018   Last lipids Lab Results  Component Value Date   CHOL 183 08/13/2020   HDL 38.60 (L) 08/13/2020   LDLCALC 113 (H) 08/13/2020   TRIG 156.0 (H) 08/13/2020   CHOLHDL 5 08/13/2020   Last hemoglobin A1c Lab Results  Component Value Date   HGBA1C 6.8 (A) 06/15/2021   Last thyroid functions Lab Results  Component Value Date   TSH 1.260 08/24/2021        Objective     Today's Vitals   09/29/21 1523  BP: (!) 156/72  Pulse: 60  Temp: 98.2 F (36.8 C)  SpO2: 97%  Weight: 220 lb 1.9 oz (99.8 kg)  Height: 5' 10.87" (1.8 m)   Body mass index is 30.81 kg/m.   BP Readings from Last 3 Encounters:  09/29/21 (!) 156/72  09/14/21 124/68  08/24/21 (!) 165/74    Wt Readings from Last 3 Encounters:  09/29/21 220 lb 1.9 oz (99.8 kg)  09/14/21 222 lb 1.9 oz (100.8 kg)  08/24/21 223 lb 6.4 oz (101.3 kg)    Physical Exam  ***  No results found for any visits on 09/29/21.  Assessment & Plan     Problem List Items Addressed  This Visit   None    No follow-ups on file.         Ronnell Freshwater, NP  [facility] [phone] (phone) [phone] (fax)  [place]

## 2021-10-26 ENCOUNTER — Encounter: Payer: Self-pay | Admitting: Internal Medicine

## 2021-10-26 ENCOUNTER — Ambulatory Visit (INDEPENDENT_AMBULATORY_CARE_PROVIDER_SITE_OTHER): Payer: Medicare HMO | Admitting: Internal Medicine

## 2021-10-26 VITALS — BP 140/92 | HR 63 | Ht 70.87 in | Wt 220.6 lb

## 2021-10-26 DIAGNOSIS — E669 Obesity, unspecified: Secondary | ICD-10-CM

## 2021-10-26 DIAGNOSIS — E1165 Type 2 diabetes mellitus with hyperglycemia: Secondary | ICD-10-CM | POA: Diagnosis not present

## 2021-10-26 DIAGNOSIS — E1159 Type 2 diabetes mellitus with other circulatory complications: Secondary | ICD-10-CM | POA: Diagnosis not present

## 2021-10-26 DIAGNOSIS — E785 Hyperlipidemia, unspecified: Secondary | ICD-10-CM

## 2021-10-26 LAB — POCT GLYCOSYLATED HEMOGLOBIN (HGB A1C): Hemoglobin A1C: 7 % — AB (ref 4.0–5.6)

## 2021-10-26 MED ORDER — LANTUS SOLOSTAR 100 UNIT/ML ~~LOC~~ SOPN: PEN_INJECTOR | SUBCUTANEOUS | 11 refills | Status: DC

## 2021-10-26 NOTE — Patient Instructions (Addendum)
Please continue: ?- Lantus 25 units at night ? ?Please work on [employer].  ? ?Please return in 4 months with your sugar log.  ?

## 2021-10-26 NOTE — Progress Notes (Addendum)
Patient ID: Robert Brewer, male   DOB: [birth year], 76 y.o.   MRN: [id number]  ? ?This visit occurred during the SARS-CoV-2 public health emergency.  Safety protocols were in place, including screening questions prior to the visit, additional usage of staff PPE, and extensive cleaning of exam room while observing appropriate contact time as indicated for disinfecting solutions.  ? ?HPI: ?Robert Brewer is a 76 y.o.-year-old male, initially referred by Cathlyn Parsons, PA-C, returning for follow-up DM2, dx in 2009, insulin-dependent since 12/2018, uncontrolled, with complications (stroke 58/5277).  His wife, who is also my patient, accompanies patient today and offers more information about his medications, blood sugars, and diet.  Last visit 4 months ago. ?PCP: Robert Freshwater, NP, [place] ? ?Interim history: ?No increased urination, blurry vision, nausea, chest pain.  ?At last visit he was having chronic diarrhea.  Upon questioning, he was drinking 1 gallon of milk a week.  We stopped this. He still had diarrhea >> had ABx >> now better. ?He continues to have weakness on the right side of his body and disequilibrium.  He is walking with a cane. ? ?Reviewed HbA1c levels ?Lab Results  ?Component Value Date  ? HGBA1C 6.8 (A) 06/15/2021  ? HGBA1C 6.8 (A) 02/05/2021  ? HGBA1C 6.8 (A) 08/13/2020  ? HGBA1C 6.8 (A) 04/16/2020  ? HGBA1C 6.7 (A) 12/09/2019  ? HGBA1C 6.8 (A) 09/09/2019  ? HGBA1C 6.2 (A) 06/10/2019  ? HGBA1C 6.5 (A) 03/19/2019  ? HGBA1C 13.3 (H) 12/17/2018  ? ?Pt is on a regimen of: ?- Lantus 25 units in am-started 12/2018, after his stroke>> moved at night ?He was on JanuMet in the past, but not in last 3 years. ? ?Pt checks his sugars once a day: ?- am: 95-162, 170 >> 93-138, 172 >> 98-143, 162 >> 89, 97-156, 167 ?- 2h after b'fast: n/c >> 133 >> n/c ?- before lunch: 77, 99-150, 168 >> 88, 95-152, 162, 190 >> 98-148, 157 ?- 2h after lunch: n/c ?- before dinner: n/c >> 97-114, 181 >> n/c >> 93 >> n/c  ?-  2h after dinner: n/c >> 163 >> n/c  ?- bedtime: n/c >> 132, 149 >> n/c >> 253 >> 198 >> n/c >> 182 >> n/c ?- nighttime: n/c ?Lowest sugar was 66 in the hospital >>... 93 >> 77 >> 88>> 89; he has hypoglycemia awareness in the 70s.  ?Highest sugar was 233 >> .Marland Kitchen. 177 >> 190 >>  167. ? ?Glucometer: ReliOn ? ?Pt's meals are: ?- Breakfast: whole wheat cereal with milk, sometimes egg + toast + bacon; oatmeal or grits ?- Lunch: none ?- Dinner: steak + potatoes + veggies; pizza; salad + mayo ?- Snacks: PB sandwich + milk ?He was drinking a lot of regular sodas and milk before the stroke, also eating a lot of icecream.  He stopped these after his stroke.  Since last visit, he also stopped milk. ? ?No CKD, last BUN/creatinine:  ?Lab Results  ?Component Value Date  ? BUN 13 08/24/2021  ? BUN 17 08/13/2020  ? CREATININE 0.73 (L) 08/24/2021  ? CREATININE 0.83 08/13/2020  ?Not on ACE inhibitor/ARB. ? ?+ HL; last set of lipids: ?Lab Results  ?Component Value Date  ? CHOL 183 08/13/2020  ? HDL 38.60 (L) 08/13/2020  ? LDLCALC 113 (H) 08/13/2020  ? TRIG 156.0 (H) 08/13/2020  ? CHOLHDL 5 08/13/2020  ?05/24/2019: 176/56/50/? ?02/15/2019: LDL 60 ?He was started on a statin after history of but he apparently stopped after LDL  cholesterol decreased to 60.  He refused to restart it afterwards.  She also refused to start Zetia, which I suggested at last visit. Dr. Bettina Gavia recommended Pravastatin 20 mg 2x a week. He started this. ? ?- last eye exam was in 08/2020: No DR. Coming up this month. ? ?-No numbness and tingling in his feet. ? ?Pt has FH of DM in brother. ? 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  ? ?TSH from 01/24/2019: 0.577, normal. ? ?ROS: ?+ See HPI ? ?Past Medical History:  ?Diagnosis Date  ? Diabetes ([place])   ? Stroke (cerebrum) ([place])   ?  ?No past surgical history on file.  ? ?Social History  ? ?Socioeconomic History  ?  Marital status: Married  ?  Spouse name: Not on file  ? Number of children: 0  ? Years of education: Not on file  ? Highest education level: Not on file  ?Occupational History  ?  Retired  ?Social Needs  ? Financial resource strain: Not on file  ? Food insecurity  ?  Worry: Not on file  ?  Inability: Not on file  ? Transportation needs  ?  Medical: Not on file  ?  Non-medical: Not on file  ?Tobacco Use  ? Smoking status: Never Smoker  ? Smokeless tobacco: Never Used  ?Substance and Sexual Activity  ? Alcohol use:  Beer  ?  Frequency:  Rarely  ? Drug use: No  ? ?Current Outpatient Medications on File Prior to Visit  ?Medication Sig Dispense Refill  ? aspirin EC 81 MG EC tablet Take 1 tablet (81 mg total) by mouth daily.    ? BD PEN NEEDLE NANO 2ND GEN 32G X 4 MM MISC Use to check blood sugar 1-2 times daily 200 each 12  ? ciprofloxacin (CIPRO) 500 MG tablet Take 1 tablet (500 mg total) by mouth 2 (two) times daily. 20 tablet 0  ? gabapentin (NEURONTIN) 100 MG capsule Take 1 capsule po QHS prn nerve pain. May increase to 2 capsules po QHS as needed and as tolerated. 90 capsule 3  ? Lancets Misc. (ACCU-CHEK SOFTCLIX LANCET DEV) KIT Use to check 1-2 times daily 1 kit 0  ? LANTUS SOLOSTAR 100 UNIT/ML Solostar Pen INJECT 25 UNITS INTO THE SKIN DAILY. 15 mL 11  ? Multiple Vitamin (MULTIVITAMIN) tablet Take 1 tablet by mouth daily.    ? pravastatin (PRAVACHOL) 20 MG tablet Take 1 tablet (20 mg total) by mouth 2 (two) times a week. 40 tablet 3  ? TRUEplus Lancets 33G MISC CHECK BLOOD SUGAR 1 TO 2 TIMES A DAY DX CODE E11.65 200 each 3  ? ?No current facility-administered medications on file prior to visit.  ? ?No Known Allergies ?Family History  ?Problem Relation Age of Onset  ? Other Mother   ?     healthy, lived to her 34's  ? Prostate cancer Father   ?     lived to his 16's  ? Congestive Heart Failure Father   ? Valvular heart disease Sister   ? Prostate cancer Brother   ?+ See HPI ? ?PE: ?BP (!) 140/92 (BP Location: Left  Arm, Patient Position: Sitting, Cuff Size: Normal)   Pulse 63   Ht 5' 10.87" (1.8 m)   Wt 220 lb 9.6 oz (100.1 kg)   SpO2 97%   BMI 30.88 kg/m?  ?Wt Readings from Last 3 Encounters:  ?  10/26/21 220 lb 9.6 oz (100.1 kg)  ?09/29/21 220 lb 1.9 oz (99.8 kg)  ?09/14/21 222 lb 1.9 oz (100.8 kg)  ? ?Constitutional: overweight, in NAD, walks with a cane ?Eyes: PERRLA, EOMI, no exophthalmos ?ENT: moist mucous membranes, no thyromegaly, no cervical lymphadenopathy ?Cardiovascular: RRR, No MRG, + edema in the right leg and arm ?Respiratory: CTA B ?Musculoskeletal: no deformities, strength intact in all 4 ?Skin: moist, warm, no rashes ?Neurological: no tremor with outstretched hands, DTR normal in all 4 ? ?ASSESSMENT: ?1. 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 ?2. HL ? ?3.  Overweight ? ?PLAN:  ?1. Patient with longstanding, type 2 diabetes, on basal insulin only as he refused other antidiabetic medications. He has a history of a stroke in 11/2017, after which she continues to have paresthesia in the right side of his body.  His diabetes control is very poor at the time of her stroke, with an HbA1c higher than 17%.  Afterwards, he excepted treatment with insulin only.   ?-At last visit, HbA1c was stable, at 6.8%.  Sugars were fairly well controlled, mostly at goal, with occasional spikes slightly above target.  Upon questioning, however, he was drinking 1 gallon of milk a week.  I strongly advised him to stop this after a discussion about the health risks. ?-At today's visit, his sugars are mostly at goal but he occasionally has hypoglycemic values in the morning.  Upon questioning, he has not been missing his Lantus.  However, he did relax his diet.  We discussed about the need to improve this and he his wife would like to start doing so.  For now, I recommended to stay on the same dose of Lantus but we may need  to intensify his regimen at next visit if the sugars remain higher than target. ?- I suggested to:  ?Patient Instructions  ?Please continue: ?- Lantus 25 units at night ? ?Please return in 4 months with yo

## 2021-10-27 LAB — LIPID PANEL
Cholesterol: 164 mg/dL (ref 0–200)
HDL: 36.3 mg/dL — ABNORMAL LOW (ref >39.00)
LDL Cholesterol: 101 mg/dL — ABNORMAL HIGH (ref 0–99)
NonHDL: 127.64
Total CHOL/HDL Ratio: 5
Triglycerides: 133 mg/dL (ref 0.0–149.0)
VLDL: 26.6 mg/dL (ref 0.0–40.0)

## 2021-11-03 MED ORDER — PRAVASTATIN SODIUM 20 MG PO TABS: 20.0000 mg | ORAL_TABLET | ORAL | 3 refills | Status: DC

## 2021-11-03 NOTE — Addendum Note (Signed)
Addended by: Philemon Kingdom on: 11/03/2021 04:37 PM ? ? Modules accepted: Orders ? ?

## 2021-11-09 DIAGNOSIS — E119 Type 2 diabetes mellitus without complications: Secondary | ICD-10-CM | POA: Diagnosis not present

## 2021-11-09 LAB — HM DIABETES EYE EXAM

## 2021-11-30 ENCOUNTER — Encounter: Payer: Self-pay | Admitting: Internal Medicine

## 2021-12-30 ENCOUNTER — Ambulatory Visit: Payer: Medicare HMO | Admitting: Nurse Practitioner

## 2022-02-01 ENCOUNTER — Other Ambulatory Visit: Payer: Self-pay | Admitting: Internal Medicine

## 2022-02-12 ENCOUNTER — Encounter: Payer: Self-pay | Admitting: Nurse Practitioner

## 2022-02-12 DIAGNOSIS — R197 Diarrhea, unspecified: Secondary | ICD-10-CM

## 2022-02-22 ENCOUNTER — Ambulatory Visit (INDEPENDENT_AMBULATORY_CARE_PROVIDER_SITE_OTHER): Payer: Medicare HMO | Admitting: Internal Medicine

## 2022-02-22 ENCOUNTER — Encounter: Payer: Self-pay | Admitting: Internal Medicine

## 2022-02-22 VITALS — BP 124/70 | HR 45 | Ht 70.0 in | Wt 210.8 lb

## 2022-02-22 DIAGNOSIS — E669 Obesity, unspecified: Secondary | ICD-10-CM

## 2022-02-22 DIAGNOSIS — E1159 Type 2 diabetes mellitus with other circulatory complications: Secondary | ICD-10-CM | POA: Diagnosis not present

## 2022-02-22 DIAGNOSIS — E785 Hyperlipidemia, unspecified: Secondary | ICD-10-CM | POA: Diagnosis not present

## 2022-02-22 DIAGNOSIS — E66811 Obesity, class 1: Secondary | ICD-10-CM

## 2022-02-22 DIAGNOSIS — E1165 Type 2 diabetes mellitus with hyperglycemia: Secondary | ICD-10-CM | POA: Diagnosis not present

## 2022-02-22 LAB — POCT GLYCOSYLATED HEMOGLOBIN (HGB A1C): Hemoglobin A1C: 6.6 % — AB (ref 4.0–5.6)

## 2022-02-22 NOTE — Patient Instructions (Signed)
Please continue: - Lantus 25 units at night  Please return in 4 months with your sugar log.  

## 2022-02-22 NOTE — Progress Notes (Signed)
Patient ID: Robert Brewer, male   DOB: 06/01/1946, 76 y.o.   MRN: [id number]   HPI: [name] is a 76 y.o.-year-old male, initially referred by Cathlyn Parsons, PA-C, returning for follow-up DM2, dx in 2009, insulin-dependent since 12/2018, uncontrolled, with complications (stroke 49/6759).  His wife, who is also my patient, accompanies patient today and offers more information about his medications, blood sugars, and diet.  Last visit 4 months ago. PCP: Ronnell Freshwater, NP, [place]  Interim history: No increased urination, blurry vision, nausea, chest pain.  He continues to have weakness on the right side of his body and disequilibrium.  He is walking with a cane.  Reviewed HbA1c levels Lab Results  Component Value Date   HGBA1C 7.0 (A) 10/26/2021   HGBA1C 6.8 (A) 06/15/2021   HGBA1C 6.8 (A) 02/05/2021   HGBA1C 6.8 (A) 08/13/2020   HGBA1C 6.8 (A) 04/16/2020   HGBA1C 6.7 (A) 12/09/2019   HGBA1C 6.8 (A) 09/09/2019   HGBA1C 6.2 (A) 06/10/2019   HGBA1C 6.5 (A) 03/19/2019   HGBA1C 13.3 (H) 12/17/2018   Pt is on a regimen of: - Lantus 25 units in am-started 12/2018, after his stroke>> moved at night He was on JanuMet in the past, but not in last 3 years.  Pt checks his sugars once a day: - am: 93-138, 172 >> 98-143, 162 >> 89, 97-156, 167 >> 97-158 - sick, 198 - 2h after b'fast: n/c >> 133 >> n/c - before lunch: 88, 95-152, 162, 190 >> 98-148, 157 >> 93-148 - sick - 2h after lunch: n/c - before dinner: 97-114, 181 >> n/c >> 93 >> n/c >> 95, 97 - 2h after dinner: n/c >> 163 >> n/c  - bedtime: 253 >> 198 >> n/c >> 182 >> n/c >> 213 - sick - nighttime: n/c Lowest sugar was 66 in the hospital >>... 77 >> 88 >> 89 >> 93; he has hypoglycemia awareness in the 70s.  Highest sugar was 233 >> ... 190 >>  167 >> 213.  Glucometer: ReliOn  Pt's meals are: - Breakfast: whole wheat cereal with milk, sometimes egg + toast + bacon; oatmeal or grits - Lunch: none - Dinner: steak +  potatoes + veggies; pizza; salad + mayo - Snacks: PB sandwich + milk He was drinking a lot of regular sodas and milk before the stroke, also eating a lot of icecream.  He stopped these after his stroke.  Since last visit, he also stopped milk.  No CKD, last BUN/creatinine:  Lab Results  Component Value Date   BUN 13 08/24/2021   BUN 17 08/13/2020   CREATININE 0.73 (L) 08/24/2021   CREATININE 0.83 08/13/2020  Not on ACE inhibitor/ARB.  + HL; last set of lipids: Lab Results  Component Value Date   CHOL 164 10/26/2021   HDL 36.30 (L) 10/26/2021   LDLCALC 101 (H) 10/26/2021   TRIG 133.0 10/26/2021   CHOLHDL 5 10/26/2021  05/24/2019: 176/56/50/? 02/15/2019: LDL 60 He was started on a statin after history of but he apparently stopped after LDL cholesterol decreased to 60.  He refused to restart it afterwards.  He also refused Zetia. Dr. Bettina Gavia recommended Pravastatin 20 mg 2x a week. He started this.  At last visit I advised him to increase this to every other day.  - last eye exam was on 11/09/2021: No DR.   -No numbness and tingling in his feet.  Pt has FH of DM in brother.  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   TSH from 01/24/2019: 0.577, normal.  ROS: + See HPI  Past Medical History:  Diagnosis Date   Diabetes ([place])    Stroke (cerebrum) ([place])     No past surgical history on file.   Social History   Socioeconomic History   Marital status: Married    Spouse name: Not on file   Number of children: 0   Years of education: Not on file   Highest education level: Not on file  Occupational History    Retired  Scientist, product/process development strain: Not on file   Food insecurity    Worry: Not on file    Inability: Not on Lexicographer needs    Medical: Not on file    Non-medical: Not on file  Tobacco Use   Smoking status: Never Smoker    Smokeless tobacco: Never Used  Substance and Sexual Activity   Alcohol use:  Beer    Frequency:  Rarely   Drug use: No   Current Outpatient Medications on File Prior to Visit  Medication Sig Dispense Refill   aspirin EC 81 MG EC tablet Take 1 tablet (81 mg total) by mouth daily.     BD PEN NEEDLE NANO 2ND GEN 32G X 4 MM MISC USE TO CHECK BLOOD SUGAR 1-2 TIMES DAILY 200 each 12   ciprofloxacin (CIPRO) 500 MG tablet Take 1 tablet (500 mg total) by mouth 2 (two) times daily. 20 tablet 0   gabapentin (NEURONTIN) 100 MG capsule Take 1 capsule po QHS prn nerve pain. May increase to 2 capsules po QHS as needed and as tolerated. 90 capsule 3   insulin glargine (LANTUS SOLOSTAR) 100 UNIT/ML Solostar Pen INJECT 25 UNITS INTO THE SKIN DAILY. 15 mL 11   Lancets Misc. (ACCU-CHEK SOFTCLIX LANCET DEV) KIT Use to check 1-2 times daily 1 kit 0   Multiple Vitamin (MULTIVITAMIN) tablet Take 1 tablet by mouth daily.     pravastatin (PRAVACHOL) 20 MG tablet Take 1 tablet (20 mg total) by mouth every other day. 45 tablet 3   TRUEplus Lancets 33G MISC CHECK BLOOD SUGAR 1 TO 2 TIMES A DAY DX CODE E11.65 200 each 3   No current facility-administered medications on file prior to visit.   No Known Allergies Family History  Problem Relation Age of Onset   Other Mother        healthy, lived to her 72's   Prostate cancer Father        lived to his 48's   Congestive Heart Failure Father    Valvular heart disease Sister    Prostate cancer Brother   + See HPI  PE: BP 124/70 (BP Location: Left Arm, Patient Position: Sitting, Cuff Size: Normal)   Pulse (!) 45   Ht _0  (1.778 m)   Wt 210 lb 12.8 oz (95.6 kg)   SpO2 97%   BMI 30.25 kg/m  Wt Readings from Last 3 Encounters:  02/22/22 210 lb 12.8 oz (95.6 kg)  10/26/21 220 lb 9.6 oz (100.1 kg)  09/29/21 220 lb 1.9 oz (99.8 kg)   Constitutional: overweight, in NAD, walks with a cane Eyes: EOMI, no exophthalmos ENT: moist mucous membranes, no thyromegaly,  no cervical lymphadenopathy Cardiovascular: RRR, No MRG, + edema in the right leg and arm Respiratory: CTA B Musculoskeletal: no deformities Skin: moist, warm, no rashes Neurological: no  tremor with outstretched hands Diabetic Foot Exam - Simple   Simple Foot Form Diabetic Foot exam was performed with the following findings: Yes 02/22/2022  4:01 PM  Visual Inspection See comments: Yes Sensation Testing Intact to touch and monofilament testing bilaterally: Yes Pulse Check Posterior Tibialis and Dorsalis pulse intact bilaterally: Yes Comments Long thick toenails bilaterally     ASSESSMENT: 1. 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 HL  3.  Overweight  PLAN:  1. Patient with longstanding, type 2 diabetes, on basal insulin only as he has refused other antidiabetic medications. He has a history of a stroke in 11/2017, after which she continues to have paresthesia in the right side of his body.  His diabetes control was very poor at the time of her stroke, with an HbA1c higher than 17%.  Afterwards, he accepted treatment with insulin only.  Subsequent HbA1c level was 6.8%. At last visit, HbA1c was slightly higher, at 7.0%, but still lower than before.  Sugars are mostly at goal but he occasionally had hyperglycemic values in the morning.  Upon questioning, he was missing Lantus doses and relaxed his diet.  We discussed about the need to improve his diet so we did not change his Lantus dose. -At today's visit, sugars are mostly at goal only slightly above, especially when he was sick.  No low blood sugars.  For now, we will continue the same regimen.  He continues to work on his diet and actually lost 10 pounds since last visit, intentionally.  He would like to come off insulin.  For now, I strongly suggested against this.  However, if sugars improve at next visit, we can start  decreasing the dose. - I suggested to:  Patient Instructions  Please continue: - Lantus 25 units at night  Please return in 4 months with your sugar log.   - we checked his HbA1c: 6.6% (lower) - advised to check sugars at different times of the day - 1x a day, rotating check times - advised for yearly eye exams >> he is UTD - return to clinic in 4 months  2. HL -Reviewed latest lipid panel from 10/2021: LDL above target, HDL slightly low: Lab Results  Component Value Date   CHOL 164 10/26/2021   HDL 36.30 (L) 10/26/2021   LDLCALC 101 (H) 10/26/2021   TRIG 133.0 10/26/2021   CHOLHDL 5 10/26/2021  -he refused ezetimibe in the past.  He finally agreed to start pravastatin 20 mg twice a week and after the above results returned, I advised him to try to take it every other day.  3.  Obesity class 1 -Unfortunately, he refused a GLP-1 receptor agonist, which would have helped with weight loss -He lost 3 pounds before last visit, 10 more since then  Philemon Kingdom, MD PhD [facility] Endocrinology

## 2022-03-07 ENCOUNTER — Encounter: Payer: Self-pay | Admitting: Gastroenterology

## 2022-04-13 ENCOUNTER — Ambulatory Visit: Payer: Medicare HMO | Admitting: Gastroenterology

## 2022-05-04 ENCOUNTER — Encounter: Payer: Self-pay | Admitting: Cardiology

## 2022-05-04 ENCOUNTER — Ambulatory Visit: Payer: Medicare HMO | Attending: Cardiology | Admitting: Cardiology

## 2022-05-04 VITALS — BP 121/69 | HR 74 | Ht 72.0 in | Wt 202.0 lb

## 2022-05-04 DIAGNOSIS — R030 Elevated blood-pressure reading, without diagnosis of hypertension: Secondary | ICD-10-CM | POA: Diagnosis not present

## 2022-05-04 DIAGNOSIS — I639 Cerebral infarction, unspecified: Secondary | ICD-10-CM | POA: Diagnosis not present

## 2022-05-04 DIAGNOSIS — E1149 Type 2 diabetes mellitus with other diabetic neurological complication: Secondary | ICD-10-CM

## 2022-05-04 DIAGNOSIS — Z794 Long term (current) use of insulin: Secondary | ICD-10-CM | POA: Diagnosis not present

## 2022-05-04 DIAGNOSIS — E785 Hyperlipidemia, unspecified: Secondary | ICD-10-CM | POA: Diagnosis not present

## 2022-05-04 NOTE — Progress Notes (Signed)
Cardiology Office Note:    Date:  05/04/2022   ID:  Robert Brewer, DOB 1946/01/05, MRN [id number]  PCP:  Robert Freshwater, NP  Cardiologist:  Shirlee More, MD    Referring MD: Robert Freshwater, NP    ASSESSMENT:    1. Cerebrovascular accident (CVA), unspecified mechanism ([place])   2. Type 2 diabetes mellitus with other neurologic complication, with long-term current use of insulin ([facility])   3. Elevated blood pressure reading   4. Hyperlipidemia LDL goal <70    PLAN:    In order of problems listed above:  In this situation of embolic stroke approximately 30% of the time atrial fibrillation is documented.  Unfortunately he has declined implanted loop recorder I think he benefit from a smart watch to screen for atrial fibrillation paired with his smart phone.  His wife said she go ahead and get that set up he continues to take low-dose aspirin and is not on lipid-lowering therapy Stable diabetes managed by his PCP on insulin Normal blood pressure today Poorly controlled not on lipid-lowering therapy He will have referral to GI at the request of the patient and his wife for persistent diarrhea and weight loss   Next appointment: 1 year   Medication Adjustments/Labs and Tests Ordered: Current medicines are reviewed at length with the patient today.  Concerns regarding medicines are outlined above.  No orders of the defined types were placed in this encounter.  No orders of the defined types were placed in this encounter.   Chief complaint follow-up for embolic stroke   History of Present Illness:    Robert Brewer is a 77 y.o. male with a hx of diabetes hypertension and cryptogenic stroke l who presented on 12/17/2018 with right sided weakness and facial droop. tPA Given at [facility] and transferred to [facility].  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  He was seen Dr. Curt Bears EP 12/20/2018 at that time he mentioned there is no arrhythmia noticed on telemetry advised implanted loop recorder patient declined.  He was last seen 01/28/2021 and has refused lipid-lowering therapy poststroke.  Compliance with diet, lifestyle and medications: Yes  He is seen in follow-up for suspected embolic stroke and fortunately has had no recurrent neurologic event or documented atrial arrhythmia. I urged him to get a Fitbit watch to monitor for atrial fibrillation His concern is ongoing chronic diarrhea and request referral to GI No cardiovascular symptoms of palpitation edema shortness of breath chest pain or syncope Past Medical History:  Diagnosis Date   Diabetes ([place])    Stroke (cerebrum) ([place])     History reviewed. No pertinent surgical history.  Current Medications: Current Meds  Medication Sig   aspirin EC 81 MG EC tablet Take 1 tablet (81 mg total) by mouth daily.   BD PEN NEEDLE NANO 2ND GEN 32G X 4 MM MISC USE TO CHECK BLOOD SUGAR 1-2 TIMES DAILY   ciprofloxacin (CIPRO) 500 MG tablet Take 1 tablet (500 mg total) by mouth 2 (two) times daily.   insulin glargine (LANTUS SOLOSTAR) 100 UNIT/ML Solostar Pen INJECT 25 UNITS INTO THE SKIN DAILY.   Lancets Misc. (ACCU-CHEK SOFTCLIX LANCET DEV) KIT Use to check 1-2 times daily   Multiple Vitamin (MULTIVITAMIN) tablet Take 1 tablet by mouth daily.   TRUEplus Lancets 33G MISC  CHECK BLOOD SUGAR 1 TO 2 TIMES A DAY DX CODE E11.65     Allergies:   Pravastatin   Social History   Socioeconomic History   Marital status: Married    Spouse name: Not on file   Number of children: Not on file   Years of education: Not on file   Highest education level: Not on file  Occupational History   Not on file  Tobacco Use   Smoking status: Never   Smokeless tobacco: Never  Vaping Use   Vaping Use: Never used   Substance and Sexual Activity   Alcohol use: Never   Drug use: Never   Sexual activity: Not Currently  Other Topics Concern   Not on file  Social History Narrative   Not on file   Social Determinants of Health   Financial Resource Strain: Not on file  Food Insecurity: Not on file  Transportation Needs: Not on file  Physical Activity: Not on file  Stress: Not on file  Social Connections: Not on file     Family History: The patient's family history includes Congestive Heart Failure in his father; Other in his mother; Prostate cancer in his brother and father; Valvular heart disease in his sister. ROS:   Please see the history of present illness.    All other systems reviewed and are negative.  EKGs/Labs/Other Studies Reviewed:    The following studies were reviewed today:  EKG:  EKG ordered today and personally reviewed.  The ekg ordered today demonstrates sinus rhythm frequent APCs otherwise normal EKG  Recent Labs: 08/24/2021: ALT 17; BUN 13; Creatinine, Ser 0.73; Hemoglobin 14.5; Platelets 262; Potassium 5.0; Sodium 139; TSH 1.260  Recent Lipid Panel    Component Value Date/Time   CHOL 164 10/26/2021 1608   TRIG 133.0 10/26/2021 1608   HDL 36.30 (L) 10/26/2021 1608   CHOLHDL 5 10/26/2021 1608   VLDL 26.6 10/26/2021 1608   LDLCALC 101 (H) 10/26/2021 1608    Physical Exam:    VS:  BP 121/69   Pulse 74   Ht 6' (1.829 m)   Wt 202 lb (91.6 kg)   SpO2 97%   BMI 27.40 kg/m     Wt Readings from Last 3 Encounters:  05/04/22 202 lb (91.6 kg)  02/22/22 210 lb 12.8 oz (95.6 kg)  10/26/21 220 lb 9.6 oz (100.1 kg)     GEN:  Well nourished, well developed in no acute distress HEENT: Normal NECK: No JVD; No carotid bruits LYMPHATICS: No lymphadenopathy CARDIAC: RRR, no murmurs, rubs, gallops RESPIRATORY:  Clear to auscultation without rales, wheezing or rhonchi  ABDOMEN: Soft, non-tender, non-distended MUSCULOSKELETAL:  No edema; No deformity  SKIN: Warm and  dry NEUROLOGIC:  Alert and oriented x 3 PSYCHIATRIC:  Normal affect    Signed, Shirlee More, MD  05/04/2022 4:18 PM    [facility]

## 2022-05-04 NOTE — Patient Instructions (Signed)
Medication Instructions:  Your physician recommends that you continue on your current medications as directed. Please refer to the Current Medication list given to you today.  *If you need a refill on your cardiac medications before your next appointment, please call your pharmacy*   Lab Work: NONE If you have labs (blood work) drawn today and your tests are completely normal, you will receive your results only by: [place] (if you have MyChart) OR A paper copy in the mail If you have any lab test that is abnormal or we need to change your treatment, we will call you to review the results.   Testing/Procedures: NONE   Follow-Up: At [facility], you and your health needs are our priority.  As part of our continuing mission to provide you with exceptional heart care, we have created designated Provider Care Teams.  These Care Teams include your primary Cardiologist (physician) and Advanced Practice Providers (APPs -  Physician Assistants and Nurse Practitioners) who all work together to provide you with the care you need, when you need it.  We recommend signing up for the patient portal called "MyChart".  Sign up information is provided on this After Visit Summary.  MyChart is used to connect with patients for Virtual Visits (Telemedicine).  Patients are able to view lab/test results, encounter notes, upcoming appointments, etc.  Non-urgent messages can be sent to your provider as well.   To learn more about what you can do with MyChart, go to [email or web address].    Your next appointment:   1 year(s)  The format for your next appointment:   In Person  Provider:   Shirlee More, MD    Other Instructions Get a FitBit watch for Atrial Fibrillation  Important Information About Sugar

## 2022-05-06 ENCOUNTER — Encounter: Payer: Self-pay | Admitting: Physician Assistant

## 2022-06-08 ENCOUNTER — Encounter: Payer: Self-pay | Admitting: Physician Assistant

## 2022-06-08 ENCOUNTER — Other Ambulatory Visit (INDEPENDENT_AMBULATORY_CARE_PROVIDER_SITE_OTHER): Payer: Medicare HMO

## 2022-06-08 ENCOUNTER — Ambulatory Visit: Payer: Medicare HMO | Admitting: Physician Assistant

## 2022-06-08 VITALS — BP 106/74 | HR 85 | Ht 70.0 in | Wt 201.0 lb

## 2022-06-08 DIAGNOSIS — K602 Anal fissure, unspecified: Secondary | ICD-10-CM

## 2022-06-08 DIAGNOSIS — K641 Second degree hemorrhoids: Secondary | ICD-10-CM | POA: Diagnosis not present

## 2022-06-08 DIAGNOSIS — K6289 Other specified diseases of anus and rectum: Secondary | ICD-10-CM | POA: Diagnosis not present

## 2022-06-08 DIAGNOSIS — K921 Melena: Secondary | ICD-10-CM | POA: Diagnosis not present

## 2022-06-08 DIAGNOSIS — K529 Noninfective gastroenteritis and colitis, unspecified: Secondary | ICD-10-CM

## 2022-06-08 DIAGNOSIS — R634 Abnormal weight loss: Secondary | ICD-10-CM

## 2022-06-08 LAB — COMPREHENSIVE METABOLIC PANEL
ALT: 12 U/L (ref 0–53)
AST: 14 U/L (ref 0–37)
Albumin: 4.1 g/dL (ref 3.5–5.2)
Alkaline Phosphatase: 69 U/L (ref 39–117)
BUN: 19 mg/dL (ref 6–23)
CO2: 30 mEq/L (ref 19–32)
Calcium: 8.9 mg/dL (ref 8.4–10.5)
Chloride: 104 mEq/L (ref 96–112)
Creatinine, Ser: 0.76 mg/dL (ref 0.40–1.50)
GFR: 88.82 mL/min (ref >60.00)
Glucose, Bld: 94 mg/dL (ref 70–99)
Potassium: 3.9 mEq/L (ref 3.5–5.1)
Sodium: 139 mEq/L (ref 135–145)
Total Bilirubin: 0.6 mg/dL (ref 0.2–1.2)
Total Protein: 6.8 g/dL (ref 6.0–8.3)

## 2022-06-08 LAB — CBC WITH DIFFERENTIAL/PLATELET
Basophils Absolute: 0.1 10*3/uL (ref 0.0–0.1)
Basophils Relative: 1.1 % (ref 0.0–3.0)
Eosinophils Absolute: 0.2 10*3/uL (ref 0.0–0.7)
Eosinophils Relative: 1.4 % (ref 0.0–5.0)
HCT: 31.9 % — ABNORMAL LOW (ref 39.0–52.0)
Hemoglobin: 10.2 g/dL — ABNORMAL LOW (ref 13.0–17.0)
Lymphocytes Relative: 25.3 % (ref 12.0–46.0)
Lymphs Abs: 2.8 10*3/uL (ref 0.7–4.0)
MCHC: 32 g/dL (ref 30.0–36.0)
MCV: 72.1 fl — ABNORMAL LOW (ref 78.0–100.0)
Monocytes Absolute: 0.7 10*3/uL (ref 0.1–1.0)
Monocytes Relative: 6.4 % (ref 3.0–12.0)
Neutro Abs: 7.3 10*3/uL (ref 1.4–7.7)
Neutrophils Relative %: 65.8 % (ref 43.0–77.0)
Platelets: 318 10*3/uL (ref 150.0–400.0)
RBC: 4.43 Mil/uL (ref 4.22–5.81)
RDW: 15.9 % — ABNORMAL HIGH (ref 11.5–15.5)
WBC: 11.1 10*3/uL — ABNORMAL HIGH (ref 4.0–10.5)

## 2022-06-08 MED ORDER — DILTIAZEM GEL 2 %: CUTANEOUS | 1 refills | Status: DC

## 2022-06-08 MED ORDER — CLENPIQ 10-3.5-12 MG-GM -GM/160ML PO SOLN: 1.0000 | ORAL | 0 refills | Status: DC

## 2022-06-08 MED ORDER — DIPHENOXYLATE-ATROPINE 2.5-0.025 MG PO TABS: ORAL_TABLET | ORAL | 0 refills | Status: DC

## 2022-06-08 MED ORDER — AMBULATORY NON FORMULARY MEDICATION: 1 refills | Status: DC

## 2022-06-08 NOTE — Addendum Note (Signed)
Addended by: Schuyler Amor on: 06/08/2022 04:44 PM   Modules accepted: Orders

## 2022-06-08 NOTE — Patient Instructions (Addendum)
If you are age 74 or older, your body mass index should be between 23-30. Your Body mass index is 28.84 kg/m. If this is out of the aforementioned range listed, please consider follow up with your Primary Care Provider.  If you are age 54 or younger, your body mass index should be between 19-25. Your Body mass index is 28.84 kg/m. If this is out of the aformentioned range listed, please consider follow up with your Primary Care Provider.   ________________________________________________________  The  GI providers would like to encourage you to use [facility] to communicate with providers for non-urgent requests or questions.  Due to long hold times on the telephone, sending your provider a message by [facility] may be a faster and more efficient way to get a response.  Please allow 48 business hours for a response.  Please remember that this is for non-urgent requests.   You have been scheduled for a colonoscopy. Please follow written instructions given to you at your visit today.  Please pick up your prep supplies at the pharmacy within the next 1-3 days. If you use inhalers (even only as needed), please bring them with you on the day of your procedure.   Your provider has requested that you go to the basement level for lab work before leaving today. Press "B" on the elevator. The lab is located at the first door on the left as you exit the elevator.   We have sent the following medications to your pharmacy for you to pick up at your convenience:   Start lomotil 1 tablet every 4-6 hours or as needed. Do not take while prepping for colonoscopy procedure   We have sent a prescription for Diltiazem 2% gel to [facility] for you. Using your index finger, you should apply a small amount of medication inside the rectum up to your first knuckle/joint 3 times daily for 6-8 weeks.  [facility]'s information is below: Address: [address], [place], [place] [address]  [phone]  [phone]  *Please DO NOT go directly from our office to pick up this medication! Give the pharmacy 1 day to process the prescription as this is compounded and takes time to make.   Please purchase the following medications over the counter and take as directed:  Use recticare with lidocaine as directed  What is a sitz bath? A sitz bath is a warm soothing soak for your perineal or bottom area (area between your legs including your anus and scrotum). The soak is made up of water and baking soda (sodium bicarbonate) or salt. You can buy baking soda or salt in a drug or grocery store. How do I take a sitz bath? There are 2 ways you can take a sitz bath.  You may use: 1. A plastic sitz bath that fits onto your toilet.  2. Your bathtub at home.  Sitz baths are available in the hospital or at your local drug store. Using a plastic sitz bath on the toilet 1. Rinse the sitz bath to remove any soap or salt residue. 2. Lift the toilet seat and put the plastic sitz bath in the toilet bowl. 3. Fill the plastic sitz bath two-thirds (2/3) full with warm water, not hot water. The water temperature should be 37 Celsius to 39  Celsius or 99 Farhenheit to [place]. If the water feels too warm on your wrist, it is too hot to sit in. 4. Add  to 1 tablespoon (5 mL to 15  mL) of baking soda or 1 to 2 teaspoons (5 mL to 10 mL) of salt to the water in the plastic sitz bath. Swirl the water until the baking soda or salt dissolves. 5. Carefully sit down in the plastic sitz bath and soak your bottom area for 10 to 15 minutes. As you sit down, the extra water will spill into the toilet through the openings in the plastic sitz bath. 6. When finished, dry your bottom by patting the area with a clean lint-free towel. Another way to dry the area is to use a blow dryer set on low or use a hand-held fan. You could also lie down and rest as your bottom area dries. 7. Try and leave your bottom open to the air as much as  possible. Use cotton underwear with no elastic along the leg holes. Oversized boxer shorts are great. 8. Clean the plastic sitz bath each time you use it. Using your bathtub at home 1. Rinse the bathtub before using to remove any soap or salt residue. 2. Fill the bathtub with enough warm water to cover your thighs, or about 5 inches deep. 3. The temperature of the water should be warm, not hot--about 99 Fahrenheit to 102 Fahrenheit. If the water feels too warm on your wrist, it is too hot to sit in. 4. Add  cup (125 mL) of baking soda or  cup (75 mL) of salt to the bath water. Swirl the water until the baking soda or salt is dissolved. 5. Carefully enter the bath, sit down and soak your bottom area for 10 to 15 minutes. Lean backwards in the tub, rather than sitting directly on your bottom, so the water can reach the whole area. 6. When finished get out of the bath and dry your bottom by patting the area with a clean, lint-free towel. Another way to dry the area is to use a blow dryer set on low or use a hand-held  fan. You could also lie down and rest until your bottom area dries. 7. Try and leave your bottom open to the air as much as possible. Use cotton underwear with no elastic along the leg holes. Oversized boxer shorts are great.   Due to recent changes in healthcare laws, you may see the results of your imaging and laboratory studies on MyChart before your provider has had a chance to review them.  We understand that in some cases there may be results that are confusing or concerning to you. Not all laboratory results come back in the same time frame and the provider may be waiting for multiple results in order to interpret others.  Please give [place] 48 hours in order for your provider to thoroughly review all the results before contacting the office for clarification of your results.    Thank you for entrusting me with your care and choosing [facility].  Ellouise Newer PA-C
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
## 2022-06-09 LAB — IGA: Immunoglobulin A: 256 mg/dL (ref 70–320)

## 2022-06-09 LAB — TISSUE TRANSGLUTAMINASE, IGA: (tTG) Ab, IgA: <1 U/mL

## 2022-06-12 NOTE — Progress Notes (Signed)
Agree with assessment/plan.  Raj Jp Eastham, MD [facility] [phone]  

## 2022-06-13 ENCOUNTER — Other Ambulatory Visit: Payer: Medicare HMO

## 2022-06-13 DIAGNOSIS — K6289 Other specified diseases of anus and rectum: Secondary | ICD-10-CM | POA: Diagnosis not present

## 2022-06-13 DIAGNOSIS — K529 Noninfective gastroenteritis and colitis, unspecified: Secondary | ICD-10-CM | POA: Diagnosis not present

## 2022-06-13 DIAGNOSIS — K921 Melena: Secondary | ICD-10-CM | POA: Diagnosis not present

## 2022-06-13 DIAGNOSIS — K602 Anal fissure, unspecified: Secondary | ICD-10-CM

## 2022-06-13 DIAGNOSIS — K641 Second degree hemorrhoids: Secondary | ICD-10-CM

## 2022-06-13 DIAGNOSIS — R634 Abnormal weight loss: Secondary | ICD-10-CM | POA: Diagnosis not present

## 2022-06-14 LAB — CLOSTRIDIUM DIFFICILE TOXIN B, QUALITATIVE, REAL-TIME PCR: Toxigenic C. Difficile by PCR: NOT DETECTED

## 2022-06-15 LAB — GI PROFILE, STOOL, PCR

## 2022-06-15 LAB — FECAL LACTOFERRIN, QUANT
Fecal Lactoferrin: POSITIVE — AB
MICRO NUMBER:: 14212592
SPECIMEN QUALITY:: ADEQUATE

## 2022-06-17 LAB — CALPROTECTIN, FECAL: Calprotectin, Fecal: 62 ug/g (ref 0–120)

## 2022-06-30 ENCOUNTER — Encounter: Payer: Self-pay | Admitting: Internal Medicine

## 2022-06-30 ENCOUNTER — Ambulatory Visit: Payer: Medicare HMO | Admitting: Internal Medicine

## 2022-06-30 VITALS — BP 138/80 | HR 64 | Ht 70.0 in | Wt 199.6 lb

## 2022-06-30 DIAGNOSIS — E669 Obesity, unspecified: Secondary | ICD-10-CM | POA: Diagnosis not present

## 2022-06-30 DIAGNOSIS — E785 Hyperlipidemia, unspecified: Secondary | ICD-10-CM | POA: Diagnosis not present

## 2022-06-30 DIAGNOSIS — E1165 Type 2 diabetes mellitus with hyperglycemia: Secondary | ICD-10-CM | POA: Diagnosis not present

## 2022-06-30 DIAGNOSIS — E1159 Type 2 diabetes mellitus with other circulatory complications: Secondary | ICD-10-CM

## 2022-06-30 LAB — POCT GLYCOSYLATED HEMOGLOBIN (HGB A1C): Hemoglobin A1C: 5.7 % — AB (ref 4.0–5.6)
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
## 2022-06-30 NOTE — Patient Instructions (Signed)
Please continue: - Lantus 25 units at night  Please return in 4 months with your sugar log.

## 2022-07-01 ENCOUNTER — Telehealth: Payer: Self-pay | Admitting: Physician Assistant

## 2022-07-01 NOTE — Telephone Encounter (Signed)
Inbound call from patients wife requesting a call back to discuss procedure diet restrictions and questions about procedure. Patient is scheduled for procedure on 12/13 at 3:00 with Dr. Lyndel Safe. Please advise.

## 2022-07-05 ENCOUNTER — Encounter: Payer: Self-pay | Admitting: Certified Registered Nurse Anesthetist

## 2022-07-06 ENCOUNTER — Ambulatory Visit: Payer: Medicare HMO | Admitting: Gastroenterology

## 2022-07-06 DIAGNOSIS — Z86010 Personal history of colonic polyps: Secondary | ICD-10-CM

## 2022-07-06 NOTE — Progress Notes (Signed)
Patient is still having formed stool after the Clenpiq prep  - right up until he arrived in the office.  Dr. Lyndel Safe informed and offered appointment tomorrow.  Patient and wife do not think that they will be able to arrive in time and asked to be rescheduled until a later date. Appointment scheduled for January 22 at 4pm.  Jess Barters, RN made appointment and gave prep instructions for Miralax.

## 2022-07-06 NOTE — Progress Notes (Signed)
Pt given Plenvu sample for 2 day bowel prep for procedure rescheduled for Monday 08/15/22 at 4pm. Provided printed instructions and sent them via [place]. RN reviewed instructions in detail with pt and his wife and answered all questions. They both verbalized understanding.

## 2022-07-06 NOTE — Progress Notes (Signed)
patient was not prepped today  we offered him repeat preparation followed by colonoscopy tomorrow.   Due to his scheduling conflicts, he would want to get it done in January 2024.  RG

## 2022-07-19 ENCOUNTER — Other Ambulatory Visit: Payer: Self-pay | Admitting: Physician Assistant

## 2022-08-15 ENCOUNTER — Encounter: Payer: Medicare HMO | Admitting: Gastroenterology

## 2022-09-19 ENCOUNTER — Ambulatory Visit (AMBULATORY_SURGERY_CENTER): Payer: Medicare HMO | Admitting: Gastroenterology

## 2022-09-19 ENCOUNTER — Encounter: Payer: Self-pay | Admitting: Gastroenterology

## 2022-09-19 ENCOUNTER — Other Ambulatory Visit (INDEPENDENT_AMBULATORY_CARE_PROVIDER_SITE_OTHER): Payer: Medicare HMO

## 2022-09-19 VITALS — BP 126/66 | HR 69 | Temp 96.8°F | Resp 18 | Ht 70.0 in | Wt 201.0 lb

## 2022-09-19 DIAGNOSIS — K529 Noninfective gastroenteritis and colitis, unspecified: Secondary | ICD-10-CM

## 2022-09-19 DIAGNOSIS — K6289 Other specified diseases of anus and rectum: Secondary | ICD-10-CM

## 2022-09-19 DIAGNOSIS — D122 Benign neoplasm of ascending colon: Secondary | ICD-10-CM

## 2022-09-19 DIAGNOSIS — C20 Malignant neoplasm of rectum: Secondary | ICD-10-CM | POA: Diagnosis not present

## 2022-09-19 DIAGNOSIS — E119 Type 2 diabetes mellitus without complications: Secondary | ICD-10-CM | POA: Diagnosis not present

## 2022-09-19 DIAGNOSIS — R197 Diarrhea, unspecified: Secondary | ICD-10-CM | POA: Diagnosis not present

## 2022-09-19 DIAGNOSIS — I10 Essential (primary) hypertension: Secondary | ICD-10-CM | POA: Diagnosis not present

## 2022-09-19 MED ORDER — SODIUM CHLORIDE 0.9 % IV SOLN: 500.0000 mL | Freq: Once | INTRAVENOUS | Status: DC

## 2022-09-19 NOTE — Progress Notes (Unsigned)
Called to room to assist during endoscopic procedure.  Patient ID and intended procedure confirmed with present staff. Received instructions for my participation in the procedure from the performing physician.  
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
## 2022-09-19 NOTE — Patient Instructions (Addendum)
The polyps removed/biopsies taken today have been sent for pathology and will be resulted [facility], therefore 2-4 business days.    You may resume your previous diet and medication schedule. LABS TO BE DRAWN TODAY : CEA, CBC, CMP CT SCAN TO BE SCHEDULED, YOU WILL BE NOTIFIED IN THE NEXT FEW DAYS WHEN AND WHERE TO GO FOR THIS TEST You will be referred to an oncologist after the results are received.  YOU HAD AN ENDOSCOPIC PROCEDURE TODAY AT [facility]:   Refer to the procedure report that was given to you for any specific questions about what was found during the examination.  If the procedure report does not answer your questions, please call your gastroenterologist to clarify.  If you requested that your care partner not be given the details of your procedure findings, then the procedure report has been included in a sealed envelope for you to review at your convenience later.  YOU SHOULD EXPECT: Some feelings of bloating in the abdomen. Passage of more gas than usual.  Walking can help get rid of the air that was put into your GI tract during the procedure and reduce the bloating. If you had a lower endoscopy (such as a colonoscopy or flexible sigmoidoscopy) you may notice spotting of blood in your stool or on the toilet paper. If you underwent a bowel prep for your procedure, you may not have a normal bowel movement for a few days.  Please Note:  You might notice some irritation and congestion in your nose or some drainage.  This is from the oxygen used during your procedure.  There is no need for concern and it should clear up in a day or so.  SYMPTOMS TO REPORT IMMEDIATELY:  Following lower endoscopy (colonoscopy):  Excessive amounts of blood in the stool  Significant tenderness or worsening of abdominal pains  Swelling of the abdomen that is new, acute  Fever of 100F or higher  For urgent or emergent issues, a gastroenterologist can be reached at any hour by calling [phone]  [phone]. Do not use MyChart messaging for urgent concerns.   DIET:  We do recommend a small meal at first, but then you may proceed to your regular diet.  Drink plenty of fluids but you should avoid alcoholic beverages for 24 hours.  ACTIVITY:  You should plan to take it easy for the rest of today and you should NOT DRIVE or use heavy machinery until tomorrow (because of the sedation medicines used during the test).    FOLLOW UP: Our staff will call the number listed on your records the next business day following your procedure.  We will call around 7:15- 8:00 am to check on you and address any questions or concerns that you may have regarding the information given to you following your procedure. If we do not reach you, we will leave a message.       SIGNATURES/CONFIDENTIALITY: You and/or your care partner have signed paperwork which will be entered into your electronic medical record.  These signatures attest to the fact that that the information above on your After Visit Summary has been reviewed and is understood.  Full responsibility of the confidentiality of this discharge information lies with you and/or your care-partner.

## 2022-09-19 NOTE — Progress Notes (Signed)
Pt resting comfortably. VSS. Airway intact. SBAR complete to RN. All questions answered.   

## 2022-09-19 NOTE — Op Note (Signed)
[facility] Patient Name: Robert Brewer Procedure Date: 09/19/2022 3:58 PM MRN: [id number] Endoscopist: Jackquline Denmark , MD, [id number] Age: 77 Referring MD:  Date of Birth: 07/13/1946 Gender: Male Account #: [id number] Procedure:                Colonoscopy Indications:              Clinically significant diarrhea of unexplained                            origin, Rectal bleeding Medicines:                Monitored Anesthesia Care Procedure:                Pre-Anesthesia Assessment:                           - Prior to the procedure, a History and Physical                            was performed, and patient medications and                            allergies were reviewed. The patient's tolerance of                            previous anesthesia was also reviewed. The risks                            and benefits of the procedure and the sedation                            options and risks were discussed with the patient.                            All questions were answered, and informed consent                            was obtained. Prior Anticoagulants: The patient has                            taken no anticoagulant or antiplatelet agents. ASA                            Grade Assessment: III - A patient with severe                            systemic disease. After reviewing the risks and                            benefits, the patient was deemed in satisfactory                            condition to undergo the procedure.  After obtaining informed consent, the colonoscope                            was passed under direct vision. Throughout the                            procedure, the patient's blood pressure, pulse, and                            oxygen saturations were monitored continuously. The                            Olympus PCF-H190DL [id number]) Colonoscope was                            introduced through the anus and advanced  to the the                            cecum, identified by appendiceal orifice and                            ileocecal valve. The colonoscopy was performed                            without difficulty. The patient tolerated the                            procedure well. The quality of the bowel                            preparation was poor. The ileocecal valve,                            appendiceal orifice, and rectum were photographed. Scope In: 3:58:44 PM Scope Out: 4:25:06 PM Scope Withdrawal Time: 0 hours 19 minutes 35 seconds  Total Procedure Duration: 0 hours 26 minutes 22 seconds  Findings:                 A large 5 cm , fungating, infiltrative, polypoid                            and ulcerated mass was found in the mid rectum and                            in the distal rectum, 2 cm from the dentate line.                            This was palpated on rectal exam (4 cm from the                            anal verge). The mass was circumferential with  luminal diameter of approximately 12 mm which did                            allow the passage of pediatric colonoscope easily.                            This was friable and would bleed easily to touch.                            This was biopsied with a cold forceps for                            histology. Area (both proximally and distally) was                            tattooed with an injection of Spot (carbon black) 1                            cc each in incremental doses of 0.5 cc each.                           An 8 mm polyp was found in the proximal ascending                            colon. The polyp was sessile. The polyp was removed                            with a cold snare. Resection and retrieval were                            complete.                           A moderate amount of stool was found in the                            descending colon, in the ascending colon and in  the                            cecum, making visualization difficult. Lavage of                            the area was performed, resulting in incomplete                            clearance with fair visualization. This was despite                            2-day prep. Complications:            No immediate complications. Estimated Blood Loss:     Estimated blood loss: none. Impression:               - Circumferential rectal  mass (biopsied).                           - One 8 mm polyp in the proximal ascending colon,                            removed with a cold snare. Resected and retrieved.                           - Fair prep. Recommendation:           - Patient has a contact number available for                            emergencies. The signs and symptoms of potential                            delayed complications were discussed with the                            patient. Return to normal activities tomorrow.                            Written discharge instructions were provided to the                            patient.                           - Resume previous diet.                           - Continue present medications.                           - Await pathology results. Send [facility].                           - Check CBC, CMP and CEA today.                           - CT chest/Abdo/pelvis with contrast for metastatic                            workup.                           - Thereafter, will need oncology consultation                           - The findings and recommendations were discussed                            with the patient's family. Jackquline Denmark, MD 09/19/2022 4:38:22 PM This report has been signed electronically.

## 2022-09-20 ENCOUNTER — Telehealth: Payer: Self-pay

## 2022-09-20 ENCOUNTER — Other Ambulatory Visit: Payer: Self-pay

## 2022-09-20 DIAGNOSIS — K6289 Other specified diseases of anus and rectum: Secondary | ICD-10-CM

## 2022-09-20 DIAGNOSIS — K529 Noninfective gastroenteritis and colitis, unspecified: Secondary | ICD-10-CM

## 2022-09-20 DIAGNOSIS — R634 Abnormal weight loss: Secondary | ICD-10-CM

## 2022-09-20 LAB — CBC WITH DIFFERENTIAL/PLATELET
Basophils Absolute: 0 10*3/uL (ref 0.0–0.1)
Basophils Relative: 0.4 % (ref 0.0–3.0)
Eosinophils Absolute: 0 10*3/uL (ref 0.0–0.7)
Eosinophils Relative: 0.4 % (ref 0.0–5.0)
HCT: 33.7 % — ABNORMAL LOW (ref 39.0–52.0)
Hemoglobin: 10.8 g/dL — ABNORMAL LOW (ref 13.0–17.0)
Lymphocytes Relative: 13.9 % (ref 12.0–46.0)
Lymphs Abs: 1.3 10*3/uL (ref 0.7–4.0)
MCHC: 32.1 g/dL (ref 30.0–36.0)
MCV: 78.1 fl (ref 78.0–100.0)
Monocytes Absolute: 0.5 10*3/uL (ref 0.1–1.0)
Monocytes Relative: 4.8 % (ref 3.0–12.0)
Neutro Abs: 7.8 10*3/uL — ABNORMAL HIGH (ref 1.4–7.7)
Neutrophils Relative %: 80.5 % — ABNORMAL HIGH (ref 43.0–77.0)
Platelets: 283 10*3/uL (ref 150.0–400.0)
RBC: 4.31 Mil/uL (ref 4.22–5.81)
RDW: 16.4 % — ABNORMAL HIGH (ref 11.5–15.5)
WBC: 9.7 10*3/uL (ref 4.0–10.5)

## 2022-09-20 LAB — COMPREHENSIVE METABOLIC PANEL
ALT: 13 U/L (ref 0–53)
AST: 17 U/L (ref 0–37)
Albumin: 3.7 g/dL (ref 3.5–5.2)
Alkaline Phosphatase: 73 U/L (ref 39–117)
BUN: 18 mg/dL (ref 6–23)
CO2: 27 mEq/L (ref 19–32)
Calcium: 8.8 mg/dL (ref 8.4–10.5)
Chloride: 106 mEq/L (ref 96–112)
Creatinine, Ser: 0.72 mg/dL (ref 0.40–1.50)
GFR: 90.1 mL/min (ref >60.00)
Glucose, Bld: 91 mg/dL (ref 70–99)
Potassium: 3.9 mEq/L (ref 3.5–5.1)
Sodium: 143 mEq/L (ref 135–145)
Total Bilirubin: 0.6 mg/dL (ref 0.2–1.2)
Total Protein: 6.2 g/dL (ref 6.0–8.3)

## 2022-09-20 LAB — CEA: CEA: 43.1 ng/mL — ABNORMAL HIGH

## 2022-09-20 NOTE — Telephone Encounter (Signed)
Dr. Lyndel Safe recommendations after pt Colonoscopy were ( CT chest, abdo, pelvis with contrast for metastatic workup) CT ordered and scheduled for 09/26/2022 at 2:00 PM at [facility] At 2:00 PM. Pt to arrive at 12:00 PM. Nothing to eat or drink four hours prior.  Pt wife Kalman Shan requested to be called back tomorrow and give details of the CT.

## 2022-09-21 ENCOUNTER — Other Ambulatory Visit: Payer: Self-pay

## 2022-09-21 DIAGNOSIS — K6289 Other specified diseases of anus and rectum: Secondary | ICD-10-CM

## 2022-09-21 DIAGNOSIS — C20 Malignant neoplasm of rectum: Secondary | ICD-10-CM

## 2022-09-21 NOTE — Telephone Encounter (Signed)
Left message for pt to call back  °

## 2022-09-21 NOTE — Telephone Encounter (Signed)
Pt wife rose made aware of  Dr. Lyndel Safe recommendations after pt Colonoscopy were ( CT chest, abdo, pelvis with contrast for metastatic workup) CT ordered and scheduled for 09/26/2022 at 2:00 PM at [facility]. At 2:00 PM. Pt to arrive at 12:00 PM. Nothing to eat or drink four hours prior. Address provided  Pt wife Kalman Shan verbalized understanding with all questions answered.

## 2022-09-21 NOTE — Telephone Encounter (Signed)
PT wife is returning call. Requesting call back. Please advise.

## 2022-09-21 NOTE — Telephone Encounter (Signed)
Inbound call from patient spouse returning call. Please advise

## 2022-09-26 ENCOUNTER — Ambulatory Visit (HOSPITAL_BASED_OUTPATIENT_CLINIC_OR_DEPARTMENT_OTHER): Payer: Medicare HMO

## 2022-09-26 ENCOUNTER — Ambulatory Visit (HOSPITAL_BASED_OUTPATIENT_CLINIC_OR_DEPARTMENT_OTHER)
Admission: RE | Admit: 2022-09-26 | Discharge: 2022-09-26 | Disposition: A | Discharge status: Home / Self Care | Payer: Medicare HMO | Source: Ambulatory Visit | Attending: Gastroenterology | Admitting: Gastroenterology

## 2022-09-26 DIAGNOSIS — R634 Abnormal weight loss: Secondary | ICD-10-CM | POA: Diagnosis not present

## 2022-09-26 DIAGNOSIS — K529 Noninfective gastroenteritis and colitis, unspecified: Secondary | ICD-10-CM | POA: Insufficient documentation

## 2022-09-26 DIAGNOSIS — K6289 Other specified diseases of anus and rectum: Secondary | ICD-10-CM | POA: Insufficient documentation

## 2022-09-26 MED ORDER — IOHEXOL 300 MG/ML  SOLN
100.0000 mL | Freq: Once | INTRAMUSCULAR | Status: AC | PRN
  Administered 2022-09-26: 100 mL via INTRAVENOUS

## 2022-09-27 ENCOUNTER — Telehealth: Payer: Self-pay | Admitting: Gastroenterology

## 2022-09-27 NOTE — Telephone Encounter (Signed)
Inbound call from patients wife stating that patient has not had a normal bowel movement since his procedure last week. Patients wife stated he is passing  gas and  and also has clear liquid with what may be blood. Wanted to know if it was normal or if something is wrong. Requesting a call back to discuss. Please advise.

## 2022-09-28 NOTE — Telephone Encounter (Signed)
Left message for pt or pt wife Rose to call  back:

## 2022-09-28 NOTE — Telephone Encounter (Signed)
PT is returning call. Requesting call back.

## 2022-09-28 NOTE — Telephone Encounter (Signed)
Pt wife Kalman Shan  stated that pt has not had a BM since his colonoscopy on 09/19/2022: Eating regular.   Pt still having rectal bleeding and some abdominal pain as before. No nausea, No Temp  3 days ago pt started having episodes of brown mucous in the toilet and episodes of incontinence of brown mucous.   Pt had CT scan yesterday. Wife and pt very concerned.   Recommended Miralax daily since no BM in 9 days.  Please advise.

## 2022-09-28 NOTE — Telephone Encounter (Signed)
Patient's wife is returning [place] call.

## 2022-09-29 NOTE — Telephone Encounter (Signed)
Patients wife requesting a call.

## 2022-09-29 NOTE — Telephone Encounter (Signed)
No obstruction on CT. He should take MiraLAX 17 g p.o. once a day Appointment with Dr. Lewis/[facility] ASAP RG

## 2022-09-30 NOTE — Telephone Encounter (Signed)
PT wife returning call. Please advise.

## 2022-09-30 NOTE — Telephone Encounter (Signed)
Detailed message left on Voice Mail with Dr. Lyndel Safe recommendations: Chart reviewed. Pt  scheduled on 10/05/2022 at 3:00 with [facility] in [place].

## 2022-09-30 NOTE — Telephone Encounter (Signed)
Left message for pt wife Kalman Shan  to call back

## 2022-10-03 NOTE — Telephone Encounter (Signed)
Pt wife Kalman Shan made aware of recent results and Dr. Lyndel Safe recommendations:  No obstruction on CT. He should take MiraLAX 17 g p.o. once a day  Appointment on 10/04/2021 at 3:00 with cancer center: Rose  verbalized understanding with all questions answered.

## 2022-10-03 NOTE — Telephone Encounter (Signed)
Patients wife is returning your call

## 2022-10-03 NOTE — Telephone Encounter (Signed)
Left message for Pt wife Kalman Shan to call back

## 2022-10-05 ENCOUNTER — Inpatient Hospital Stay: Payer: Medicare HMO

## 2022-10-05 ENCOUNTER — Inpatient Hospital Stay: Payer: Medicare HMO | Attending: Oncology | Admitting: Oncology

## 2022-10-05 ENCOUNTER — Encounter: Payer: Self-pay | Admitting: Oncology

## 2022-10-05 VITALS — BP 167/77 | HR 66 | Temp 99.0°F | Resp 12 | Ht 70.0 in | Wt 195.2 lb

## 2022-10-05 DIAGNOSIS — I10 Essential (primary) hypertension: Secondary | ICD-10-CM | POA: Diagnosis not present

## 2022-10-05 DIAGNOSIS — I878 Other specified disorders of veins: Secondary | ICD-10-CM

## 2022-10-05 DIAGNOSIS — I69359 Hemiplegia and hemiparesis following cerebral infarction affecting unspecified side: Secondary | ICD-10-CM | POA: Diagnosis not present

## 2022-10-05 DIAGNOSIS — E119 Type 2 diabetes mellitus without complications: Secondary | ICD-10-CM | POA: Insufficient documentation

## 2022-10-05 DIAGNOSIS — D500 Iron deficiency anemia secondary to blood loss (chronic): Secondary | ICD-10-CM

## 2022-10-05 DIAGNOSIS — R4589 Other symptoms and signs involving emotional state: Secondary | ICD-10-CM | POA: Diagnosis not present

## 2022-10-05 DIAGNOSIS — D649 Anemia, unspecified: Secondary | ICD-10-CM

## 2022-10-05 DIAGNOSIS — I4891 Unspecified atrial fibrillation: Secondary | ICD-10-CM | POA: Diagnosis not present

## 2022-10-05 DIAGNOSIS — C20 Malignant neoplasm of rectum: Secondary | ICD-10-CM

## 2022-10-05 DIAGNOSIS — Z7982 Long term (current) use of aspirin: Secondary | ICD-10-CM | POA: Diagnosis not present

## 2022-10-05 DIAGNOSIS — Z79899 Other long term (current) drug therapy: Secondary | ICD-10-CM | POA: Diagnosis not present

## 2022-10-05 DIAGNOSIS — Z794 Long term (current) use of insulin: Secondary | ICD-10-CM | POA: Diagnosis not present

## 2022-10-05 HISTORY — DX: Anemia, unspecified: D64.9

## 2022-10-05 HISTORY — DX: Malignant neoplasm of rectum: C20

## 2022-10-05 LAB — CBC WITH DIFFERENTIAL/PLATELET
Abs Immature Granulocytes: 0.01 10*3/uL (ref 0.00–0.07)
Basophils Absolute: 0 10*3/uL (ref 0.0–0.1)
Basophils Relative: 0 %
Eosinophils Absolute: 0.2 10*3/uL (ref 0.0–0.5)
Eosinophils Relative: 3 %
HCT: 35.6 % — ABNORMAL LOW (ref 39.0–52.0)
Hemoglobin: 11.2 g/dL — ABNORMAL LOW (ref 13.0–17.0)
Immature Granulocytes: 0 %
Lymphocytes Relative: 30 %
Lymphs Abs: 2.2 10*3/uL (ref 0.7–4.0)
MCH: 25.6 pg — ABNORMAL LOW (ref 26.0–34.0)
MCHC: 31.5 g/dL (ref 30.0–36.0)
MCV: 81.5 fL (ref 80.0–100.0)
Monocytes Absolute: 0.6 10*3/uL (ref 0.1–1.0)
Monocytes Relative: 8 %
Neutro Abs: 4.3 10*3/uL (ref 1.7–7.7)
Neutrophils Relative %: 59 %
Platelets: 196 10*3/uL (ref 150–400)
RBC: 4.37 MIL/uL (ref 4.22–5.81)
RDW: 15.7 % — ABNORMAL HIGH (ref 11.5–15.5)
WBC: 7.4 10*3/uL (ref 4.0–10.5)
nRBC: 0 % (ref 0.0–0.2)
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
## 2022-10-06 ENCOUNTER — Encounter: Payer: Self-pay | Admitting: Oncology

## 2022-10-06 ENCOUNTER — Other Ambulatory Visit: Payer: Self-pay | Admitting: Pharmacist

## 2022-10-06 DIAGNOSIS — D500 Iron deficiency anemia secondary to blood loss (chronic): Secondary | ICD-10-CM | POA: Diagnosis not present

## 2022-10-06 DIAGNOSIS — Z794 Long term (current) use of insulin: Secondary | ICD-10-CM | POA: Diagnosis not present

## 2022-10-06 DIAGNOSIS — Z7982 Long term (current) use of aspirin: Secondary | ICD-10-CM | POA: Diagnosis not present

## 2022-10-06 DIAGNOSIS — C20 Malignant neoplasm of rectum: Secondary | ICD-10-CM | POA: Diagnosis not present

## 2022-10-06 DIAGNOSIS — Z79899 Other long term (current) drug therapy: Secondary | ICD-10-CM | POA: Diagnosis not present

## 2022-10-06 DIAGNOSIS — E119 Type 2 diabetes mellitus without complications: Secondary | ICD-10-CM | POA: Diagnosis not present

## 2022-10-06 DIAGNOSIS — I10 Essential (primary) hypertension: Secondary | ICD-10-CM | POA: Diagnosis not present

## 2022-10-06 DIAGNOSIS — I69359 Hemiplegia and hemiparesis following cerebral infarction affecting unspecified side: Secondary | ICD-10-CM | POA: Diagnosis not present

## 2022-10-06 DIAGNOSIS — I4891 Unspecified atrial fibrillation: Secondary | ICD-10-CM | POA: Diagnosis not present

## 2022-10-06 LAB — FERRITIN: Ferritin: 15 ng/mL — ABNORMAL LOW (ref 24–336)

## 2022-10-06 LAB — CMP (CANCER CENTER ONLY)
ALT: 13 U/L (ref 0–44)
AST: 18 U/L (ref 15–41)
Albumin: 4.1 g/dL (ref 3.5–5.0)
Alkaline Phosphatase: 64 U/L (ref 38–126)
Anion gap: 10 (ref 5–15)
BUN: 17 mg/dL (ref 8–23)
CO2: 25 mmol/L (ref 22–32)
Calcium: 8.8 mg/dL — ABNORMAL LOW (ref 8.9–10.3)
Chloride: 102 mmol/L (ref 98–111)
Creatinine: 0.77 mg/dL (ref 0.61–1.24)
GFR, Estimated: >60 mL/min (ref >60)
Glucose, Bld: 100 mg/dL — ABNORMAL HIGH (ref 70–99)
Potassium: 4.1 mmol/L (ref 3.5–5.1)
Sodium: 137 mmol/L (ref 135–145)
Total Bilirubin: 0.9 mg/dL (ref 0.3–1.2)
Total Protein: 6.7 g/dL (ref 6.5–8.1)

## 2022-10-06 LAB — FOLATE: Folate: 12.2 ng/mL (ref >5.9)

## 2022-10-06 LAB — VITAMIN B12: Vitamin B-12: 215 pg/mL (ref 180–914)

## 2022-10-06 NOTE — Progress Notes (Signed)
..  [facility] orders changed to venofer due to insurance plan preference.  Message to scheduling has been sent.  

## 2022-10-06 NOTE — Addendum Note (Signed)
Addended by: Juanetta Beets on: 10/06/2022 11:56 AM   Modules accepted: Orders

## 2022-10-08 LAB — CEA: CEA: 84.2 ng/mL — ABNORMAL HIGH (ref 0.0–4.7)

## 2022-10-11 ENCOUNTER — Encounter: Payer: Self-pay | Admitting: Oncology

## 2022-10-11 DIAGNOSIS — R4589 Other symptoms and signs involving emotional state: Secondary | ICD-10-CM

## 2022-10-11 DIAGNOSIS — C20 Malignant neoplasm of rectum: Secondary | ICD-10-CM | POA: Diagnosis not present

## 2022-10-11 DIAGNOSIS — I878 Other specified disorders of veins: Secondary | ICD-10-CM

## 2022-10-11 HISTORY — DX: Other specified disorders of veins: I87.8

## 2022-10-11 HISTORY — DX: Other symptoms and signs involving emotional state: R45.89

## 2022-10-11 MED FILL — Iron Sucrose Inj 20 MG/ML (Fe Equiv): INTRAVENOUS | Qty: 10 | Status: AC

## 2022-10-12 ENCOUNTER — Inpatient Hospital Stay: Payer: Medicare HMO

## 2022-10-12 VITALS — BP 153/79 | HR 35 | Temp 98.1°F | Resp 18 | Ht 70.0 in | Wt 195.0 lb

## 2022-10-12 DIAGNOSIS — R531 Weakness: Secondary | ICD-10-CM | POA: Diagnosis not present

## 2022-10-12 DIAGNOSIS — D500 Iron deficiency anemia secondary to blood loss (chronic): Secondary | ICD-10-CM

## 2022-10-12 DIAGNOSIS — I498 Other specified cardiac arrhythmias: Secondary | ICD-10-CM | POA: Diagnosis not present

## 2022-10-12 DIAGNOSIS — I10 Essential (primary) hypertension: Secondary | ICD-10-CM | POA: Diagnosis not present

## 2022-10-12 DIAGNOSIS — R001 Bradycardia, unspecified: Secondary | ICD-10-CM | POA: Diagnosis not present

## 2022-10-12 DIAGNOSIS — I491 Atrial premature depolarization: Secondary | ICD-10-CM | POA: Diagnosis not present

## 2022-10-12 DIAGNOSIS — E119 Type 2 diabetes mellitus without complications: Secondary | ICD-10-CM | POA: Diagnosis not present

## 2022-10-12 MED ORDER — LORATADINE 10 MG PO TABS
10.0000 mg | ORAL_TABLET | Freq: Once | ORAL | Status: DC
  Filled 2022-10-12: qty 1

## 2022-10-12 MED ORDER — SODIUM CHLORIDE 0.9 % IV SOLN: Freq: Once | INTRAVENOUS | Status: DC

## 2022-10-12 MED ORDER — SODIUM CHLORIDE 0.9 % IV SOLN
200.0000 mg | Freq: Once | INTRAVENOUS | Status: DC
  Filled 2022-10-12: qty 10

## 2022-10-12 MED ORDER — ACETAMINOPHEN 325 MG PO TABS
650.0000 mg | ORAL_TABLET | Freq: Once | ORAL | Status: DC
  Filled 2022-10-12: qty 2

## 2022-10-12 NOTE — Progress Notes (Signed)
[place], pharm calling EMS, patients wife and patient agree to be evaluated by EMS. EMS arrived and patients heart rate was back into the 60s but he was still feeling dizzy. Agreed to go to [place] taxi service for patients wife to be transported. Wife states the patient has been urinating blood more frequently.

## 2022-10-13 ENCOUNTER — Telehealth: Payer: Self-pay

## 2022-10-13 ENCOUNTER — Other Ambulatory Visit: Payer: Self-pay

## 2022-10-13 ENCOUNTER — Encounter: Payer: Self-pay | Admitting: Oncology

## 2022-10-13 DIAGNOSIS — K6289 Other specified diseases of anus and rectum: Secondary | ICD-10-CM

## 2022-10-13 DIAGNOSIS — C20 Malignant neoplasm of rectum: Secondary | ICD-10-CM

## 2022-10-13 MED FILL — Iron Sucrose Inj 20 MG/ML (Fe Equiv): INTRAVENOUS | Qty: 10 | Status: AC

## 2022-10-13 NOTE — Telephone Encounter (Signed)
1130 PATIENT'S WIFE CALLED BACK AND CONFIRMED THAT THE PATIENT WAS OK. SHE STATED THAT THEY WOULD BE HERE FOR HIS VENOFER ON THIS [name] 10/14/2022.

## 2022-10-13 NOTE — Telephone Encounter (Signed)
Please assist with phone number that was provided. This appears to be the wrong number:

## 2022-10-13 NOTE — Telephone Encounter (Signed)
Spoke with Rosann Auerbach from [facility] who stated that Dr. Vic Ripper is requesting an MSI testing be done due to no normal tissue available   MSI testing is to be drawn in a purple top. Hebert Soho NP to pick up lab and transport to [facility]  Order for lab placed in [place]. Secure Chat sent to [facility] NP to coordinate  lab draw with pick up and delivery.  Pt wife Kalman Shan  contacted and made aware of the request for the lab to be drawn.  Rose stated that she does not think that they will be able to come by the lab anytime soon, Pt is exhausted, was in ED yesterday, Iron infusions Tomorrow ( Friday) , Monday, Wednesday, Friday and the Following Monday at the [facility] infusion center. Rose stated that she can't drive because of a bad eye.  Left Message for [facility] Nurse to call back to discuss if lab can be drawn there.

## 2022-10-13 NOTE — Telephone Encounter (Signed)
Rosann Auerbach from [facility] called referring to a recent order, needing further direction. Phone number is [phone] ext. [id number]

## 2022-10-13 NOTE — Telephone Encounter (Signed)
Discussed in detail  with Cherylann Parr CMA at  [facility] the request for the lab MSI  ( purple top) to be drawn there and to be taking to [facility] to be processed.  Estill Bamberg stated that they will take care of it. Left detailed message for pt wife rose notifying them that the cancer center is going to help assist with the lab draw.

## 2022-10-13 NOTE — Telephone Encounter (Signed)
Pax LEFT A MESSAGE. THE PATIENT DID NOT RECIEVE HIS FIRST VENOFER YESTERDAY BECAUSE HIS HEART RATE WAS AT 18. I CALLED CHECKING ON THE PATIENT TO MAKE SURE HE WAS OK. THE PATIENT IS SCHEDULED FOR VENOFER NEXT WEEK ALSO.

## 2022-10-14 ENCOUNTER — Encounter: Payer: Self-pay | Admitting: Oncology

## 2022-10-14 ENCOUNTER — Inpatient Hospital Stay: Payer: Medicare HMO

## 2022-10-14 ENCOUNTER — Other Ambulatory Visit: Payer: Self-pay

## 2022-10-14 VITALS — BP 155/81 | HR 81 | Temp 98.8°F | Resp 18

## 2022-10-14 DIAGNOSIS — I69359 Hemiplegia and hemiparesis following cerebral infarction affecting unspecified side: Secondary | ICD-10-CM | POA: Diagnosis not present

## 2022-10-14 DIAGNOSIS — D500 Iron deficiency anemia secondary to blood loss (chronic): Secondary | ICD-10-CM

## 2022-10-14 DIAGNOSIS — C20 Malignant neoplasm of rectum: Secondary | ICD-10-CM

## 2022-10-14 DIAGNOSIS — E119 Type 2 diabetes mellitus without complications: Secondary | ICD-10-CM | POA: Diagnosis not present

## 2022-10-14 DIAGNOSIS — Z79899 Other long term (current) drug therapy: Secondary | ICD-10-CM | POA: Diagnosis not present

## 2022-10-14 DIAGNOSIS — Z794 Long term (current) use of insulin: Secondary | ICD-10-CM | POA: Diagnosis not present

## 2022-10-14 DIAGNOSIS — Z7982 Long term (current) use of aspirin: Secondary | ICD-10-CM | POA: Diagnosis not present

## 2022-10-14 DIAGNOSIS — I4891 Unspecified atrial fibrillation: Secondary | ICD-10-CM | POA: Diagnosis not present

## 2022-10-14 DIAGNOSIS — I10 Essential (primary) hypertension: Secondary | ICD-10-CM | POA: Diagnosis not present

## 2022-10-14 MED ORDER — SODIUM CHLORIDE 0.9 % IV SOLN
200.0000 mg | Freq: Once | INTRAVENOUS | Status: AC
  Administered 2022-10-14: 200 mg via INTRAVENOUS
  Filled 2022-10-14: qty 200

## 2022-10-14 MED ORDER — SODIUM CHLORIDE 0.9 % IV SOLN: Freq: Once | INTRAVENOUS | Status: AC

## 2022-10-14 MED ORDER — ACETAMINOPHEN 325 MG PO TABS
650.0000 mg | ORAL_TABLET | Freq: Once | ORAL | Status: AC
  Administered 2022-10-14: 650 mg via ORAL
  Filled 2022-10-14: qty 2

## 2022-10-14 MED ORDER — LORATADINE 10 MG PO TABS
10.0000 mg | ORAL_TABLET | Freq: Once | ORAL | Status: AC
  Administered 2022-10-14: 10 mg via ORAL
  Filled 2022-10-14: qty 1

## 2022-10-14 NOTE — Patient Instructions (Signed)

## 2022-10-14 NOTE — Addendum Note (Signed)
Addended by: Juanetta Beets on: 10/14/2022 03:19 PM   Modules accepted: Orders

## 2022-10-17 ENCOUNTER — Telehealth: Payer: Self-pay | Admitting: *Deleted

## 2022-10-17 ENCOUNTER — Inpatient Hospital Stay: Payer: Medicare HMO

## 2022-10-17 ENCOUNTER — Encounter: Payer: Self-pay | Admitting: Oncology

## 2022-10-17 ENCOUNTER — Telehealth: Payer: Self-pay | Admitting: Gastroenterology

## 2022-10-17 VITALS — BP 132/79 | HR 55 | Temp 98.7°F | Resp 20 | Ht 70.0 in | Wt 198.0 lb

## 2022-10-17 DIAGNOSIS — Z7982 Long term (current) use of aspirin: Secondary | ICD-10-CM | POA: Diagnosis not present

## 2022-10-17 DIAGNOSIS — D500 Iron deficiency anemia secondary to blood loss (chronic): Secondary | ICD-10-CM

## 2022-10-17 DIAGNOSIS — I10 Essential (primary) hypertension: Secondary | ICD-10-CM | POA: Diagnosis not present

## 2022-10-17 DIAGNOSIS — Z79899 Other long term (current) drug therapy: Secondary | ICD-10-CM | POA: Diagnosis not present

## 2022-10-17 DIAGNOSIS — I4891 Unspecified atrial fibrillation: Secondary | ICD-10-CM | POA: Diagnosis not present

## 2022-10-17 DIAGNOSIS — I69359 Hemiplegia and hemiparesis following cerebral infarction affecting unspecified side: Secondary | ICD-10-CM | POA: Diagnosis not present

## 2022-10-17 DIAGNOSIS — C20 Malignant neoplasm of rectum: Secondary | ICD-10-CM | POA: Diagnosis not present

## 2022-10-17 DIAGNOSIS — E119 Type 2 diabetes mellitus without complications: Secondary | ICD-10-CM | POA: Diagnosis not present

## 2022-10-17 DIAGNOSIS — Z794 Long term (current) use of insulin: Secondary | ICD-10-CM | POA: Diagnosis not present

## 2022-10-17 MED ORDER — SODIUM CHLORIDE 0.9 % IV SOLN
200.0000 mg | Freq: Once | INTRAVENOUS | Status: AC
  Administered 2022-10-17: 200 mg via INTRAVENOUS
  Filled 2022-10-17: qty 200

## 2022-10-17 MED ORDER — LORATADINE 10 MG PO TABS: 10.0000 mg | ORAL_TABLET | Freq: Once | ORAL | Status: DC

## 2022-10-17 MED ORDER — SODIUM CHLORIDE 0.9 % IV SOLN: Freq: Once | INTRAVENOUS | Status: AC

## 2022-10-17 MED ORDER — ACETAMINOPHEN 325 MG PO TABS: 650.0000 mg | ORAL_TABLET | Freq: Once | ORAL | Status: DC

## 2022-10-17 NOTE — Telephone Encounter (Signed)
        Patient  visited [facility] on 10/12/2022  for treatment    Telephone encounter attempt :  1st  A HIPAA compliant voice message was left requesting a return call.  Instructed patient to call back at [phone].  [place] [phone] [address] , [place] [address] Email : [name]

## 2022-10-17 NOTE — Telephone Encounter (Signed)
Inbound call from patient's wife, is requesting to speak to Remo Lipps in regards to labs that were drawn on Friday. She states they were supposed to be expedited to a [facility] location and she would like to know if they were received or an update on them. She stated it was okay to leave detailed message.

## 2022-10-17 NOTE — Patient Instructions (Signed)

## 2022-10-18 ENCOUNTER — Ambulatory Visit: Payer: Medicare HMO

## 2022-10-18 DIAGNOSIS — C20 Malignant neoplasm of rectum: Secondary | ICD-10-CM | POA: Diagnosis not present

## 2022-10-18 MED FILL — Iron Sucrose Inj 20 MG/ML (Fe Equiv): INTRAVENOUS | Qty: 10 | Status: AC

## 2022-10-18 NOTE — Telephone Encounter (Signed)
Follow up was made to [facility] at [facility] Pathology to ensure that blood sample was delivered from the cancer center. Rosann Auerbach stated that they have not receive the blood sample as of yet.  Will continue to follow.

## 2022-10-18 NOTE — Telephone Encounter (Signed)
Robert Brewer CMA at  [facility]  was contacted to follow up. Robert Brewer stated that she received a call from [facility] lab stating that they have the blood and is unsure what to do with it. [facility] contacted and given the number to [facility]ology with Robert Brewer as the POC to ensure that the lab is delivered.

## 2022-10-18 NOTE — Telephone Encounter (Signed)
Pt wife Kalman Shan  made aware that the blood test was taken  to [facility].  Rose  verbalized understanding with all questions answered.

## 2022-10-18 NOTE — Telephone Encounter (Signed)
Rosann Auerbach at [facility]ology was  contacted to follow up on blood draw that was done. Rosann Auerbach stated that she did receive a call from [facility] and that the blood was taken to [facility] and they now have the blood.

## 2022-10-19 ENCOUNTER — Encounter: Payer: Self-pay | Admitting: Nurse Practitioner

## 2022-10-19 ENCOUNTER — Inpatient Hospital Stay: Payer: Medicare HMO

## 2022-10-19 ENCOUNTER — Telehealth: Payer: Self-pay | Admitting: *Deleted

## 2022-10-19 ENCOUNTER — Telehealth: Payer: Self-pay

## 2022-10-19 ENCOUNTER — Other Ambulatory Visit: Payer: Self-pay

## 2022-10-19 ENCOUNTER — Ambulatory Visit: Payer: Medicare HMO | Admitting: Oncology

## 2022-10-19 ENCOUNTER — Other Ambulatory Visit: Payer: Medicare HMO

## 2022-10-19 VITALS — BP 145/69 | HR 59 | Temp 98.1°F | Resp 18 | Ht 70.0 in | Wt 195.1 lb

## 2022-10-19 DIAGNOSIS — C20 Malignant neoplasm of rectum: Secondary | ICD-10-CM | POA: Diagnosis not present

## 2022-10-19 DIAGNOSIS — E119 Type 2 diabetes mellitus without complications: Secondary | ICD-10-CM | POA: Diagnosis not present

## 2022-10-19 DIAGNOSIS — I69359 Hemiplegia and hemiparesis following cerebral infarction affecting unspecified side: Secondary | ICD-10-CM | POA: Diagnosis not present

## 2022-10-19 DIAGNOSIS — I10 Essential (primary) hypertension: Secondary | ICD-10-CM | POA: Diagnosis not present

## 2022-10-19 DIAGNOSIS — Z7982 Long term (current) use of aspirin: Secondary | ICD-10-CM | POA: Diagnosis not present

## 2022-10-19 DIAGNOSIS — D500 Iron deficiency anemia secondary to blood loss (chronic): Secondary | ICD-10-CM | POA: Diagnosis not present

## 2022-10-19 DIAGNOSIS — Z79899 Other long term (current) drug therapy: Secondary | ICD-10-CM | POA: Diagnosis not present

## 2022-10-19 DIAGNOSIS — I4891 Unspecified atrial fibrillation: Secondary | ICD-10-CM | POA: Diagnosis not present

## 2022-10-19 DIAGNOSIS — Z794 Long term (current) use of insulin: Secondary | ICD-10-CM | POA: Diagnosis not present

## 2022-10-19 MED ORDER — LORATADINE 10 MG PO TABS: 10.0000 mg | ORAL_TABLET | Freq: Once | ORAL | Status: DC

## 2022-10-19 MED ORDER — SODIUM CHLORIDE 0.9 % IV SOLN
200.0000 mg | Freq: Once | INTRAVENOUS | Status: AC
  Administered 2022-10-19: 200 mg via INTRAVENOUS
  Filled 2022-10-19: qty 200

## 2022-10-19 MED ORDER — SODIUM CHLORIDE 0.9 % IV SOLN: Freq: Once | INTRAVENOUS | Status: AC

## 2022-10-19 MED ORDER — ACETAMINOPHEN 325 MG PO TABS: 650.0000 mg | ORAL_TABLET | Freq: Once | ORAL | Status: DC

## 2022-10-19 NOTE — Telephone Encounter (Signed)
[facility] to follow up on referral to Colorectal Surgeon and have Port placed. Cat stated that they were working on it, but needed me to send a referral order from [facility], which as been entered and faxed to her attention.   I have since been contacted by Cat, who has advised me that at this time the patient's wife is wishing to hold on scheduling due to wanting to get through all his currently schedule appointments and imaging studies.

## 2022-10-19 NOTE — Progress Notes (Signed)
1514 pt d/c stable did not want to wait the 30 mins

## 2022-10-19 NOTE — Telephone Encounter (Signed)
        Patient  visited [facility] on 10/12/2022  for treatment   Telephone encounter attempt :  2nd  A HIPAA compliant voice message was left requesting a return call.  Instructed patient to call back at [phone].  [facility] [phone] [address] , [place] [address] Email : [name]

## 2022-10-19 NOTE — Patient Instructions (Signed)

## 2022-10-20 ENCOUNTER — Telehealth: Payer: Self-pay | Admitting: *Deleted

## 2022-10-20 ENCOUNTER — Other Ambulatory Visit: Payer: Self-pay | Admitting: Nurse Practitioner

## 2022-10-20 DIAGNOSIS — M25551 Pain in right hip: Secondary | ICD-10-CM

## 2022-10-20 MED FILL — Iron Sucrose Inj 20 MG/ML (Fe Equiv): INTRAVENOUS | Qty: 10 | Status: AC

## 2022-10-20 NOTE — Telephone Encounter (Signed)
        Patient  visited [facility] on 10/12/2022  for treatment   Telephone encounter attempt :  2nd  A HIPAA compliant voice message was left requesting a return call.  Instructed patient to call back at [phone].  Robert Brewer [phone] [address] , [place] [address] Email : [name]

## 2022-10-21 ENCOUNTER — Inpatient Hospital Stay: Payer: Medicare HMO

## 2022-10-21 VITALS — BP 133/74 | HR 74 | Temp 98.4°F | Resp 18 | Ht 70.0 in | Wt 196.0 lb

## 2022-10-21 DIAGNOSIS — C20 Malignant neoplasm of rectum: Secondary | ICD-10-CM | POA: Diagnosis not present

## 2022-10-21 DIAGNOSIS — I10 Essential (primary) hypertension: Secondary | ICD-10-CM | POA: Diagnosis not present

## 2022-10-21 DIAGNOSIS — E119 Type 2 diabetes mellitus without complications: Secondary | ICD-10-CM | POA: Diagnosis not present

## 2022-10-21 DIAGNOSIS — D500 Iron deficiency anemia secondary to blood loss (chronic): Secondary | ICD-10-CM | POA: Diagnosis not present

## 2022-10-21 DIAGNOSIS — Z7982 Long term (current) use of aspirin: Secondary | ICD-10-CM | POA: Diagnosis not present

## 2022-10-21 DIAGNOSIS — I69359 Hemiplegia and hemiparesis following cerebral infarction affecting unspecified side: Secondary | ICD-10-CM | POA: Diagnosis not present

## 2022-10-21 DIAGNOSIS — Z794 Long term (current) use of insulin: Secondary | ICD-10-CM | POA: Diagnosis not present

## 2022-10-21 DIAGNOSIS — I4891 Unspecified atrial fibrillation: Secondary | ICD-10-CM | POA: Diagnosis not present

## 2022-10-21 DIAGNOSIS — Z79899 Other long term (current) drug therapy: Secondary | ICD-10-CM | POA: Diagnosis not present

## 2022-10-21 MED ORDER — SODIUM CHLORIDE 0.9 % IV SOLN
200.0000 mg | Freq: Once | INTRAVENOUS | Status: AC
  Administered 2022-10-21: 200 mg via INTRAVENOUS
  Filled 2022-10-21: qty 200

## 2022-10-21 MED ORDER — ACETAMINOPHEN 325 MG PO TABS: 650.0000 mg | ORAL_TABLET | Freq: Once | ORAL | Status: DC

## 2022-10-21 MED ORDER — SODIUM CHLORIDE 0.9 % IV SOLN: Freq: Once | INTRAVENOUS | Status: AC

## 2022-10-21 MED ORDER — LORATADINE 10 MG PO TABS: 10.0000 mg | ORAL_TABLET | Freq: Once | ORAL | Status: DC

## 2022-10-21 NOTE — Progress Notes (Signed)
Pt declined to stay the 30 mins wait time, d/c stable

## 2022-10-21 NOTE — Patient Instructions (Signed)

## 2022-10-24 ENCOUNTER — Encounter: Payer: Self-pay | Admitting: Oncology

## 2022-10-24 ENCOUNTER — Ambulatory Visit: Payer: Medicare HMO

## 2022-10-24 MED FILL — Iron Sucrose Inj 20 MG/ML (Fe Equiv): INTRAVENOUS | Qty: 10 | Status: AC

## 2022-10-24 NOTE — Addendum Note (Signed)
Addended by: Juanetta Beets on: 10/24/2022 10:55 AM   Modules accepted: Orders

## 2022-10-25 ENCOUNTER — Inpatient Hospital Stay: Payer: Medicare HMO | Attending: Oncology

## 2022-10-25 VITALS — BP 144/78 | HR 75 | Temp 98.6°F | Resp 18

## 2022-10-25 DIAGNOSIS — E119 Type 2 diabetes mellitus without complications: Secondary | ICD-10-CM | POA: Diagnosis not present

## 2022-10-25 DIAGNOSIS — Z7982 Long term (current) use of aspirin: Secondary | ICD-10-CM | POA: Diagnosis not present

## 2022-10-25 DIAGNOSIS — Z79899 Other long term (current) drug therapy: Secondary | ICD-10-CM | POA: Diagnosis not present

## 2022-10-25 DIAGNOSIS — C20 Malignant neoplasm of rectum: Secondary | ICD-10-CM | POA: Diagnosis not present

## 2022-10-25 DIAGNOSIS — D500 Iron deficiency anemia secondary to blood loss (chronic): Secondary | ICD-10-CM | POA: Insufficient documentation

## 2022-10-25 DIAGNOSIS — Z794 Long term (current) use of insulin: Secondary | ICD-10-CM | POA: Diagnosis not present

## 2022-10-25 MED ORDER — ACETAMINOPHEN 325 MG PO TABS: 650.0000 mg | ORAL_TABLET | Freq: Once | ORAL | Status: DC

## 2022-10-25 MED ORDER — SODIUM CHLORIDE 0.9 % IV SOLN: Freq: Once | INTRAVENOUS | Status: AC

## 2022-10-25 MED ORDER — LORATADINE 10 MG PO TABS: 10.0000 mg | ORAL_TABLET | Freq: Once | ORAL | Status: DC

## 2022-10-25 MED ORDER — SODIUM CHLORIDE 0.9 % IV SOLN
200.0000 mg | Freq: Once | INTRAVENOUS | Status: AC
  Administered 2022-10-25: 200 mg via INTRAVENOUS
  Filled 2022-10-25: qty 200

## 2022-10-25 NOTE — Progress Notes (Signed)
Pt declined to stay the 30 min wait time, vitals stable at d/c

## 2022-10-25 NOTE — Patient Instructions (Signed)

## 2022-10-27 ENCOUNTER — Ambulatory Visit (HOSPITAL_COMMUNITY)
Admission: RE | Admit: 2022-10-27 | Discharge: 2022-10-27 | Disposition: A | Discharge status: Home / Self Care | Payer: Medicare HMO | Source: Ambulatory Visit | Attending: Oncology | Admitting: Oncology

## 2022-10-27 ENCOUNTER — Other Ambulatory Visit: Payer: Self-pay | Admitting: Nurse Practitioner

## 2022-10-27 DIAGNOSIS — C20 Malignant neoplasm of rectum: Secondary | ICD-10-CM | POA: Insufficient documentation

## 2022-10-27 DIAGNOSIS — D500 Iron deficiency anemia secondary to blood loss (chronic): Secondary | ICD-10-CM | POA: Insufficient documentation

## 2022-10-27 DIAGNOSIS — R197 Diarrhea, unspecified: Secondary | ICD-10-CM

## 2022-10-27 LAB — GLUCOSE, CAPILLARY: Glucose-Capillary: 85 mg/dL (ref 70–99)

## 2022-10-27 MED ORDER — CIPROFLOXACIN HCL 250 MG PO TABS
250.0000 mg | ORAL_TABLET | Freq: Two times a day (BID) | ORAL | 0 refills | Status: DC
Start: 2022-10-27 — End: 2023-11-16

## 2022-10-27 MED ORDER — FLUDEOXYGLUCOSE F - 18 (FDG) INJECTION
9.9000 | Freq: Once | INTRAVENOUS | Status: AC | PRN
  Administered 2022-10-27: 9.9 via INTRAVENOUS

## 2022-10-28 ENCOUNTER — Ambulatory Visit: Payer: Medicare HMO

## 2022-11-01 ENCOUNTER — Ambulatory Visit: Payer: Medicare HMO | Admitting: Internal Medicine

## 2022-11-03 ENCOUNTER — Other Ambulatory Visit: Payer: Self-pay | Admitting: Oncology

## 2022-11-03 ENCOUNTER — Inpatient Hospital Stay: Payer: Medicare HMO

## 2022-11-03 ENCOUNTER — Inpatient Hospital Stay: Payer: Medicare HMO | Admitting: Oncology

## 2022-11-03 ENCOUNTER — Encounter: Payer: Self-pay | Admitting: Oncology

## 2022-11-03 ENCOUNTER — Telehealth: Payer: Self-pay | Admitting: Oncology

## 2022-11-03 VITALS — BP 152/74 | HR 40 | Temp 99.1°F | Resp 16 | Ht 70.0 in | Wt 194.7 lb

## 2022-11-03 DIAGNOSIS — R4589 Other symptoms and signs involving emotional state: Secondary | ICD-10-CM | POA: Diagnosis not present

## 2022-11-03 DIAGNOSIS — C20 Malignant neoplasm of rectum: Secondary | ICD-10-CM | POA: Diagnosis not present

## 2022-11-03 DIAGNOSIS — I878 Other specified disorders of veins: Secondary | ICD-10-CM | POA: Diagnosis not present

## 2022-11-03 DIAGNOSIS — Z91199 Patient's noncompliance with other medical treatment and regimen due to unspecified reason: Secondary | ICD-10-CM | POA: Diagnosis not present

## 2022-11-03 NOTE — Telephone Encounter (Signed)
Pt wishes to have spouse call so that she can schedule his next appts.Marland Kitchen   LOS for DOS 11/03/22: Follow-up disposition: Return in about 2 weeks (around 11/17/2022) for physician, labs, injection.  Check out comments: Please refer to surgery for port placement Please refer for chemotherapy teaching and scheduling.   Please also refer to colorectal surgery.

## 2022-11-03 NOTE — Addendum Note (Signed)
Addended by: Leatha Gilding on: 11/03/2022 09:21 AM   Modules accepted: Orders

## 2022-11-03 NOTE — Progress Notes (Signed)
START ON PATHWAY REGIMEN - Colorectal     A cycle is every 14 days:     Oxaliplatin      Leucovorin      Fluorouracil      Fluorouracil   **Always confirm dose/schedule in your pharmacy ordering system**  Patient Characteristics: Distant Metastases, Resectable, Neoadjuvant Therapy Planned Tumor Location: Rectal Therapeutic Status: Distant Metastases  Intent of Therapy: Curative Intent, Discussed with Patient 
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
## 2022-11-04 ENCOUNTER — Other Ambulatory Visit: Payer: Self-pay

## 2022-11-04 ENCOUNTER — Telehealth: Payer: Self-pay | Admitting: Oncology

## 2022-11-04 NOTE — Telephone Encounter (Signed)
Contacted pt to schedule an appt. Unable to reach via phone, voicemail was left.         Follow-Up Information  Follow-up disposition: Return in about 2 weeks (around 11/17/2022) for physician, labs, injection.  Check out comments: Please refer to surgery for port placement Please refer for chemotherapy teaching and scheduling.   Please also refer to colorectal surgery.           

## 2022-11-07 ENCOUNTER — Other Ambulatory Visit: Payer: Self-pay | Admitting: Pharmacist

## 2022-11-07 ENCOUNTER — Telehealth: Payer: Self-pay | Admitting: Oncology

## 2022-11-07 NOTE — Telephone Encounter (Signed)
Contacted pt to schedule an appt. Unable to reach via phone, voicemail was left.         Follow-Up Information  Follow-up disposition: Return in about 2 weeks (around 11/17/2022) for physician, labs, injection.  Check out comments: Please refer to surgery for port placement Please refer for chemotherapy teaching and scheduling.   Please also refer to colorectal surgery.

## 2022-11-07 NOTE — Telephone Encounter (Signed)
I have spoken to the wife and notified her of all that was needing to be scheduled for Robert Brewer within these next few weeks. Per wife, things will be delayed because she is scheduled for eye surgery this coming Wednesday and is not cleared to drive at this time. I did explain that they should be receiving a call from two different specialists to schedule appts (GI and Surgeon) but that one his port has been scheduled for placement they could contact our office so we can arrange chemo educaton, infusion(s) and follow-up appts.

## 2022-11-08 DIAGNOSIS — C20 Malignant neoplasm of rectum: Secondary | ICD-10-CM | POA: Diagnosis not present

## 2022-11-10 ENCOUNTER — Encounter: Payer: Self-pay | Admitting: Oncology

## 2022-11-11 ENCOUNTER — Telehealth: Payer: Self-pay | Admitting: Oncology

## 2022-11-11 NOTE — Telephone Encounter (Signed)
11/11/22 LVM upcoming appt on 11/16/22@4pm 

## 2022-11-15 ENCOUNTER — Telehealth: Payer: Self-pay | Admitting: Oncology

## 2022-11-15 NOTE — Telephone Encounter (Signed)
11/15/22 Patient cancelled appts and did not want to reschedule.Does not want any treatment

## 2022-11-16 ENCOUNTER — Ambulatory Visit: Payer: Medicare HMO | Admitting: Oncology

## 2022-11-30 ENCOUNTER — Telehealth: Payer: Self-pay

## 2022-11-30 NOTE — Telephone Encounter (Signed)
Called patient to schedule Medicare Annual Wellness Visit (AWV). No voicemail available to leave a message.   Please schedule an appointment at any time On Annual Wellness Visit Schedule.     Agnes Lawrence, CMA (AAMA)  [facility]- AWV Program [phone]

## 2022-12-13 ENCOUNTER — Ambulatory Visit: Payer: Medicare HMO | Admitting: Internal Medicine

## 2022-12-21 DIAGNOSIS — C20 Malignant neoplasm of rectum: Secondary | ICD-10-CM | POA: Diagnosis not present

## 2023-01-02 DIAGNOSIS — C20 Malignant neoplasm of rectum: Secondary | ICD-10-CM | POA: Diagnosis not present

## 2023-01-05 ENCOUNTER — Other Ambulatory Visit: Payer: Self-pay | Admitting: Internal Medicine

## 2023-01-05 DIAGNOSIS — C20 Malignant neoplasm of rectum: Secondary | ICD-10-CM | POA: Diagnosis not present

## 2023-01-12 DIAGNOSIS — M25561 Pain in right knee: Secondary | ICD-10-CM | POA: Diagnosis not present

## 2023-01-12 DIAGNOSIS — M1711 Unilateral primary osteoarthritis, right knee: Secondary | ICD-10-CM | POA: Diagnosis not present

## 2023-01-24 DIAGNOSIS — C20 Malignant neoplasm of rectum: Secondary | ICD-10-CM | POA: Diagnosis not present

## 2023-01-30 DIAGNOSIS — C20 Malignant neoplasm of rectum: Secondary | ICD-10-CM | POA: Diagnosis not present

## 2023-01-30 DIAGNOSIS — Z79899 Other long term (current) drug therapy: Secondary | ICD-10-CM | POA: Diagnosis not present

## 2023-01-31 DIAGNOSIS — C20 Malignant neoplasm of rectum: Secondary | ICD-10-CM | POA: Diagnosis not present

## 2023-02-08 DIAGNOSIS — C20 Malignant neoplasm of rectum: Secondary | ICD-10-CM | POA: Diagnosis not present

## 2023-02-08 DIAGNOSIS — R6889 Other general symptoms and signs: Secondary | ICD-10-CM | POA: Diagnosis not present

## 2023-02-09 DIAGNOSIS — R6889 Other general symptoms and signs: Secondary | ICD-10-CM | POA: Diagnosis not present

## 2023-02-09 DIAGNOSIS — Z5111 Encounter for antineoplastic chemotherapy: Secondary | ICD-10-CM | POA: Diagnosis not present

## 2023-02-09 DIAGNOSIS — C20 Malignant neoplasm of rectum: Secondary | ICD-10-CM | POA: Diagnosis not present

## 2023-02-23 DIAGNOSIS — Z5111 Encounter for antineoplastic chemotherapy: Secondary | ICD-10-CM | POA: Diagnosis not present

## 2023-02-23 DIAGNOSIS — R6889 Other general symptoms and signs: Secondary | ICD-10-CM | POA: Diagnosis not present

## 2023-02-23 DIAGNOSIS — T451X5A Adverse effect of antineoplastic and immunosuppressive drugs, initial encounter: Secondary | ICD-10-CM | POA: Diagnosis not present

## 2023-02-23 DIAGNOSIS — R21 Rash and other nonspecific skin eruption: Secondary | ICD-10-CM | POA: Diagnosis not present

## 2023-02-23 DIAGNOSIS — C20 Malignant neoplasm of rectum: Secondary | ICD-10-CM | POA: Diagnosis not present

## 2023-03-09 DIAGNOSIS — R21 Rash and other nonspecific skin eruption: Secondary | ICD-10-CM | POA: Diagnosis not present

## 2023-03-09 DIAGNOSIS — Z5111 Encounter for antineoplastic chemotherapy: Secondary | ICD-10-CM | POA: Diagnosis not present

## 2023-03-09 DIAGNOSIS — C20 Malignant neoplasm of rectum: Secondary | ICD-10-CM | POA: Diagnosis not present

## 2023-03-10 DIAGNOSIS — R6889 Other general symptoms and signs: Secondary | ICD-10-CM | POA: Diagnosis not present

## 2023-03-16 DIAGNOSIS — Z5111 Encounter for antineoplastic chemotherapy: Secondary | ICD-10-CM | POA: Diagnosis not present

## 2023-03-16 DIAGNOSIS — C20 Malignant neoplasm of rectum: Secondary | ICD-10-CM | POA: Diagnosis not present

## 2023-03-30 DIAGNOSIS — Z5111 Encounter for antineoplastic chemotherapy: Secondary | ICD-10-CM | POA: Diagnosis not present

## 2023-03-30 DIAGNOSIS — C20 Malignant neoplasm of rectum: Secondary | ICD-10-CM | POA: Diagnosis not present

## 2023-04-13 DIAGNOSIS — R21 Rash and other nonspecific skin eruption: Secondary | ICD-10-CM | POA: Diagnosis not present

## 2023-04-13 DIAGNOSIS — C20 Malignant neoplasm of rectum: Secondary | ICD-10-CM | POA: Diagnosis not present

## 2023-04-13 DIAGNOSIS — Z5111 Encounter for antineoplastic chemotherapy: Secondary | ICD-10-CM | POA: Diagnosis not present

## 2023-04-27 DIAGNOSIS — R197 Diarrhea, unspecified: Secondary | ICD-10-CM | POA: Diagnosis not present

## 2023-04-27 DIAGNOSIS — T451X5A Adverse effect of antineoplastic and immunosuppressive drugs, initial encounter: Secondary | ICD-10-CM | POA: Diagnosis not present

## 2023-04-27 DIAGNOSIS — C20 Malignant neoplasm of rectum: Secondary | ICD-10-CM | POA: Diagnosis not present

## 2023-04-27 DIAGNOSIS — Z5111 Encounter for antineoplastic chemotherapy: Secondary | ICD-10-CM | POA: Diagnosis not present

## 2023-04-27 DIAGNOSIS — L270 Generalized skin eruption due to drugs and medicaments taken internally: Secondary | ICD-10-CM | POA: Diagnosis not present

## 2023-05-11 DIAGNOSIS — Z5111 Encounter for antineoplastic chemotherapy: Secondary | ICD-10-CM | POA: Diagnosis not present

## 2023-05-11 DIAGNOSIS — C20 Malignant neoplasm of rectum: Secondary | ICD-10-CM | POA: Diagnosis not present

## 2023-05-11 DIAGNOSIS — R630 Anorexia: Secondary | ICD-10-CM | POA: Diagnosis not present

## 2023-05-18 ENCOUNTER — Ambulatory Visit (INDEPENDENT_AMBULATORY_CARE_PROVIDER_SITE_OTHER): Payer: Medicare HMO | Admitting: Internal Medicine

## 2023-05-18 ENCOUNTER — Encounter: Payer: Self-pay | Admitting: Internal Medicine

## 2023-05-18 VITALS — BP 120/74 | HR 94 | Ht 70.0 in | Wt 170.4 lb

## 2023-05-18 DIAGNOSIS — E66811 Obesity, class 1: Secondary | ICD-10-CM

## 2023-05-18 DIAGNOSIS — E1165 Type 2 diabetes mellitus with hyperglycemia: Secondary | ICD-10-CM

## 2023-05-18 DIAGNOSIS — Z794 Long term (current) use of insulin: Secondary | ICD-10-CM

## 2023-05-18 DIAGNOSIS — E1159 Type 2 diabetes mellitus with other circulatory complications: Secondary | ICD-10-CM | POA: Diagnosis not present

## 2023-05-18 DIAGNOSIS — E785 Hyperlipidemia, unspecified: Secondary | ICD-10-CM | POA: Diagnosis not present

## 2023-05-18 LAB — POCT GLYCOSYLATED HEMOGLOBIN (HGB A1C): Hemoglobin A1C: 6.6 % — AB (ref 4.0–5.6)

## 2023-05-18 NOTE — Patient Instructions (Addendum)
Please continue off Lantus.  Please stop at the lab.  Please return in 4-6 months with your sugar log.
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
## 2023-05-18 NOTE — Addendum Note (Signed)
Addended by: Pollie Meyer on: 05/18/2023 04:33 PM   Modules accepted: Orders

## 2023-05-19 LAB — MICROALBUMIN / CREATININE URINE RATIO
Creatinine,U: 222.2 mg/dL
Microalb Creat Ratio: 0.9 mg/g (ref 0.0–30.0)
Microalb, Ur: 2.1 mg/dL — ABNORMAL HIGH (ref 0.0–1.9)

## 2023-05-25 DIAGNOSIS — Z5111 Encounter for antineoplastic chemotherapy: Secondary | ICD-10-CM | POA: Diagnosis not present

## 2023-05-25 DIAGNOSIS — R630 Anorexia: Secondary | ICD-10-CM | POA: Diagnosis not present

## 2023-05-25 DIAGNOSIS — C20 Malignant neoplasm of rectum: Secondary | ICD-10-CM | POA: Diagnosis not present

## 2023-05-26 ENCOUNTER — Ambulatory Visit: Payer: Medicare HMO | Attending: Cardiology | Admitting: Cardiology

## 2023-05-26 ENCOUNTER — Encounter: Payer: Self-pay | Admitting: Cardiology

## 2023-05-26 ENCOUNTER — Ambulatory Visit: Payer: Medicare HMO | Attending: Cardiology

## 2023-05-26 VITALS — BP 116/60 | HR 68 | Ht 70.0 in | Wt 176.2 lb

## 2023-05-26 DIAGNOSIS — I639 Cerebral infarction, unspecified: Secondary | ICD-10-CM

## 2023-05-26 DIAGNOSIS — R030 Elevated blood-pressure reading, without diagnosis of hypertension: Secondary | ICD-10-CM

## 2023-05-26 DIAGNOSIS — E785 Hyperlipidemia, unspecified: Secondary | ICD-10-CM

## 2023-05-26 DIAGNOSIS — R001 Bradycardia, unspecified: Secondary | ICD-10-CM | POA: Diagnosis not present

## 2023-05-26 NOTE — Patient Instructions (Signed)
Medication Instructions:  Your physician recommends that you continue on your current medications as directed. Please refer to the Current Medication list given to you today.  *If you need a refill on your cardiac medications before your next appointment, please call your pharmacy*   Lab Work: None If you have labs (blood work) drawn today and your tests are completely normal, you will receive your results only by: MyChart Message (if you have MyChart) OR A paper copy in the mail If you have any lab test that is abnormal or we need to change your treatment, we will call you to review the results.   Testing/Procedures: A zio monitor was ordered today. It will remain on for 7 days. You will then return monitor and event diary in provided box. It takes 1-2 weeks for report to be downloaded and returned to [place]. We will call you with the results. If monitor falls off or has orange flashing light, please call Zio for further instructions.     Follow-Up: At [facility], you and your health needs are our priority.  As part of our continuing mission to provide you with exceptional heart care, we have created designated Provider Care Teams.  These Care Teams include your primary Cardiologist (physician) and Advanced Practice Providers (APPs -  Physician Assistants and Nurse Practitioners) who all work together to provide you with the care you need, when you need it.  We recommend signing up for the patient portal called "MyChart".  Sign up information is provided on this After Visit Summary.  MyChart is used to connect with patients for Virtual Visits (Telemedicine).  Patients are able to view lab/test results, encounter notes, upcoming appointments, etc.  Non-urgent messages can be sent to your provider as well.   To learn more about what you can do with MyChart, go to [email or web address].    Your next appointment:   6 month(s)  Provider:   Norman Herrlich, MD    Other  Instructions None

## 2023-05-26 NOTE — Addendum Note (Signed)
Addended by: Roxanne Mins I on: 05/26/2023 03:04 PM   Modules accepted: Orders

## 2023-05-30 DIAGNOSIS — E113292 Type 2 diabetes mellitus with mild nonproliferative diabetic retinopathy without macular edema, left eye: Secondary | ICD-10-CM | POA: Diagnosis not present

## 2023-05-30 LAB — HM DIABETES EYE EXAM

## 2023-06-06 ENCOUNTER — Encounter: Payer: Self-pay | Admitting: Nurse Practitioner

## 2023-06-06 DIAGNOSIS — I700 Atherosclerosis of aorta: Secondary | ICD-10-CM | POA: Diagnosis not present

## 2023-06-06 DIAGNOSIS — C20 Malignant neoplasm of rectum: Secondary | ICD-10-CM | POA: Diagnosis not present

## 2023-06-06 DIAGNOSIS — C787 Secondary malignant neoplasm of liver and intrahepatic bile duct: Secondary | ICD-10-CM | POA: Diagnosis not present

## 2023-06-06 DIAGNOSIS — R918 Other nonspecific abnormal finding of lung field: Secondary | ICD-10-CM | POA: Diagnosis not present

## 2023-06-06 NOTE — Telephone Encounter (Signed)
Care team updated and letter sent for eye exam notes.

## 2023-06-08 DIAGNOSIS — C20 Malignant neoplasm of rectum: Secondary | ICD-10-CM | POA: Diagnosis not present

## 2023-06-12 DIAGNOSIS — I639 Cerebral infarction, unspecified: Secondary | ICD-10-CM | POA: Diagnosis not present

## 2023-06-12 DIAGNOSIS — R001 Bradycardia, unspecified: Secondary | ICD-10-CM | POA: Diagnosis not present

## 2023-06-15 ENCOUNTER — Encounter: Payer: Self-pay | Admitting: Cardiology

## 2023-09-21 ENCOUNTER — Ambulatory Visit: Payer: Medicare HMO | Admitting: Internal Medicine

## 2023-11-16 ENCOUNTER — Encounter: Payer: Self-pay | Admitting: Internal Medicine

## 2023-11-16 ENCOUNTER — Ambulatory Visit: Payer: Medicare HMO | Admitting: Internal Medicine

## 2023-11-16 VITALS — BP 122/70 | HR 71 | Ht 70.0 in | Wt 180.6 lb

## 2023-11-16 DIAGNOSIS — E785 Hyperlipidemia, unspecified: Secondary | ICD-10-CM | POA: Diagnosis not present

## 2023-11-16 DIAGNOSIS — E663 Overweight: Secondary | ICD-10-CM

## 2023-11-16 DIAGNOSIS — E1165 Type 2 diabetes mellitus with hyperglycemia: Secondary | ICD-10-CM | POA: Diagnosis not present

## 2023-11-16 DIAGNOSIS — E1159 Type 2 diabetes mellitus with other circulatory complications: Secondary | ICD-10-CM

## 2023-11-16 LAB — POCT GLYCOSYLATED HEMOGLOBIN (HGB A1C): Hemoglobin A1C: 6.9 % — AB (ref 4.0–5.6)
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
## 2023-11-16 NOTE — Patient Instructions (Addendum)
 Please continue off Lantus .  Stop snacking at night.  Please stop at the lab.  Please return in 4 months with your sugar log.

## 2023-11-17 ENCOUNTER — Encounter: Payer: Self-pay | Admitting: Internal Medicine

## 2023-11-17 LAB — MICROALBUMIN / CREATININE URINE RATIO
Creatinine, Urine: 167 mg/dL (ref 20–320)
Microalb Creat Ratio: 6 mg/g{creat} (ref <30)
Microalb, Ur: 1 mg/dL

## 2023-11-24 ENCOUNTER — Encounter: Payer: Self-pay | Admitting: Gastroenterology

## 2023-12-05 ENCOUNTER — Ambulatory Visit: Payer: Medicare HMO | Admitting: Cardiology

## 2023-12-28 ENCOUNTER — Encounter: Payer: Self-pay | Admitting: Cardiology

## 2024-01-29 ENCOUNTER — Ambulatory Visit: Admitting: Cardiology

## 2024-03-19 ENCOUNTER — Ambulatory Visit: Admitting: Internal Medicine

## 2024-03-29 ENCOUNTER — Ambulatory Visit: Admitting: Cardiology

## 2024-04-19 ENCOUNTER — Ambulatory Visit: Admitting: Internal Medicine

## 2024-04-19 DIAGNOSIS — T24202A Burn of second degree of unspecified site of left lower limb, except ankle and foot, initial encounter: Secondary | ICD-10-CM | POA: Insufficient documentation

## 2024-04-19 DIAGNOSIS — I10 Essential (primary) hypertension: Secondary | ICD-10-CM | POA: Insufficient documentation

## 2024-04-19 DIAGNOSIS — E119 Type 2 diabetes mellitus without complications: Secondary | ICD-10-CM | POA: Insufficient documentation

## 2024-04-19 DIAGNOSIS — T23209A Burn of second degree of unspecified hand, unspecified site, initial encounter: Secondary | ICD-10-CM | POA: Insufficient documentation

## 2024-04-19 DIAGNOSIS — E785 Hyperlipidemia, unspecified: Secondary | ICD-10-CM | POA: Insufficient documentation

## 2024-04-19 DIAGNOSIS — I4891 Unspecified atrial fibrillation: Secondary | ICD-10-CM | POA: Insufficient documentation

## 2024-04-19 DIAGNOSIS — I69351 Hemiplegia and hemiparesis following cerebral infarction affecting right dominant side: Secondary | ICD-10-CM | POA: Insufficient documentation

## 2024-04-19 NOTE — Progress Notes (Signed)
  Cardiology Office Note   Date:  04/23/2024  ID:  Cisco, Kindt [birth year], MRN [id number] PCP: Hanford Powell BRAVO, NP  [facility] Providers Cardiologist:  Redell Leiter, MD { Click to update primary MD,subspecialty MD or APP then REFRESH:1}    History of Present Illness Robert Brewer is a 78 y.o. male with a past history of stroke, hypertension, DM2, history of metastatic rectal cancer, atrial fibrillation, dyslipidemia.  05/26/2023 monitor average heart rate 64 bpm, predominant rhythm was sinus, 5 episodes of SVT occurred, no episodes of atrial fibrillation or flutter, isolated SVE's were frequent at 17.8% 12/17/2018 echo bubble study negative for ASD, EF 60 to 65%, impaired relaxation, mild calcification of the aortic valve  He initially established care with Dr. Leiter in 2020 following a cryptogenic stroke which was felt to possibly be related to atrial fibrillation, was suggested he undergo ILR implantation however he ultimately would not amenable to this.  Most recently was evaluated by Dr. Leiter on 05/26/2023, had a few episodes where his heart rate had been noted to be in the 30s to 40s, monitor was arranged revealing average heart rate 64 bpm, SVE's were frequent at 17.8%.  ROS: ***  Studies Reviewed      *** Risk Assessment/Calculations {Does this patient have ATRIAL FIBRILLATION?:[phone]}         Physical Exam VS:  BP 132/74   Pulse 68   Ht 5' 11.6 (1.819 m)   Wt 185 lb 6.4 oz (84.1 kg)   SpO2 96%   BMI 25.43 kg/m        Wt Readings from Last 3 Encounters:  04/23/24 185 lb 6.4 oz (84.1 kg)  11/16/23 180 lb 9.6 oz (81.9 kg)  05/26/23 176 lb 3.2 oz (79.9 kg)    GEN: Well nourished, well developed in no acute distress NECK: No JVD; No carotid bruits CARDIAC: ***RRR, no murmurs, rubs, gallops RESPIRATORY:  Clear to auscultation without rales, wheezing or rhonchi  ABDOMEN: Soft, non-tender, non-distended EXTREMITIES:  No edema; No deformity    ASSESSMENT AND PLAN ***    {Are you ordering a CV Procedure (e.g. stress test, cath, DCCV, TEE, etc)?   Press F2        :[id number]}  Dispo: ***  Signed, Delon JAYSON Hoover, NP

## 2024-04-23 ENCOUNTER — Other Ambulatory Visit: Payer: Self-pay

## 2024-04-23 ENCOUNTER — Ambulatory Visit: Attending: Cardiology | Admitting: Cardiology

## 2024-04-23 ENCOUNTER — Encounter: Payer: Self-pay | Admitting: Cardiology

## 2024-04-23 VITALS — BP 132/74 | HR 68 | Ht 71.6 in | Wt 185.4 lb

## 2024-04-23 DIAGNOSIS — Z794 Long term (current) use of insulin: Secondary | ICD-10-CM | POA: Diagnosis not present

## 2024-04-23 DIAGNOSIS — E785 Hyperlipidemia, unspecified: Secondary | ICD-10-CM

## 2024-04-23 DIAGNOSIS — I639 Cerebral infarction, unspecified: Secondary | ICD-10-CM | POA: Diagnosis not present

## 2024-04-23 DIAGNOSIS — E1149 Type 2 diabetes mellitus with other diabetic neurological complication: Secondary | ICD-10-CM

## 2024-04-23 NOTE — Patient Instructions (Addendum)
 Medication Instructions:  No changes  *If you need a refill on your cardiac medications before your next appointment, please call your pharmacy*  Lab Work:   If you have labs (blood work) drawn today and your tests are completely normal, you will receive your results only by: MyChart Message (if you have MyChart) OR A paper copy in the mail If you have any lab test that is abnormal or we need to change your treatment, we will call you to review the results.  Testing/Procedures: none  Follow-Up: At [facility], you and your health needs are our priority.  As part of our continuing mission to provide you with exceptional heart care, our providers are all part of one team.  This team includes your primary Cardiologist (physician) and Advanced Practice Providers or APPs (Physician Assistants and Nurse Practitioners) who all work together to provide you with the care you need, when you need it.  Your next appointment:   6 month(s)  Provider:   Redell Leiter, MD    We recommend signing up for the patient portal called MyChart.  Sign up information is provided on this After Visit Summary.  MyChart is used to connect with patients for Virtual Visits (Telemedicine).  Patients are able to view lab/test results, encounter notes, upcoming appointments, etc.  Non-urgent messages can be sent to your provider as well.   To learn more about what you can do with MyChart, go to [email or web address].   Other Instructions

## 2024-05-07 ENCOUNTER — Ambulatory Visit: Admitting: Internal Medicine

## 2024-06-13 ENCOUNTER — Encounter: Payer: Self-pay | Admitting: Internal Medicine

## 2024-06-13 ENCOUNTER — Ambulatory Visit: Admitting: Internal Medicine

## 2024-06-13 VITALS — BP 138/70 | HR 78 | Ht 71.6 in | Wt 191.2 lb

## 2024-06-13 DIAGNOSIS — E663 Overweight: Secondary | ICD-10-CM

## 2024-06-13 DIAGNOSIS — E785 Hyperlipidemia, unspecified: Secondary | ICD-10-CM

## 2024-06-13 DIAGNOSIS — E1159 Type 2 diabetes mellitus with other circulatory complications: Secondary | ICD-10-CM | POA: Diagnosis not present

## 2024-06-13 DIAGNOSIS — E1165 Type 2 diabetes mellitus with hyperglycemia: Secondary | ICD-10-CM | POA: Diagnosis not present

## 2024-06-13 LAB — POCT GLYCOSYLATED HEMOGLOBIN (HGB A1C): Hemoglobin A1C: 7.4 % — AB (ref 4.0–5.6)

## 2024-06-13 MED ORDER — INSULIN PEN NEEDLE 32G X 4 MM MISC: 3 refills | Status: AC

## 2024-06-13 MED ORDER — LANTUS SOLOSTAR 100 UNIT/ML ~~LOC~~ SOPN: 12.0000 [IU] | PEN_INJECTOR | Freq: Every day | SUBCUTANEOUS | 3 refills | Status: AC

## 2024-06-13 NOTE — Patient Instructions (Addendum)
 Please restart Lantus  12 units at bedtime and increase the dose by 2 units every 2 days until sugars in am are <130.  Check sugars later in the day and at bedtime, also.  Please return in 3-4 months with your sugar log.
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
## 2024-09-12 ENCOUNTER — Ambulatory Visit: Admitting: Internal Medicine

## 2024-09-13 ENCOUNTER — Ambulatory Visit: Admitting: Internal Medicine
